# Patient Record
Sex: Male | Born: 1935 | Race: White | Hispanic: No | Smoking: Former smoker
Health system: Southern US, Community
[De-identification: ages and names within clinical notes are randomized; demographics above are authoritative.]

## PROBLEM LIST (undated history)

## (undated) DIAGNOSIS — I639 Cerebral infarction, unspecified: Secondary | ICD-10-CM

## (undated) DIAGNOSIS — I10 Essential (primary) hypertension: Secondary | ICD-10-CM

## (undated) DIAGNOSIS — F32A Depression, unspecified: Secondary | ICD-10-CM

## (undated) DIAGNOSIS — B019 Varicella without complication: Secondary | ICD-10-CM

## (undated) DIAGNOSIS — R0600 Dyspnea, unspecified: Secondary | ICD-10-CM

## (undated) DIAGNOSIS — I714 Abdominal aortic aneurysm, without rupture, unspecified: Secondary | ICD-10-CM

## (undated) DIAGNOSIS — E039 Hypothyroidism, unspecified: Secondary | ICD-10-CM

## (undated) DIAGNOSIS — I2510 Atherosclerotic heart disease of native coronary artery without angina pectoris: Secondary | ICD-10-CM

## (undated) DIAGNOSIS — F329 Major depressive disorder, single episode, unspecified: Secondary | ICD-10-CM

## (undated) DIAGNOSIS — J439 Emphysema, unspecified: Secondary | ICD-10-CM

## (undated) DIAGNOSIS — E7800 Pure hypercholesterolemia, unspecified: Secondary | ICD-10-CM

## (undated) DIAGNOSIS — I350 Nonrheumatic aortic (valve) stenosis: Secondary | ICD-10-CM

## (undated) DIAGNOSIS — M1990 Unspecified osteoarthritis, unspecified site: Secondary | ICD-10-CM

## (undated) HISTORY — PX: CORONARY ANGIOPLASTY WITH STENT PLACEMENT: SHX49

## (undated) HISTORY — DX: Atherosclerotic heart disease of native coronary artery without angina pectoris: I25.10

## (undated) HISTORY — DX: Essential (primary) hypertension: I10

## (undated) HISTORY — DX: Hypothyroidism, unspecified: E03.9

## (undated) HISTORY — DX: Cerebral infarction, unspecified: I63.9

## (undated) HISTORY — DX: Pure hypercholesterolemia, unspecified: E78.00

## (undated) HISTORY — DX: Unspecified osteoarthritis, unspecified site: M19.90

## (undated) HISTORY — DX: Dyspnea, unspecified: R06.00

## (undated) HISTORY — DX: Emphysema, unspecified: J43.9

## (undated) HISTORY — DX: Depression, unspecified: F32.A

## (undated) HISTORY — DX: Major depressive disorder, single episode, unspecified: F32.9

## (undated) HISTORY — DX: Varicella without complication: B01.9

---

## 1999-05-07 ENCOUNTER — Encounter: Payer: Self-pay | Admitting: Internal Medicine

## 1999-05-07 ENCOUNTER — Inpatient Hospital Stay (HOSPITAL_COMMUNITY): Admission: EM | Admit: 1999-05-07 | Discharge: 1999-05-10 | Payer: Self-pay | Admitting: Emergency Medicine

## 1999-11-07 ENCOUNTER — Inpatient Hospital Stay (HOSPITAL_COMMUNITY): Admission: EM | Admit: 1999-11-07 | Discharge: 1999-11-09 | Payer: Self-pay | Admitting: Emergency Medicine

## 1999-11-07 ENCOUNTER — Encounter: Payer: Self-pay | Admitting: Emergency Medicine

## 1999-11-14 ENCOUNTER — Emergency Department (HOSPITAL_COMMUNITY): Admission: EM | Admit: 1999-11-14 | Discharge: 1999-11-14 | Payer: Self-pay | Admitting: Emergency Medicine

## 1999-11-15 ENCOUNTER — Inpatient Hospital Stay (HOSPITAL_COMMUNITY): Admission: EM | Admit: 1999-11-15 | Discharge: 1999-11-18 | Payer: Self-pay | Admitting: Emergency Medicine

## 1999-11-15 ENCOUNTER — Encounter: Payer: Self-pay | Admitting: Emergency Medicine

## 2000-12-23 ENCOUNTER — Encounter: Admission: RE | Admit: 2000-12-23 | Discharge: 2000-12-23 | Payer: Self-pay | Admitting: Family Medicine

## 2000-12-23 ENCOUNTER — Encounter: Payer: Self-pay | Admitting: Family Medicine

## 2001-08-11 ENCOUNTER — Encounter: Admission: RE | Admit: 2001-08-11 | Discharge: 2001-08-11 | Payer: Self-pay | Admitting: Family Medicine

## 2001-08-11 ENCOUNTER — Encounter: Payer: Self-pay | Admitting: Family Medicine

## 2001-08-18 ENCOUNTER — Encounter: Payer: Self-pay | Admitting: Family Medicine

## 2001-08-18 ENCOUNTER — Encounter: Admission: RE | Admit: 2001-08-18 | Discharge: 2001-08-18 | Payer: Self-pay | Admitting: Family Medicine

## 2003-06-09 ENCOUNTER — Inpatient Hospital Stay (HOSPITAL_COMMUNITY): Admission: EM | Admit: 2003-06-09 | Discharge: 2003-06-14 | Payer: Self-pay | Admitting: Emergency Medicine

## 2003-06-09 ENCOUNTER — Encounter: Payer: Self-pay | Admitting: Emergency Medicine

## 2003-06-13 ENCOUNTER — Encounter: Payer: Self-pay | Admitting: Cardiology

## 2003-06-29 ENCOUNTER — Ambulatory Visit (HOSPITAL_COMMUNITY): Admission: RE | Admit: 2003-06-29 | Discharge: 2003-06-29 | Payer: Self-pay | Admitting: Internal Medicine

## 2003-09-27 ENCOUNTER — Encounter: Admission: AD | Admit: 2003-09-27 | Discharge: 2003-09-27 | Payer: Self-pay | Admitting: Dentistry

## 2003-10-03 ENCOUNTER — Ambulatory Visit (HOSPITAL_COMMUNITY): Admission: RE | Admit: 2003-10-03 | Discharge: 2003-10-03 | Payer: Self-pay | Admitting: Cardiology

## 2003-10-12 ENCOUNTER — Ambulatory Visit (HOSPITAL_COMMUNITY): Admission: RE | Admit: 2003-10-12 | Discharge: 2003-10-12 | Payer: Self-pay | Admitting: Dentistry

## 2005-05-05 ENCOUNTER — Ambulatory Visit: Payer: Self-pay | Admitting: Cardiology

## 2005-07-01 ENCOUNTER — Ambulatory Visit: Payer: Self-pay | Admitting: *Deleted

## 2005-07-18 ENCOUNTER — Encounter (INDEPENDENT_AMBULATORY_CARE_PROVIDER_SITE_OTHER): Payer: Self-pay | Admitting: *Deleted

## 2005-07-18 LAB — CONVERTED CEMR LAB
ALT: 14 units/L
AST: 25 units/L
Albumin: 4.3 g/dL
Alkaline Phosphatase: 71 units/L
Total Protein: 7.6 g/dL
Triglycerides: 112 mg/dL

## 2006-03-10 ENCOUNTER — Emergency Department (HOSPITAL_COMMUNITY): Admission: EM | Admit: 2006-03-10 | Discharge: 2006-03-10 | Payer: Self-pay | Admitting: Family Medicine

## 2006-05-01 ENCOUNTER — Encounter: Payer: Self-pay | Admitting: Cardiology

## 2006-05-12 ENCOUNTER — Ambulatory Visit: Payer: Self-pay | Admitting: Cardiology

## 2006-05-14 ENCOUNTER — Ambulatory Visit (HOSPITAL_COMMUNITY): Admission: RE | Admit: 2006-05-14 | Discharge: 2006-05-14 | Payer: Self-pay | Admitting: Cardiology

## 2006-05-14 ENCOUNTER — Ambulatory Visit: Payer: Self-pay | Admitting: Cardiology

## 2006-10-20 ENCOUNTER — Ambulatory Visit: Payer: Self-pay | Admitting: Cardiology

## 2009-02-22 DIAGNOSIS — I2510 Atherosclerotic heart disease of native coronary artery without angina pectoris: Secondary | ICD-10-CM | POA: Insufficient documentation

## 2009-02-22 DIAGNOSIS — R0602 Shortness of breath: Secondary | ICD-10-CM | POA: Insufficient documentation

## 2009-02-22 DIAGNOSIS — I6350 Cerebral infarction due to unspecified occlusion or stenosis of unspecified cerebral artery: Secondary | ICD-10-CM | POA: Insufficient documentation

## 2009-02-22 DIAGNOSIS — E039 Hypothyroidism, unspecified: Secondary | ICD-10-CM | POA: Insufficient documentation

## 2009-02-22 DIAGNOSIS — E7800 Pure hypercholesterolemia, unspecified: Secondary | ICD-10-CM | POA: Insufficient documentation

## 2009-08-09 ENCOUNTER — Encounter: Payer: Self-pay | Admitting: Cardiology

## 2009-08-10 ENCOUNTER — Encounter: Payer: Self-pay | Admitting: Cardiology

## 2009-08-13 ENCOUNTER — Encounter (INDEPENDENT_AMBULATORY_CARE_PROVIDER_SITE_OTHER): Payer: Self-pay | Admitting: *Deleted

## 2009-08-13 ENCOUNTER — Ambulatory Visit (HOSPITAL_COMMUNITY): Admission: RE | Admit: 2009-08-13 | Discharge: 2009-08-13 | Payer: Self-pay | Admitting: Cardiovascular Disease

## 2009-08-13 ENCOUNTER — Ambulatory Visit: Payer: Self-pay | Admitting: Cardiovascular Disease

## 2009-08-13 DIAGNOSIS — I739 Peripheral vascular disease, unspecified: Secondary | ICD-10-CM | POA: Insufficient documentation

## 2009-08-13 DIAGNOSIS — I359 Nonrheumatic aortic valve disorder, unspecified: Secondary | ICD-10-CM | POA: Insufficient documentation

## 2009-08-13 DIAGNOSIS — R0989 Other specified symptoms and signs involving the circulatory and respiratory systems: Secondary | ICD-10-CM | POA: Insufficient documentation

## 2009-08-17 ENCOUNTER — Encounter (INDEPENDENT_AMBULATORY_CARE_PROVIDER_SITE_OTHER): Payer: Self-pay | Admitting: *Deleted

## 2009-08-17 ENCOUNTER — Encounter: Payer: Self-pay | Admitting: Cardiovascular Disease

## 2009-08-17 ENCOUNTER — Ambulatory Visit: Payer: Self-pay | Admitting: Cardiology

## 2009-08-17 ENCOUNTER — Ambulatory Visit (HOSPITAL_COMMUNITY): Admission: RE | Admit: 2009-08-17 | Discharge: 2009-08-17 | Payer: Self-pay | Admitting: Cardiovascular Disease

## 2009-08-17 LAB — CONVERTED CEMR LAB
CO2: 31 meq/L
Calcium: 9.7 mg/dL
Creatinine, Ser: 1.19 mg/dL
Glucose, Bld: 76 mg/dL

## 2009-08-22 ENCOUNTER — Inpatient Hospital Stay (HOSPITAL_BASED_OUTPATIENT_CLINIC_OR_DEPARTMENT_OTHER): Admission: RE | Admit: 2009-08-22 | Discharge: 2009-08-22 | Payer: Self-pay | Admitting: Cardiovascular Disease

## 2009-08-22 ENCOUNTER — Ambulatory Visit: Payer: Self-pay | Admitting: Cardiovascular Disease

## 2009-08-28 ENCOUNTER — Telehealth (INDEPENDENT_AMBULATORY_CARE_PROVIDER_SITE_OTHER): Payer: Self-pay | Admitting: *Deleted

## 2009-09-11 ENCOUNTER — Telehealth (INDEPENDENT_AMBULATORY_CARE_PROVIDER_SITE_OTHER): Payer: Self-pay | Admitting: *Deleted

## 2009-09-13 ENCOUNTER — Telehealth (INDEPENDENT_AMBULATORY_CARE_PROVIDER_SITE_OTHER): Payer: Self-pay

## 2009-09-14 ENCOUNTER — Ambulatory Visit: Payer: Self-pay | Admitting: Cardiovascular Disease

## 2009-09-14 ENCOUNTER — Observation Stay (HOSPITAL_COMMUNITY): Admission: AD | Admit: 2009-09-14 | Discharge: 2009-09-15 | Payer: Self-pay | Admitting: Cardiovascular Disease

## 2009-09-21 ENCOUNTER — Encounter (INDEPENDENT_AMBULATORY_CARE_PROVIDER_SITE_OTHER): Payer: Self-pay | Admitting: *Deleted

## 2009-09-25 ENCOUNTER — Telehealth (INDEPENDENT_AMBULATORY_CARE_PROVIDER_SITE_OTHER): Payer: Self-pay | Admitting: *Deleted

## 2009-10-01 ENCOUNTER — Encounter (INDEPENDENT_AMBULATORY_CARE_PROVIDER_SITE_OTHER): Payer: Self-pay | Admitting: *Deleted

## 2009-10-01 LAB — CONVERTED CEMR LAB
BUN: 13 mg/dL
CO2: 31 meq/L
Calcium: 9.7 mg/dL
Creatinine, Ser: 1.19 mg/dL
Glucose, Bld: 76 mg/dL

## 2009-10-05 ENCOUNTER — Encounter (INDEPENDENT_AMBULATORY_CARE_PROVIDER_SITE_OTHER): Payer: Self-pay | Admitting: *Deleted

## 2009-10-24 ENCOUNTER — Ambulatory Visit: Payer: Self-pay | Admitting: Cardiovascular Disease

## 2010-09-08 DIAGNOSIS — I350 Nonrheumatic aortic (valve) stenosis: Secondary | ICD-10-CM

## 2010-09-08 HISTORY — DX: Nonrheumatic aortic (valve) stenosis: I35.0

## 2010-09-17 ENCOUNTER — Encounter: Payer: Self-pay | Admitting: Cardiovascular Disease

## 2010-09-18 ENCOUNTER — Ambulatory Visit: Admit: 2010-09-18 | Payer: Self-pay

## 2010-09-18 ENCOUNTER — Ambulatory Visit (HOSPITAL_COMMUNITY): Admission: RE | Admit: 2010-09-18 | Payer: Self-pay | Source: Home / Self Care | Admitting: Cardiovascular Disease

## 2010-10-10 NOTE — Miscellaneous (Signed)
Summary: LABS BMP,PT,INR 08/17/09  Clinical Lists Changes  Observations: Added new observation of CALCIUM: 9.7 mg/dL (03/47/4259 56:38) Added new observation of CREATININE: 1.19 mg/dL (75/64/3329 51:88) Added new observation of BUN: 13 mg/dL (41/66/0630 16:01) Added new observation of BG RANDOM: 76 mg/dL (09/32/3557 32:20) Added new observation of CO2 PLSM/SER: 31 meq/L (10/01/2009 11:59) Added new observation of CL SERUM: 103 meq/L (10/01/2009 11:59) Added new observation of K SERUM: 4.5 meq/L (10/01/2009 11:59) Added new observation of NA: 140 meq/L (10/01/2009 11:59) Added new observation of INR: 1.08  (10/01/2009 11:59) Added new observation of PT PATIENT: 13.9 s (10/01/2009 11:59) Added new observation of PTT PATIENT: 28 s (10/01/2009 11:59)

## 2010-10-10 NOTE — Letter (Signed)
Summary: Generic Letter  Architectural technologist at [facility]  [address] [place] [address]   Phone: [phone]  Fax: [phone]        September 21, 2009 MRN: [id number]    Maxwell Copeland [address] [place], [place]  [address]   To Whom It May Concern:        Mr. Dommer underwent a cardiac catherization on January 8th.  He is status post elective cutting balloon angioplasty/drug eluting stenting of proximal circumflex artery, secondary to instent restenosis. He will need plavix therapy for at least 18-24 months or longer depending on his progress.         Sincerely,  Dr. Reese Bing  This letter has been electronically signed by your physician.

## 2010-10-10 NOTE — Progress Notes (Signed)
Summary: Questions  Phone Note Call from Patient Call back at [phone]   Caller: Daughter (Maxwell Copeland) Reason for Call: Talk to Nurse Summary of Call: Needs RX for [facility] and [facility] in [facility] why [facility] is [facility]/ Pt's daughter has concern about him having balloon surgery on Friday and has not had his Plavix for about 5 or 6 days/pls return call to patient's daughter @ above phone number/tg  Patient daughter calling again today needs to get a copy of instructions that were giving to her for his balloon surgery. She doesnt know were to go or who is doing surgery. Please call her back today. Initial call taken by: Raechel Ache [facility],  September 11, 2009 2:19 PM  Follow-up for Phone Call        Dr. Eden Emms  I have previously sent the office note but apparently they want a detailed letter from you. Thank you for attending to this matter Follow-up by: Teressa Lower RN,  September 12, 2009 9:37 AM

## 2010-10-10 NOTE — Progress Notes (Signed)
**Note De-Identified Maxwell Copeland Obfuscation** Summary: Plavix  Phone Note Other Incoming   Summary of Call: No answer, left message. (needs to be advised to restart Plavix.) Initial call taken by: Larita Fife Havilah Topor LPN,  September 13, 2009 9:20 AM  Follow-up for Phone Call        No answer,left message. Follow-up by: Larita Fife Kiearra Oyervides LPN,  September 14, 2009 9:02 AM    Additional Follow-up for Phone Call Additional follow up Details #2::    I called June, patient's daughter @ [phone], to give Plavix instructions and was advised that Dr. Earmon [place] office had already contacted her on this matter. Patient is having surgery today at 12:30. Follow-up by: Larita Fife Katilin Raynes LPN,  September 14, 2009 10:21 AM

## 2010-10-10 NOTE — Progress Notes (Signed)
Summary: [facility] paperwork  Phone Note Call from Patient Call back at [phone]   Caller: pt daughter june Reason for Call: Talk to Nurse Summary of Call: Calling because [facility] is still giving her run around about her dads paper work. She was talking with Erskin Zinda and want s to know if she will re-fax all information again Initial call taken by: Faythe Ghee,  September 25, 2009 12:54 PM  Follow-up for Phone Call        [facility] and also faxed to pt's daughter Follow-up by: Teressa Lower RN,  September 25, 2009 1:39 PM    Prescriptions: PLAVIX 75 MG TABS (CLOPIDOGREL BISULFATE) take 1 tablet by mouth once daily  #90 x 3   Entered by:   Teressa Lower RN   Authorized by:   Kathlen Brunswick, MD, [facility]   Signed by:   Teressa Lower RN on 09/25/2009   Method used:   Print then Give to Patient   RxID:   [id number]

## 2010-10-10 NOTE — Miscellaneous (Signed)
Summary: labs pt,inr,bmp,08/17/09  Clinical Lists Changes  Observations: Added new observation of CALCIUM: 9.7 mg/dL (81/19/1478 29:56) Added new observation of CREATININE: 1.19 mg/dL (21/30/8657 84:69) Added new observation of BUN: 13 mg/dL (62/95/2841 32:44) Added new observation of BG RANDOM: 76 mg/dL (09/10/7251 66:44) Added new observation of CO2 PLSM/SER: 31 meq/L (08/17/2009 12:59) Added new observation of CL SERUM: 103 meq/L (08/17/2009 12:59) Added new observation of K SERUM: 4.5 meq/L (08/17/2009 12:59) Added new observation of NA: 140 meq/L (08/17/2009 12:59) Added new observation of INR: 1.08  (08/17/2009 12:59) Added new observation of PT PATIENT: 13.9 s (08/17/2009 12:59) Added new observation of PTT PATIENT: 28 s (08/17/2009 12:59)

## 2010-10-10 NOTE — Assessment & Plan Note (Signed)
Summary: EPH-POST STENT   Visit Type:  Follow-up Primary Provider:  Dr.Shaw   History of Present Illness: Maxwell Copeland is seen today for F/U of carotid disease, CAD and AS.  He just had reintervention to the circ by Dr Excell Seltzer.  He has moderate AS by echo but only mild at cath.  He has known 40-59% LICA stenosis and needs F/U echo and duplex in November.  He is not having any SSCP.  He needs a script for nitro.  He has limitied activity.  He doesn't smoke but chews Bon[place] Dol[place].  He is compliant with his meds including ASA and Plavix.  Current Problems (verified): 1)  Aortic Valve Disorders  (ICD-424.1) 2)  Carotid Bruit  (ICD-785.9) 3)  Preoperative Examination  (ICD-V72.84) 4)  Pvd  (ICD-443.9) 5)  Cad, Unspecified Site  (ICD-414.00) 6)  Hypercholesterolemia Iia  (ICD-272.0) 7)  Dyspnea  (ICD-786.05) 8)  Hypothyroidism  (ICD-244.9) 9)  Cva  (ICD-434.91)  Current Medications (verified): 1)  Slo-Niacin 500 Mg Cr-Tabs (Niacin) .... Take 1 Tab Daily 2)  Crestor 40 Mg Tabs (Rosuvastatin Calcium) .... Take 1 Tab Daily 3)  Amlodipine Besylate 10 Mg Tabs (Amlodipine Besylate) .... Take 1 Tab Daily 4)  Metoprolol Tartrate 50 Mg Tabs (Metoprolol Tartrate) .... Take 1 Tab Daily 5)  Prilosec 20 Mg Cpdr (Omeprazole) .... Take 1 Tab Daily 6)  Levothroid 112 Mcg Tabs (Levothyroxine Sodium) .... Take 1 Tab Daily 7)  Aspir-Trin 325 Mg Tbec (Aspirin) .... Take 1 Tab Daily 8)  Vitamin E 16109 Unit/ml Oil (Vitamin E) .... Take 1 Tab Daily 9)  Plavix 75 Mg Tabs (Clopidogrel Bisulfate) .... 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  - 40+ yrs pack daily quit about '89 Alcohol Use - yes - occasional Drug Use - no  Review of Systems       Denies fever, malais, weight loss, blurry vision, decreased visual acuity, cough, sputum, SOB, hemoptysis, pleuritic pain, palpitaitons, heartburn, abdominal pain, melena, lower extremity edema, claudication, or rash. All other systems reviewed and negative  Vital Signs:  Patient profile:   77 year old male Weight:      159 pounds Pulse rate:   58 / minute BP sitting:   126 / 75  (right arm)  Vitals Entered By: Dreama Saa, CNA (October 24, 2009 2:30 PM)  Physical Exam  General:  Affect appropriate Healthy:  appears stated age HEENT: normal Neck supple with no adenopathy JVP normalright  bruits no thyromegaly Lungs clear with no wheezing and good diaphragmatic motion Heart:  S1/S2 AS  murmur, no rub, gallop or click PMI normal Abdomen: benighn, BS positve, no tenderness, no AAA no bruit.  No HSM or HJR Distal pulses intact with bilateral femoral bruits No edema Neuro non-focal Skin warm and dry    Impression & Recommendations:  Problem # 1:  CAD, UNSPECIFIED SITE (ICD-414.00)  S/P recent stent ot circ.  Films reviewed.  good result with moderate residual LAD and RCA disease His updated medication list for this problem includes:    Amlodipine Besylate 10 Mg Tabs (Amlodipine besylate) .Marland Kitchen... Take 1 tab daily  Metoprolol Tartrate 50 Mg Tabs (Metoprolol tartrate) .Marland Kitchen... Take 1 tab daily    Aspir-trin 325 Mg Tbec (Aspirin) .Marland Kitchen... Take 1 tab daily    Plavix 75 Mg Tabs (Clopidogrel bisulfate) .Marland Kitchen... Take 1 tablet by mouth once daily  His updated medication list for this problem includes:    Amlodipine Besylate 10 Mg Tabs (Amlodipine besylate) .Marland Kitchen... Take 1 tab daily    Metoprolol Tartrate 50 Mg Tabs (Metoprolol tartrate) .Marland Kitchen... Take 1 tab daily    Aspir-trin 325 Mg Tbec (Aspirin) .Marland Kitchen... Take  1 tab daily    Plavix 75 Mg Tabs (Clopidogrel bisulfate) .Marland Kitchen... Take 1 tablet by mouth once daily  Problem # 2:  AORTIC VALVE DISORDERS (ICD-424.1) F/U echo in November.  Gradient only 17mm Hg at cath His updated medication list for this problem includes:    Metoprolol Tartrate 50 Mg Tabs (Metoprolol tartrate) .Marland Kitchen... Take 1 tab daily  Problem # 3:  CAROTID BRUIT (ICD-785.9) No TIA sysmptoms.  F/U duplex for known right sided disease in November  Problem # 4:  PVD (ICD-443.9) Bilateral femoral bruits with mild claudication.  Walking program.  No Pletal for now given CAD and recent stent  Other Orders: Carotid Duplex (Carotid Duplex)  Patient Instructions: 1)  Your physician recommends that you schedule a follow-up appointment in: November 2011, tests prior to appt in November 2)  Your physician has requested that you have a carotid duplex. This test is an ultrasound of the carotid arteries in your neck. It looks at blood flow through these arteries that supply the brain with blood. Allow one hour for this exam. There are no restrictions or special instructions. 3)  Your physician has requested that you have an echocardiogram.  Echocardiography is a painless test that uses sound waves to create images of your heart. It provides your doctor with information about the size and shape of your heart and how well your heart's chambers and valves are working.  This procedure takes approximately one hour. There are no restrictions for this procedure.  Appended Document: Orders Update    Clinical Lists Changes  Orders: Added new Referral order of Carotid Duplex (Carotid Duplex) - Signed Added new Referral order of 2-D Echocardiogram (2D Echo) - Signed      Appended Document: [facility]     Allergies: No Known Drug Allergies   Patient Instructions: 1)  Your physician recommends that you schedule a follow-up appointment in: FOLLOW UP IN NOVEMBER IN [place] OFFICE 2)  PT TO  HAVE [facility] AND CAROTID DUPLEX THE MORNING OF APPT

## 2010-10-10 NOTE — Miscellaneous (Signed)
Summary: Orders Update  Clinical Lists Changes  Orders: Added new Test order of Carotid Duplex (Carotid Duplex) - Signed 

## 2010-11-01 ENCOUNTER — Encounter (INDEPENDENT_AMBULATORY_CARE_PROVIDER_SITE_OTHER): Payer: Medicare Other

## 2010-11-01 ENCOUNTER — Ambulatory Visit (INDEPENDENT_AMBULATORY_CARE_PROVIDER_SITE_OTHER): Payer: Medicare Other | Admitting: Cardiovascular Disease

## 2010-11-01 ENCOUNTER — Encounter: Payer: Self-pay | Admitting: Cardiovascular Disease

## 2010-11-01 ENCOUNTER — Ambulatory Visit (HOSPITAL_COMMUNITY): Payer: Medicare Other | Attending: Cardiology

## 2010-11-01 DIAGNOSIS — I6529 Occlusion and stenosis of unspecified carotid artery: Secondary | ICD-10-CM

## 2010-11-01 DIAGNOSIS — I080 Rheumatic disorders of both mitral and aortic valves: Secondary | ICD-10-CM | POA: Insufficient documentation

## 2010-11-01 DIAGNOSIS — R0989 Other specified symptoms and signs involving the circulatory and respiratory systems: Secondary | ICD-10-CM

## 2010-11-01 DIAGNOSIS — I2510 Atherosclerotic heart disease of native coronary artery without angina pectoris: Secondary | ICD-10-CM | POA: Insufficient documentation

## 2010-11-01 DIAGNOSIS — I359 Nonrheumatic aortic valve disorder, unspecified: Secondary | ICD-10-CM

## 2010-11-01 DIAGNOSIS — I252 Old myocardial infarction: Secondary | ICD-10-CM | POA: Insufficient documentation

## 2010-11-05 NOTE — Assessment & Plan Note (Signed)
Summary: ROV,ECHO CAROTID SAME DAY R.S.PER PT DTR JUNE CALL.GD UNABLE .Marland KitchenMarland Kitchen   Primary Provider:  Dr.Shaw   History of Present Illness: Maxwell Copeland is seen today for F/U of carotid disease, CAD and AS.  Maxwell Copeland  had reintervention to the circ by Dr Excell Seltzer.in 2011   Maxwell Copeland has moderate AS by echo but only mild at cath.  Maxwell Copeland has known 40-59% LICA stenosis a  Maxwell Copeland is not having any SSCP.  Maxwell Copeland needs a script for nitro.  Maxwell Copeland has limitied activity.  Maxwell Copeland doesn't smoke but chews [place].  Maxwell Copeland is compliant with his meds including ASA and Plavix.  Reviewed duplex from today. RICA 60-79% stable.  Will have echo after visit Discussed issues with progressive AS at his age.  Not sure Maxwell Copeland is a great AVR candidate and discussed possiblity of TAVR  SOB when walking only when pace is too high.  No SSCP or palpitaitons  Current Problems (verified): 1)  Aortic Valve Disorders  (ICD-424.1) 2)  Carotid Bruit  (ICD-785.9) 3)  Preoperative Examination  (ICD-V72.84) 4)  Pvd  (ICD-443.9) 5)  Cad, Unspecified Site  (ICD-414.00) 6)  Hypercholesterolemia Iia  (ICD-272.0) 7)  Dyspnea  (ICD-786.05) 8)  Hypothyroidism  (ICD-244.9) 9)  Cva  (ICD-434.91)  Current Medications (verified): 1)  Slo-Niacin 500 Mg Cr-Tabs (Niacin) .... Take 1 Tab Daily 2)  Crestor 40 Mg Tabs (Rosuvastatin Calcium) .... Take 1 Tab Daily 3)  Amlodipine Besylate 10 Mg Tabs (Amlodipine Besylate) .... Take 1 Tab Daily 4)  Metoprolol Tartrate 50 Mg Tabs (Metoprolol Tartrate) .... Take 1 Tab Daily 5)  Prilosec 20 Mg Cpdr (Omeprazole) .... Take 1 Tab Daily 6)  Levothroid 112 Mcg Tabs (Levothyroxine Sodium) .... Take 1 Tab Daily 7)  Aspir-Trin 325 Mg Tbec (Aspirin) .... Take 1 Tab Daily 8)  Vitamin E 16109 Unit/ml Oil (Vitamin E) .... Take 1 Tab Daily 9)  Plavix 75 Mg Tabs (Clopidogrel Bisulfate) .... 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  - 40+ yrs pack daily quit about '89 Alcohol Use - yes - occasional Drug Use - no  Review of Systems       Denies fever, malais, weight loss, blurry vision, decreased visual acuity, cough, sputum, , hemoptysis, pleuritic pain, palpitaitons, heartburn, abdominal pain, melena, lower extremity edema, claudication, or rash.   Vital Signs:  Patient profile:   77 year old male Height:      68 inches Weight:      155 pounds BMI:     23.65 Pulse rate:   69 / minute Resp:     14 per minute BP sitting:   127 / 70  (left arm)  Vitals Entered By: Maxwell Copeland (November 01, 2010 3:46 PM)  Physical Exam  General:  Affect appropriate Healthy:  appears stated age HEENT: normal Neck supple with no adenopathy JVP normal bilateral bruits no thyromegaly Lungs clear with no wheezing and good diaphragmatic motion Heart:  S1/S2 AS  murmur,rub, gallop or click PMI normal Abdomen: benighn, BS positve, no tenderness, no AAA no bruit.  No HSM or HJR Distal pulses intact with no bruits No edema  Neuro non-focal Skin warm and dry    Impression & Recommendations:  Problem # 1:  AORTIC VALVE DISORDERS (ICD-424.1) Echo today discussed issues of AVR and TAVR His updated medication list for this problem includes:    Metoprolol Tartrate 50 Mg Tabs (Metoprolol tartrate) .Marland Kitchen... Take 1 tab daily  Problem # 2:  CAROTID BRUIT (ICD-785.9) Stable 60-79% RICA duplex in 6 months  Problem # 3:  CAD, UNSPECIFIED SITE (ICD-414.00) Stable no angina His updated medication list for this problem includes:    Amlodipine Besylate 10 Mg Tabs (Amlodipine besylate) .Marland Kitchen... Take 1  tab daily    Metoprolol Tartrate 50 Mg Tabs (Metoprolol tartrate) .Marland Kitchen... Take 1 tab daily    Aspir-trin 325 Mg Tbec (Aspirin) .Marland Kitchen... Take 1 tab daily    Plavix 75 Mg Tabs (Clopidogrel bisulfate) .Marland Kitchen... Take 1 tablet by mouth once daily  Problem # 4:  HYPERCHOLESTEROLEMIA  IIA (ICD-272.0) Continue agressive Rx given vascular disease His updated medication list for this problem includes:    Slo-niacin 500 Mg Cr-tabs (Niacin) .Marland Kitchen... Take 1 tab daily    Crestor 40 Mg Tabs (Rosuvastatin calcium) .Marland Kitchen... Take 1 tab daily  Patient Instructions: 1)  Your physician wants you to follow-up in: 6 months  You will receive a reminder letter in the mail two months in advance. If you don't receive a letter, please call our office to schedule the follow-up appointment.   EKG Report  Procedure date:  11/01/2010  Findings:      NSR 63 Occasional PVC Otherwise normal

## 2010-11-24 LAB — BASIC METABOLIC PANEL
BUN: 17 mg/dL (ref 6–23)
Calcium: 8.7 mg/dL (ref 8.4–10.5)
Creatinine, Ser: 1.06 mg/dL (ref 0.4–1.5)
Creatinine, Ser: 1.17 mg/dL (ref 0.4–1.5)
GFR calc Af Amer: >60 mL/min (ref >60)
GFR calc non Af Amer: >60 mL/min (ref >60)
GFR calc non Af Amer: >60 mL/min (ref >60)
Glucose, Bld: 89 mg/dL (ref 70–99)
Sodium: 136 mEq/L (ref 135–145)

## 2010-11-24 LAB — CBC
Hemoglobin: 10.2 g/dL — ABNORMAL LOW (ref 13.0–17.0)
MCV: 87.3 fL (ref 78.0–100.0)
Platelets: 156 10*3/uL (ref 150–400)
Platelets: 201 10*3/uL (ref 150–400)
RDW: 13.3 % (ref 11.5–15.5)
RDW: 13.8 % (ref 11.5–15.5)
WBC: 7.4 10*3/uL (ref 4.0–10.5)

## 2010-11-24 LAB — PROTIME-INR: Prothrombin Time: 15.3 seconds — ABNORMAL HIGH (ref 11.6–15.2)

## 2010-12-03 ENCOUNTER — Other Ambulatory Visit: Payer: Self-pay | Admitting: Pharmacist

## 2010-12-03 MED ORDER — NITROGLYCERIN 0.4 MG SL SUBL: 0.4000 mg | SUBLINGUAL_TABLET | SUBLINGUAL | Status: DC | PRN

## 2011-01-01 ENCOUNTER — Inpatient Hospital Stay (INDEPENDENT_AMBULATORY_CARE_PROVIDER_SITE_OTHER)
Admission: RE | Admit: 2011-01-01 | Discharge: 2011-01-01 | Disposition: A | Discharge status: Home / Self Care | Payer: Medicare Other | Source: Ambulatory Visit

## 2011-01-01 DIAGNOSIS — J40 Bronchitis, not specified as acute or chronic: Secondary | ICD-10-CM

## 2011-01-24 NOTE — Discharge Summary (Signed)
. [facility]  Patient:    Maxwell Copeland, Maxwell Copeland                   MRN: [id number] Adm. Date:  [id number] Disc. Date: 11/09/99 Attending:  Nelta Numbers Dictator:   Dian Queen, P.A.C. [facility] CC:         Dr. Arvilla Market in [place]             Dr. Dietrich Pates in [place]                  Referring Physician Discharge [place]  HISTORY OF PRESENT ILLNESS:  Essentially, the problem is that of a 77 year old former smoker with hypertension, hyperlipoproteinemia, alcohol drinker, who had  a ramus branch stent in 1997, and then a circumflex stent in 2000.  His ejection fraction is preserved.  He has a chronic total right with collaterals.  He was at work as a Electrical engineer.  He went outside and suddenly became weak all over.  He states that he was numb and could not move for 30 minutes.  He took a few snorts of vodka and felt better.  He had no chest pains.  Symptoms are very vague and difficult to ascertain, but nonetheless, he states were similar to his prior ischemic symptoms, which likewise were vague and nondescript.  ACCESSORY CLINICAL FINDINGS:  Hemoglobin 11.2 with hematocrit 33, and a white count of 5800.  Electrolytes and renal function totally within normal limits.  LFTs were normal.  CKs were low with negative MBs.  Troponins were low.  COURSE IN THE HOSPITAL:  Patient was seen in consultation by neurology.  He had  a negative workup, save for some bilateral periventricular small vessel disease. There was no brain stem disease.  His MRA revealed some small ectasia of the vertebrobasilar system.  Carotid Dopplers revealed no significant right ICA stenosis.  He did have a 40-60% left ICA stenosis.  He had no arrhythmias.  We recommended a rest/stress Cardiolite to screen him for ischemia, but he declined such and wanted to go home.  He wants to have this done as an outpatient.  He is aware of the risks of such.  IMPRESSION: 1.  Coronary artery disease with multiple prior percutaneous interventions,    presenting on this occasion with an episode of presyncope and "numb all    over."  No precise etiology was ascertained.  Symptoms were somewhat    reminiscent of his prior ischemic symptoms, which likewise were atypical.    Patient declined inpatient Cardiolite evaluation. 2. Left internal carotid artery stenosis 40-60% and MRI evidence of bilateral    periventricular small vessel disease.  Otherwise, negative neurological    evaluation this admission. 3. Controlled hypertension. 4. Treated hyperlipoproteinemia. 5. Degenerative disk disease. 6. Alcohol abuse.  DISPOSITION:  Will keep him on the same medications.  MEDICATIONS: 1. Toprol XL 100 q.d. 2. Lescol 40 q.d. 3. Chlorthalidone 12.5 mg q.d. 4. Aspirin 1 daily. 5. P.r.n. nitroglycerin.  FOLLOW-UP:  Will schedule a rest/stress Cardiolite for early next week and he will then see Dr. Dietrich Pates in follow-up.  He does complain of some left-sided hip pain with ambulation.  He does have bilateral femoral bruits but adequate pedal pulses.  We did not get lower extremity Dopplers.  Will defer this evaluation, if need be, to Dr. Dietrich Pates. DD:  11/09/99 TD:  11/09/99 Job: [id number]

## 2011-01-24 NOTE — Procedures (Signed)
NAMEKNOWLEDGE, ESCANDON            ACCOUNT NO.:  [id number]   MEDICAL RECORD NO.:  [id number]          PATIENT TYPE:  OUT   LOCATION:  RAD                           FACILITY:  [facility]   PHYSICIAN:  Gerrit Friends. Dietrich Pates, MD, FACCDATE OF BIRTH:  1932/02/21   DATE OF PROCEDURE:  05/14/2006  DATE OF DISCHARGE:                                  ECHOCARDIOGRAM   CLINICAL DATA:  A 77 year old gentleman with hypertension and aortic  stenosis.  M-mode aorta 3.9, left atrium 4.8, septum 1.3, posterior wall  1.1, LV diastole 4.1, LV systole 3.4.   1. Technically-difficult and somewhat suboptimal echographic study.  2. Mild left atrial enlargement;  normal right atrial size.  3. Right ventricle is grossly normal.  4. Substantial calcification and thickening of the aortic valve leaflets      with mild to moderate stenosis by Doppler criteria;  moderate to marked      impairment in leaflet separation.  5. Aortic root diameter not adequately assessed;  calcification of the      proximal ascending aorta.  6. Mild mitral valve thickening and calcification;  mild to moderate      annular calcification;  mild regurgitation.  7. Normal tricuspid valve.  8. Normal left ventricular size;  mild concentric hypertrophy.  Normal      regional and global function.  9. Normal IVC.  10.Physiologic pericardial effusion.      Gerrit Friends. Dietrich Pates, MD, [facility]  Electronically Signed     RMR/MEDQ  D:  05/14/2006  T:  05/15/2006  Job:  [id number]

## 2011-01-24 NOTE — Consult Note (Signed)
[place]  Patient:    Maxwell, Copeland                   MRN: [id number] Proc. Date: 11/07/99 Adm. Date:  [id number] Attending:  Nelta Numbers CC:         Gerrit Friends. Dietrich Pates, M.D. [facility]             [facility], [address], [place]             Desma Maxim, M.D.                          Consultation Report  HISTORY OF PRESENT ILLNESS:  Maxwell Copeland is a 77 year old right-handed white male, born 01/27/1932, with a history of hypertension and coronary artery disease, status post a stent procedure in the past.  Patient presents to the [facility] Emergency Room tonight for an evaluation of atypical chest pain. Patient was at work as a Electrical engineer.  Patient had gotten up out of a chair after a nap and had started to walk.  Patient suddenly noted the onset of severe dizziness and diaphoresis.  Patient sat down but continued to feel dizzy. Patient noted that he was "slobbering from the mouth" and noted that he could not move ny of his extremities.  Patient does not believe that he passed out and did not remember any double vision and is not sure whether there was any dimming of vision. Patient was looking at a clock and noted that 30 minutes passed when he was unable to move.  Patient felt as if he were aspirating saliva but could not stop himself. Patient was unable to move any of his extremities.  Patient again denied any chest pains or pressure on the chest or palpitations.  Patient denies any feelings of  heart racing.  Patient was eventually able to start moving his hands and eventually was able to stand up.  Patient noted that he was quite dizzy when he stood up and again, was drenched with sweat.  Patient had a full recovery from the above event and has never had similar events in the past.  Patient denied any focal numbness on the arms or legs and again was weak all over during  the event.  Patient called is wife and felt that he was slurring his speech but his wife does not recall this. Neurology was asked to see this patient for an evaluation of the above event.  PAST MEDICAL HISTORY: 1. History of coronary artery disease, status post stent placement. 2. Onset of near syncope, as above. 3. History of hypertension. 4. History of hypercholesterolemia. 5. History of alcohol overuse. 6. History of aortic stenosis by coronary catheterization.  MEDICATIONS PRIOR TO ADMISSION: 1. Toprol XL 100 mg daily. 2. Lescol 40 mg a day. 3. Chlorthalidone 25 mg 1/2 tablet daily. 4. Nitroglycerin p.r.n. 5. Vitamin E tablet. 6. Aspirin daily.  ALLERGIES:  Patient has no known allergies.  SOCIAL HISTORY:  Does not smoke but does chew tobacco and has greater than 4 ounces of alcohol a day.  [place], is married and has four children, all alive and well.  Patient works as a Electrical engineer.   FAMILY MEDICAL HISTORY:  Notable that mother died of "old age" at age 57; had a  history of heart disease and dementia at the time of  death.  Father died of an I; had a history of alcoholism.  Patient has three brothers; all are alive and well. Patient has no sisters.  REVIEW OF SYSTEMS:  No recent fevers or chills; patient did have the flu two weeks ago, however.  Denies any problems controlling bowels or bladder.  Denies any blackout episodes, seizures.  Denies any tongue-biting with the most recent event today.  Patient again had no vomiting; may have had some slight nausea with the  above problem.  PHYSICAL EXAMINATION:  VITALS:  Blood pressure is 132/84.  Heart rate is 62 and regular.  Respiratory ate 12.  Temperature:  Afebrile.  GENERAL:  This patient is a fairly well-developed white male who is alert and cooperative at the time of examination.  HEENT:  Head is atraumatic.  Eyes:  Pupils equal, round and reactive to  light.  Disks are flat bilaterally.  NECK:  Supple.  No carotid bruits noted.  RESPIRATORY:  Clear to auscultation and percussion.  CARDIOVASCULAR:  Regular rate and rhythm without obvious murmurs or rubs.  EXTREMITIES:  Without significant edema.  NEUROLOGIC:  Cranial nerves as above.  Facial symmetry is present.  Patient has  good sensation in the face to pinprick and soft touch bilaterally.  Patient has  good strength of the facial muscles and the muscles of head-turning and shoulder shrugs bilaterally.  Visual fields are full to double simultaneous stimulation.  Speech is well-enunciated and not aphasic.  No evidence of laceration of the tongue is seen.  Tongue again is midline.  Patient has good full strength in all four extremities.  Good and symmetric motor tone is noted throughout.  Sensory testing is intact to pinprick, soft touch and vibratory sensation throughout. Finger-to-nose and finger-to-toe-to-finger are symmetric and normal.  Deep tendon reflexes are symmetric and normal.  Toes are neutral bilaterally.  Again, sensation on the body is intact to pinprick, soft touch and vibratory sensation throughout.  LABORATORY DATA:  EKG reveals normal sinus rhythm; normal EKG.  Heart rate is 62.  Labs are pending at this time.  IMPRESSION: 1. History of near-syncopal event. 2. History of coronary artery disease.  This patient has had a transient episode today of what appears to be near syncope. Patient feels that he remembers all events during that time but likely sustained an event of severe hypotension, for whatever reason.  Do need to consider the possibility of cardiac ischemia or cardiac rhythm abnormality.  I think that a transient ischemic attack-type event is less likely but still possible and could represent a vertebrobasilar insufficiency event.  Doubt this event is a result f a seizure.  I will pursue a bit of workup at this point.  PLAN: 1. MRI  scan of the brain with an MR angiogram of the vertebrobasilar system.  2. Patient will be monitored while in house and will follow clinical house while    in house. DD:  11/07/99 TD:  11/08/99 Job: [id number]

## 2011-01-24 NOTE — Op Note (Signed)
NAME:  Maxwell Copeland, Maxwell Copeland                      ACCOUNT NO.:  [id number]   MEDICAL RECORD NO.:  [id number]                   PATIENT TYPE:  AMB   LOCATION:  DAY                                  FACILITY:  [facility]   PHYSICIAN:  Maxwell Copeland, M.D.              DATE OF BIRTH:  1932/08/20   DATE OF PROCEDURE:  06/29/2003  DATE OF DISCHARGE:                                 OPERATIVE REPORT   PROCEDURE:  Diagnostic EGD followed by colonoscopy with snare polypectomy.   INDICATIONS FOR PROCEDURE:  The patient is a 77 year old gentleman with  recent iron deficiency anemia.  He recently had gross blood per rectum.  Recent past medical history is significant for recent acute coronary  syndrome for which he underwent stent placement.  He was on Plavix and  aspirin and previously on Coumadin.  He has come off all the above  medications.  He has not had any more blood per rectum.  This H&H back on  October 6 was 10.3 and 28.7 at [facility].  On October 12, it was  11.2 and 33.4 with an MCV of 88.2.  On October 21, H&H 11.8 and 34.6.  EGD  and colonoscopy are now being done to further evaluate the gentleman's  recent GI bleed.  This approach has been discussed with the patient  previously.  Potential risks, benefits, and alternatives have been reviewed  and questions answered.  Please see my dictated consultation note of June 20, 2003 for more information.   PROCEDURE NOTE:  O2 saturations, blood pressure, pulse, and respirations  were monitored throughout the entire procedure.   CONSCIOUS SEDATION:  1. Versed 3 mg IV.  2. Demerol 50 mg IV in divided doses.   INSTRUMENT:  Olympus video chip adult gastroscope and colonoscope.   ESOPHAGOGASTRODUODENOSCOPY FINDINGS:  Examination of the tubular esophagus  revealed normal mucosa.  EG junction easily traversed.   STOMACH:  The gastric cavity was emptied, insufflated well with air.  Thorough examination of the gastric mucosa,  including retroflexed view of  the proximal stomach and esophagogastric junction demonstrated two tiny  antral erosions.  Otherwise, the gastric mucosa appeared normal, and pylorus  was patent and easily traversed.   DUODENUM:  The bulb and second portion appeared normal.   THERAPY/DIAGNOSTIC MANEUVERS PERFORMED:  None.   The patient tolerated the procedure well and was prepared for colonoscopy.  Digital rectal examination revealed no abnormalities or endoscopic findings.  The prep was good.   RECTUM:  Examination of the rectal mucosa including retroflexed view of the  anal verge revealed a 1 cm pedunculated polyp at 10 cm in from the anal  verge.  It was friable, angry, and easily bled with minimal manipulation.  Please see photos.   COLON:  Colonic mucosa was surveyed from the rectosigmoid junction through  the left transverse and right colon to the area of the appendiceal orifice,  ileocecal  valve, and cecum.  The patient was noted to have sigmoid  diverticula.  Remainder of colonic mucosa appeared normal from the level of  the cecum, ileocecal valve.  The scope was slowly withdrawn.  All previously  mentioned mucosal surfaces were again seen, and no other abnormalities were  observed.  The polyp in the rectum was resected with the snare.  There was a  small amount of residual polyp tissue in the periphery of the polypectomy  base which was destroyed with the tip of the snare cautery unit.  The polyp  was recovered.  The patient tolerated both procedures well and was reacted  in endoscopy.   IMPRESSION:  1. Normal esophagus.  2. A couple of small antral erosions.  3. Otherwise, normal stomach, normal D1 and D2.   COLONOSCOPY FINDINGS:  1. A polyp in the rectum at 10 cm, friable, easily bled, resected with the     snare.  Otherwise, normal rectum.  2. Sigmoid diverticula.  Remainder of the colonic mucosa appeared normal.   The patient could have had a GI bleed related to the  rectal polyp or even  diverticula.  Bleeding from elsewhere in the GI tracts, i.e., stomach or  small bowel in the setting of Plavix and aspirin is not excluded either.   RECOMMENDATIONS:  1. Continue to hold aspirin and Plavix for at least the next 10 days.  2. Diverticulosis literature provided to [name].  3. Follow-up on path.  4. Further recommendations to follow.      ___________________________________________                                            Maxwell Copeland, M.D.   RMR/MEDQ  D:  06/29/2003  T:  06/29/2003  Job:  [id number]   cc:   Maxwell Copeland, M.D.  [address] [place]  [place] [address]  Fax: [phone]   Maxwell Copeland, M.D.

## 2011-01-24 NOTE — Cardiovascular Report (Signed)
NAME:  Maxwell Copeland, Maxwell Copeland                      ACCOUNT NO.:  [id number]   MEDICAL RECORD NO.:  [id number]                   PATIENT TYPE:  INP   LOCATION:  2926                                 FACILITY:  [facility]   PHYSICIAN:  Salvadore Farber, M.D.             DATE OF BIRTH:  May 09, 1932   DATE OF PROCEDURE:  06/12/2003  DATE OF DISCHARGE:                              CARDIAC CATHETERIZATION   PROCEDURE:  Left heart catheterization, left ventriculography, coronary  angiography, drug-eluding stent placement to the proximal circumflex.   INDICATIONS:  Mr. Klimaszewski is a 77 year old gentleman with previously known  coronary disease who presented on June 09, 2003, with chest discomfort and  transient inferior ST elevations.  Upon arrival he was pain-free, with near-  complete resolution of the ST elevations.  As his INR was 3, catheterization  was deferred until it had decayed substantially.  He has remained pain-free  throughout the remainder of his hospitalization.  He did rule in for non-ST-  segment elevation myocardial infarction with CK-MB rising to approximately  40.  He is brought to the cardiac catheterization lab today.   PROCEDURAL TECHNIQUE:  Informed consent was obtained.  Under 1% lidocaine  local anesthesia, a 6-French sheath was placed in the right femoral artery  using the modified Seldinger technique.  Diagnostic angiography and  ventriculography were performed JL4, JR4, and pigtail catheters.  The  pigtail catheter was then pulled back to the abdominal aorta, and suprarenal  abdominal aortography was performed by power injection.  The case then  turned to intervention.   Diagnostic angiography had demonstrated severe focal in-stent restenosis  within the Palmar-Schatz stent and the proximal circumflex.  This appeared  to be the culprit stenosis.  The patient had arrived in the laboratory on  heparin and eptifibatide.  There were 300 mg of oral Plavix administered.  Additional heparin was given to achieve and maintain an ACT of greater than  200 seconds.  A CLS 3.5 guide was advanced over a wire and engaged in the  ostium of the left main.  A Luge wire was advanced beyond the lesion into  the distal portion of the circumflex without difficulty.  The lesion was  then predilated using a 3 x 18 mm POWERSAIL.  The lesion yielded readily.  The lesion was then stented using a 3 x 23 mm CYPHER stent deploying it at  14 atm.  The proximal portion of the stent was then postdilated using a 3 x  18 mm POWERSAIL at 14 atm.  The distal portion of the stent was postdilated  using a 3.5 x 13 mm POWERSAIL, also at 14 atm.  The patient tolerated the  procedure well and was transferred to the holding room in stable condition.  Final angiograms demonstrated no residual stenosis, TIMI-3 flow to the  distal vasculature, and no dissection.   COMPLICATIONS:  None.   FINDINGS:  1. LV:  153/10/21.  EF 60% with posterobasal hypokinesis.  2. Left main:  Angiographically normal.  3. LAD:  Moderate-sized vessel giving rise to a single diagonal branch.  The     proximal and mid LAD has a long 50% stenosis which is heavily calcified.     It involves the takeoff of the diagonal branch.  4. Circumflex:  Large, dominant vessel.  The proximal circumflex has a     previously placed Palmar-Schatz stent with a diffuse mild in-stent     restenosis with focal 80% in-stent restenosis at what may be the     articulation point.  As detailed above, this was treated with placement     of the drug-eluding stent with excellent angiographic result.  In the mid     circumflex there is a previously placed NIR stent which is widely patent.     The PDA is a small vessel and has an ostial 90% stenosis.  This was not     hazy and, thus, did not appear to be the culprit vessel.  5. RCA:  Small, nondominant vessel which is occluded in its midsection.  6. Abdominal aortography:  Diffuse mildly diseased  abdominal aorta.  There     are single renal arteries bilaterally with mild disease on the left and     no significant disease on the right.  There is severe right common iliac     stenosis.   IMPRESSION/RECOMMENDATIONS:  Successful placement of drug-eluding stent for  focal in-stent restenosis within the proximal circumflex.  Will plan on  Plavix for three months.  Aspirin should be continued indefinitely.  Coumadin will be discontinued while he is on Plavix.  Eptifibatide will be  continued for 18 hours.                                               Salvadore Farber, M.D.    WED/MEDQ  D:  06/12/2003  T:  06/12/2003  Job:  [id number]   cc:   Donia Guiles, M.D.  [address] [place]  [place] [address]  Fax: [phone]   Pomeroy Bing, M.D.

## 2011-01-24 NOTE — H&P (Signed)
[place]  Patient:    Maxwell Copeland, Maxwell Copeland                   MRN: [id number] Adm. Date:  [id number] Attending:  Cathren Laine                         History and Physical  CHIEF COMPLAINT:  Left-sided weakness.  HISTORY OF PRESENT ILLNESS:  The patient is a 77 year old man who refers early his morning when he woke up noted weakness on the left over the left lower extremity associated with slurred speech.  The patient was recently hospitalized from November 07, 1999, from November 09, 1999, after developing an episode that lasted 30 minutes  when was unable to move, was dizzy and diaphoretic.  He underwent workup and testing during this hospitalization including MRI/MRA of the brain which showed  small vessel disease, left internal carotid artery stenosis 40-60% and an ectatic basilar artery.  His echocardiogram showed an ejection fraction of 45-55% with septal and inferior wall hypokinesis.  The patient was discharged home in stable condition on antiplatelet therapy.  During the hospitalization he was evaluated neurologically by Dr. Lesia Sago.  PAST MEDICAL HISTORY: 1. Coronary artery disease 2. Hypercholesterolemia. 3. Hypertension. 4. Degenerative joint disease 5. Alcohol dependence.  MEDICATIONS: 1. Toprol XL 100 q.d. 2. Lescol 40 once a day. 3. Chlorthalidone 12.5 once a day. 4. Aspirin 325 twice a day. 5. P.r.n. nitroglycerin.  ALLERGIES:  No known drug allergies.  SOCIAL HISTORY:  Denies smoking.  Has prior history of alcohol intake.  PRIMARY CARE PHYSICIAN:  Dr.  Arvilla Market, M.D.  CARDIOLOGIST:  Gerrit Friends. Dietrich Pates, M.D. [facility]  REVIEW OF SYSTEMS:  Significant for dysphagia for the last several days at least. He also referred he was scheduled to have a stress test which was suggested to e done while an inpatient although he refused to have at that time last week and as scheduled for the following week as an  outpatient.  PHYSICAL EXAMINATION:  VITAL SIGNS:  Blood pressure 130/65, pulse 85, respirations 16.  GENERAL:  The patient lying on a stretcher in no distress.  HEENT:  Normocephalic, atraumatic.  NECK:  Supple.  No bruits.  LUNGS:  Clear bilaterally.  HEART:  Heart sounds regular rate and rhythm, no murmurs.  ABDOMEN:  Soft.  Bowel sounds present.  No visceromegaly.  EXTREMITIES:  No cyanosis or edema.  NEUROLOGIC:  He is awake and alert.  He is oriented.  His memory and language are intact.  Speech is dysarthric.  His pupils equal and reactive bilaterally. Extraocular cephalic movement present.  Face is asymmetric with a left facial central paresis.  MOTOR EXAMINATION:  Left upper extremity:  Strength 3-4/5, left lower extremity  4/5, DTRs +1 throughout.  Plantars downgoing.  NEUROIMAGING:  I have personally reviewed the CT Scan of the patients brain which showed no acute ischemic changes only periventricular white matter changes, old in nature, no hemorrhage.  IMPRESSION: 1. Right middle cerebral artery distribution infarction. 2. Hypertension. 3. Coronary artery disease. 4. Hypercholesterolemia. 5. Hypertension.  PLAN AND RECOMMENDATIONS:  The diagnosis, condition and further intervention were discussed at length with the patients wife at the bedside.  The patient will be  admitted to the neurosciene unit for monitoring of his neuro status.  Management will consist of hemodynamic support, IV fluids, oxygen 2 liters, and a heparin ischemic stroke protocol.  I have personally reviewed previous  testing including MRI/MRA of the brain which showed no hemodynamically significant stenosis.  Vertebrobasilar system is patent.  Mild stenosis with left internal carotid artery origin by MRA.  His echocardiogram showed hypokinesis, inferior all and septal hypokinesis with ejection fraction mildly reduced 45-55%.  AV is thickened, calcified with ______ restorative  motion.  Peak and mean gradients through the valve are 25 and 15 mmHg.  Calculated AVA of 1.1 square centimeters.  I suspect that the recent event during his last hospitalization when he could not move his arm and he was quite awake could represent an embolic event to the vertebrobasilar system and in view of a new event on another different vascular  territory will suspect has had embolism to 2 different vascular beds.  Therefore will heparinize the patient and start him on anticoagulant Coumadin for secondary long term stroke prevention.  Would also obtain speech therapy for evaluation of swallowing, PT and OT as well. DD:  11/15/99 TD:  11/15/99 Job: [id number]

## 2011-01-24 NOTE — Consult Note (Signed)
NAME:  Maxwell Copeland, BALLO NO.:  [id number]   MEDICAL RECORD NO.:  [id number]                   PATIENT TYPE:  INP   LOCATION:  2926                                 FACILITY:  [facility]   PHYSICIAN:  Charlynne Pander, D.D.S.          DATE OF BIRTH:  [birth year]   DATE OF CONSULTATION:  06/13/2003  DATE OF DISCHARGE:                                   CONSULTATION   REASON FOR CONSULTATION:  Maxwell Copeland is a 77 year old white male  referred by Dr. [place] Bing for a dental consultation.   HISTORY OF PRESENT ILLNESS:  The patient was admitted with unstable angina  and probable acute myocardial infarction.  The patient was hospitalized and  placed on antiplatelet therapy.  The patient subsequently developed bleeding  gums.  Dental consultation was requested to evaluate the bleeding gums and  to rule out dental infection which may affect the patient's systemic health.   PAST MEDICAL HISTORY:  1. Coronary artery disease, status post cardiac catheterization on June 12, 2003 by Dr. Salvadore Farber which revealed significant coronary     artery disease.  The patient had a drug-eluding stent placed in the     proximal circumflex.  Ejection fraction was noted to be approximately 60%     at this time.     a. Patient with previous cardiac catheterization in 2000 which revealed        significant coronary artery disease as well.  2. History of unstable angina and reason for this admission.  3. History of mild aortic stenosis.  4. Hypothyroidism.  5. Hypertension.  6. History of right subcortical stroke with left internal carotid artery     disease of 40-60% as well as small vessel disease per MRI.  7. Dyslipidemia.   ALLERGIES:  None known.   MEDICATIONS:  1. Aspirin 81 mg q.d.  2. Toprol XL 50 mg q.d.  3. Lipitor 40 mg every evening.  4. Levothyroxine 100 mcg q.d.  5. Plavix 75 mg q.d.  6. Altace 10 mg q.d.  7. Protonix 40 mg q.d.  8.  Integrilin 75 mg IV q.8h. per orders.   SOCIAL HISTORY:  The patient is a former tobacco user.  The patient with  occasional use of alcohol.  The patient lives with his wife.  The patient  works as a Electrical engineer.  The patient has several grown children.   FAMILY HISTORY:  Positive for coronary artery disease.   FUNCTIONAL ASSESSMENT:  The patient was independent for ADLs prior to this  admission.   REVIEW OF SYMPTOMS:  As reviewed from the chart and health and history  assessment form this admission.   DENTAL HISTORY:   CHIEF COMPLAINT:  Dental consultation requested for evaluation of bleeding  gums as well as to rule out dental infection.   HISTORY OF PRESENT ILLNESS:  The patient was admitted with acute cardiac  problems.  The patient subsequently was placed on anticoagulant therapies.  The patient developed bleeding gums at that time.  A dental consultation was  then requested for evaluation of the patient's poor dentition, evaluation of  bleeding gums, and to rule out dental infection which may affect the  patient's systemic health.   The patient currently denies acute toothaches, swelling, or abscesses.  The  patient denies acute bleeding in his gums.  The patient does give a history  of bleeding gums for over the weekend and into Monday.  The patient has not  had bleeding since late Monday.  The patient indicates that this is due to  the recent discontinuation of multiple anticoagulant therapies.   The patient last saw a dentist more than 10 years ago and indicates that it  has been a good while.  The patient indicates that most of his dental work  was done in the 7s after service in the [employer].  The patient denies  any presence of dentures or partials at this time.  The patient cannot  remember the last time he had his teeth cleaned.   PHYSICAL EXAMINATION:  GENERAL:  The patient is a well-developed, well-  nourished male sitting in a hospital bed.  NECK:   There is no palpable lymphadenopathy. The patient denies acute TMJ  symptoms.  INTRAORAL EXAM:  There is no evidence of bleeding gums.  The patient has  normal saliva.  There is no evidence of other soft tissue pathology.  DENTITION:  The patient with multiple missing teeth and multiple retained  tooth segments.  PERIODONTAL:  The patient with chronic periodontitis with plaque and  calculus accumulations, gingival recession, and moderate to severe  horizontal vertical bone loss.  DENTAL CARIES:  There are rampant dental caries effecting the remaining  teeth and root segments.  ENDODONTIC:  The patient currently denies acute pulpitis symptoms.  The  patient does give a history of having previous toothaches but none for a  while.  There are multiple periapical radiolucencies affecting the remaining  teeth and retained root segments.  CROWN/BRIDGE:  There are no grown or bridge restorations.  PROSTHODONTIC:  The patient denies the presence of dentures at this time.  OCCLUSION:  The patient with a poor Occlusal scheme secondary to multiple  missing teeth, presence of multiple retained root tips, and lack of  replacement of the missing teeth with clinical acceptable dental  restorations.   LABORATORY DATA:  Radiographic interpretation:  A panoramic x-ray was taken  on June 13, 2003.  There are multiple missing teeth.  There are multiple  dental caries.  There is moderate to severe horizontal/vertical bone loss.  There are multiple retained root segments.  There are multiple periapical  radiolucencies.  There is supereruption and drifting of the unopposed teeth  into the edentulous areas.   ASSESSMENT:  1. History of bleeding gums most likely due to the anticoagulant and     antiplatelet therapies.  There is none now.  2. Chronic periodontitis with bone loss.  3. Gingival recession.  4. Incipient tooth mobility.  5. Moderate to severe horizontal/vertical bone loss. 6. Multiple missing  teeth.  7. Multiple retained root segments and root tips.  8. Multiple dental caries effecting the remaining teeth and root segments.  9. Multiple periapical radiolucencies associated with retained teeth.  10.      No acute pulpitis symptoms at this time.  11.      Poor Occlusal scheme  12.  Current antiplatelet therapy and risk for bleeding with invasive     dental procedures.  13.      Recent drug-coated stent placement as a need for antibiotic     premedication to be antibiotic prophylaxis regimen.  14.      Need to determine the medical ability/stability of the patient to     undergo invasive dental procedures at this time.   PLAN:  1. I discussed the risks, benefits, and complications of various treatment     options with the patient in relationship to his medical and dental     conditions, risks for bleeding, risks for subacute bacterial     endocarditis, and complications to include stroke and death.  We     discussed various treatment options to include no treatment, total and     subtotal extractions with alveoloplasty, periodontal therapy, dental     restorations, crown and bridge therapy, root canal therapy, implant     therapy, and the replacement of missing teeth as indicated after adequate     healing.  The patient is interested in having all remaining teeth     extracted in the future along with subsequent fabrication of upper and     lower dentures as indicated.  The patient is aware that he will need to     be cleared from his medical team before this can occur.  The patient is     also is aware of potential risks associated with discontinuing Plavix     therapy at this time.  The patient is willing to wait for dental care in     the future if indicated.  2. Discussed the findings with Dr. Alsey Bing concerning anticipated     treatment needs.  We will determine the medical ability/stability of the     patient to undergo invasive dental procedures at this time  versus dental     procedures in then future.  We will discuss the ability to discontinue     Plavix therapy prior to multiple extractions with alveoloplasty in the     operating room with general anesthesia.  We will also discuss the need     for antibiotic premedication due to the recent drug-coated stent     placement.  3. We will assist in referring the patient to a general dentist as     indicated.  4. We will assist in acute dental problems as they arise for the remainder     of this admission.                                               Charlynne Pander, D.D.S.    RFK/MEDQ  D:  06/13/2003  T:  06/13/2003  Job:  [id number]   cc:   [place] Bing, M.D.

## 2011-01-24 NOTE — Discharge Summary (Signed)
[place]  Patient:    Maxwell Copeland, Maxwell Copeland                   MRN: [id number] Adm. Date:  [id number] Disc. Date: [id number] Attending:  Glean Hess D CC:         Gerrit Friends. Dietrich Pates, M.D. [facility]             Desma Maxim, M.D.                           Discharge Summary  DISCHARGE DIAGNOSES: 1. Right subcortical infarction. 2. Hypercholesterolemia. 3. Hypertension. 4. Coronary artery disease.  PROCEDURES AND INTERVENTIONS:  CT scan of the head.  SUMMARY OF HOSPITALIZATION:  The patient is a 77 year old man who was referred rom [place] medical office to [facility] Emergency Room with onset of slurred speech, left-sided weakness early the morning of admission.  This patient had been admitted about a week ago to [facility] after a 30-minute episode when he was unable to move.  He was dizzy and diaphoretic.  At that time, workup and testing for cerebrovascular disease showed ectasia of the basilar artery, left internal carotid artery stenosis estimated 40-60% by MRA; and an echocardiogram with an ejection  fraction of 45-55% with septal hypokinesis and inferior wall hypokinesis.  He has been on antiplatelet therapy for stroke prevention.  Upon admission, his neurological examination showed an awake and alert patient with dysarthric speech and a left facial central paresis with a mild weakness of the left upper extremity, strength 3-4/5, and left lower extremity 4/5.  After reviewing all his prior testing and workup including MRI and echocardiogram, he was admitted to the neuroscience unit for further assessment and monitoring his neurological status. He was started on a heparin stroke protocol and Coumadin as well for long-term anticoagulation for secondary stroke prevention.  His neurological status improved significantly and steadily during the hospital stay.  His speech improved.  His  facial weakness improved and his left upper  extremity improved as well.  He is almost back to his normal baseline.  The patient was discharged home in stable condition and he is to be followed up by Dr. Donnamarie Rossetti in one to two months or cardiology follow-up and his primary care physician, Dr. Arvilla Market, in one month. Arrangements have been made to continue to have adjustment of his Coumadin, depending on INRs.  DISCHARGE MEDICATIONS: 1. Toprol XL 100 q.d. 2. Lescol 400 mg q.d. 3. ______ 12.5 mg once a day. 4. Aspirin 325 once a day. 5. Coumadin adjusted dose to INR goal 2-3. DD:  11/18/99 TD:  11/18/99 Job: [id number]

## 2011-01-24 NOTE — Letter (Signed)
May 12, 2006     Donia Guiles, M.D.  [address]  [place], [place] [address]   RE:  CASWELL, ALVILLAR  MRN:  [id number]  /  DOB:  [birth year]   Dear Izora Ribas,   Mr. Bradly returns to the office for continued assessment treatment of  widespread vasculopathy and multiple vascular risk factors.  Since I last  saw him one year ago, he has done generally well.  He was seen in the  emergency department on only one occasion for a laceration to his left  thumb.  This has healed without sequelae.  He has not been hospitalized.  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  This resolved with a few minutes of rest.  He has  claudication, but rarely exerts himself to the extent that this becomes an  issue.  He has had no recurrent cerebrovascular symptoms.   CURRENT MEDICATIONS:  1. Aspirin 325 mg daily.  2. Vitamin E.  3. Metoprolol 50 mg daily.  4. Lisinopril 10 mg daily.  5. Levothyroxine 0.112 mg daily.  6. Niacin 500 mg daily.  7. Torvastatin 40 mg.  8. Omeprazole one daily.   PHYSICAL EXAMINATION:  GENERAL:  Pleasant, trim gentleman in no acute  distress.  VITAL SIGNS:  Weigh5 171, stable.  Blood pressure 150/85, heart rate 60 and  regular, respirations 16.  NECK:  Transmitted murmur bilaterally.  Decreased carotid upstrokes.  LUNGS:  Clear.  CARDIAC:  Normal first heart sound; absent aortic component of the second  heart sound; grade 3/6 mid to late peaking systolic ejection murmur at the  cardiac base.  PMI not appreciated.  ABDOMEN:  Aortic pulsation barely palpable.  Bruit versus transmitted murmur  present.  No organomegaly, no masses.  EXTREMITIES:  1+ distal pulses.  No edema.   LABORATORY DATA:  EKG:  Normal sinus rhythm.  Nondiagnostic inferior Q  waves; otherwise within normal limits.   IMPRESSION:  1. Mr. Teeple is doing well from a symptomatic standpoint.  He has  at      least moderate aortic stenosis by exam and possible symptoms during his      recent episode of exertion.  An echocardiogram will be obtained to      further evaluate his valvular heart disease.  2. He had a 40-60% left internal carotid artery stenosis when last      assessed nearly six years ago.  Repeat carotid ultrasound studies are      in order.  3. He has significant peripheral vascular disease with claudication, but      is not limited by his symptoms.  No further evaluation is necessary at      the present time.  4. Hypertension is adequately controlled.  A slightly lower blood pressure      would be desirable.  He has been treated with low-dose diuretic in the      past.  This will be resumed.  Abdominal ultrasonography is warranted to      exclude abdominal aortic aneurysm in this gentleman with widespread      vascular disease over the age of 39 and with a history of tobacco use.      I will review the results of that study and reassess Mr. Hershey in      four months.    Sincerely,      Gerrit Friends. Dietrich Pates, MD, [facility]  RMR/MedQ  DD:  05/12/2006  DT:  05/12/2006  Job #:  [id number]

## 2011-01-24 NOTE — H&P (Signed)
NAME:  CRUISE, BAUMGARDNER                      ACCOUNT NO.:  [id number]   MEDICAL RECORD NO.:  [id number]                   PATIENT TYPE:  INP   LOCATION:  1827                                 FACILITY:  [facility]   PHYSICIAN:  Learta Codding, M.D.                 DATE OF BIRTH:  Jul 08, 1932   DATE OF ADMISSION:  06/09/2003  DATE OF DISCHARGE:                                HISTORY & PHYSICAL   REFERRING PHYSICIAN:  Donia Guiles, M.D.   CARDIOLOGIST:  [place] Bing, M.D.   CURRENT COMPLAINTS:  Sudden onset of substernal associated with significant  EKG changes.   HISTORY OF PRESENT ILLNESS:  [name] is a 77 year old white male with a  prior history of coronary artery disease.  The patient is status post a  stent placement to the circumflex coronary artery in 2000.  The patient  earlier today had felt weak and fatigued.  Later this evening after eating a  hamburger the patient had gone to [employer] where he suddenly started  developing significant diaphoresis and substernal chest pain. The pain was  radiating to his neck and throat.  The patient was quite diaphoretic and  cold and clammy. First responder sent were unable to obtain a blood  pressure, his heart rate was 62 and he appeared to be in a junctional  rhythm.  There was significant ST segment elevation noted on the monitor.  The patient was given aspirin and blood pressure was ultimately obtained of  78/48.  He was given Morphine sulfate for pain relief as well as Atropine.  Subsequently his blood pressure rose to 122/74 and his heart rate was now up  to 70 beats per minute in normal sinus rhythm with still significant ST  segment elevation in the inferior leads and reciprocal changes in the  anterior precordial leads.  By the time the patient arrived to the emergency  room he eventually became pain free.  His ST segment changes although not  completely resolved were markedly improved in the inferior leads.  The  patient did have already Q waves in the inferior leads but on reviewing old  ECG, were present on a tracing of 2000.  At the time of my interview the  patient was pain free.  He complains of no shortness of breath, diaphoresis,  or sweatiness.  His second electrocardiogram showed mild ST segment  elevation in leads 2 and 3 but with resolution of reciprocal changes and no  ST segment depression in other precordial leads.  His blood pressure also  was 114/67.   ALLERGIES:  No known drug allergies.   MEDICATIONS ON ADMISSION:  1. Simvastatin 80 mg p.o. daily.  2. Metoprolol 100 mg p.o. daily.  3. Synthroid 0.1 mg daily.  4. Thalidone 25 mg p.o. daily.  5. Coumadin 4 mg every other day alternating with 2 mg daily.   SOCIAL HISTORY:  The patient is a former  tobacco user.  He like to hunt.  He  occasionally drinks alcohol.  He lives with his wife.   FAMILY HISTORY:  Notable for coronary artery disease.   PAST MEDICAL HISTORY:  1. History of coronary artery disease,  last coronary artery disease in     2000.  The patient was found to have a mid 50% LAD lesion as well as     scattered lesions in the circumflex coronary artery, he had a tight mid     circumflex stenosis, 95%, for which he underwent stenting at that time.     He also was found to have a totally occluded right coronary artery which     filled through collaterals from the LAD, his ejection fraction was 50%.     An echocardiogram from 2001 demonstrated an ejection fraction from 45 to     55% with inferior hypokinesis.  2. History of mild aortic stenosis.  3. History of hypertension.  4. History of dyslipidemia.  5. History of right subcortical infarct with left internal carotid artery     disease of 40 to 60% and small vessel disease by MRI.   REVIEW OF SYSTEMS:  As per HPI.  Nausea earlier, but currently no nausea or  vomiting.  No shortness of breath, orthopnea or paroxysmal nocturnal  dyspnea.  No claudication.  No  palpitations.  No recent syncope.  The  patient denies abdominal pain.   PHYSICAL EXAMINATION:  VITAL SIGNS: Blood pressure 114/67, heart rate 79  beats per minute.  GENERAL: Well-nourished white male who is somewhat pale appearing but in no  current distress.  HEENT: Pupils are equal. Throat clear.  NECK: Supple, normal carotid upstrokes, no carotid bruits.  LUNGS: Somewhat diminished but otherwise clear.  HEART: Regular rate and rhythm, normal S1, S2, no pathological murmurs.  I  do not hear a systolic murmur consistent with AS.  No S3 or S4.  ABDOMEN: Soft, nontender, no rebound or guarding, good bowel sounds.  EXTREMITIES: Peripheral pulses 2+, there is no cyanosis, clubbing or edema.   Echocardiogram as described above.  Laboratory work is currently pending.   IMPRESSION AND PLAN:  1. Unstable angina with marked ST segment elevation in inferior leads, now     with near complete resolution.  The patient has pre-infarction angina.     His electrocardiogram is significantly improved.  Based on the findings     or his prior catheterization the patient did have prior Q waves and     hypokinesis in the inferior wall, he had an occluded right coronary     artery.  I suspect he has insufficient collaterals which were originating     from the left anterior descending.  I suspect that the patient now has     multivessel coronary artery disease.  If he has ongoing chest pain he     will require cardiac catheterization tonight.  If he remains stable,     proceed with urgent cardiac catheterization in the morning and further     management will be dictated by the results of that study.  Again I     suspect the patient may require surgery.  He will be started on aspirin,     IV nitroglycerin and Integrilin.  The patient is on Coumadin and we have     held this.  We are waiting for his PT, INR.  Will start heparin once his     INR is less than two. 2. Hypertension.  This is controlled.  3.  History of aortic stenosis.  No evidence of significant aortic stenosis     by physical examination.  4. History of alcohol use.  5. History of dyslipidemia.    DISPOSITION:  Emergent catheterization if the patient has recurrent  symptoms.  Otherwise the patient will be scheduled for first case tomorrow.                                                  Learta Codding, M.D.    GED/MEDQ  D:  06/09/2003  T:  06/09/2003  Job:  [id number]   cc:   Donia Guiles, M.D.  [address] [place]  [place] [address]  Fax: [phone]   [place] Bing, M.D.

## 2011-01-24 NOTE — Op Note (Signed)
NAME:  Maxwell Copeland, Maxwell Copeland                      ACCOUNT NO.:  [id number]   MEDICAL RECORD NO.:  [id number]                   PATIENT TYPE:  AMB   LOCATION:  DAY                                  FACILITY:  [facility]   PHYSICIAN:  Charlynne Pander, D.D.S.          DATE OF BIRTH:  18-Jan-1932   DATE OF PROCEDURE:  10/12/2003  DATE OF DISCHARGE:                                 OPERATIVE REPORT   PREOPERATIVE DIAGNOSES:  1. Coronary artery disease.  2. Status post angioplasty with drug-eluting stent.  3. Chronic apical periodontitis.  4. Multiple retained root segments.  5. Chronic periodontitis.  6. Bilateral mandibular tori.   POSTOPERATIVE DIAGNOSES:  1. Coronary artery disease.  2. Status post angioplasty with drug-eluting stent.  3. Chronic apical periodontitis.  4. Multiple retained root segments.  5. Chronic periodontitis.  6. Bilateral mandibular tori.   OPERATION/PROCEDURE:  1. Dental examination.  2. Extraction of remaining teeth (teeth #3, 4, 5, 6, 7, 8, 9, 10, 11, 12,     13, 18, 20, 21, 22, 23, 24, 25, 26, 27, 29, 30, and 31).  3. Four quadrants of alveoloplasty.  4. Bilateral mandibular tori reductions.   SURGEON:  Charlynne Pander, D.D.S.   ASSISTANT:  Elliot Dally (Sales executive).   ANESTHESIA:  General anesthesia via nasal endotracheal tube.   MEDICATIONS:  1. Ampicillin 2 g IV prior to invasive dental procedures.  2. Local anesthesia with total utilization of four carpules each containing     54 mg of mepivacaine without epinephrine and three carpules each     containing 9 mg of Marcaine with 0.009 mg of epinephrine.   SPECIMENS:  There were 23 teeth which were discarded.   CULTURES:  None.   DRAINS:  None.   ESTIMATED BLOOD LOSS:  150 mL.   FLUIDS:  1800 mL of lactated Ringer's solution.   COMPLICATIONS:  None.   INDICATIONS:  The patient had a history of coronary artery disease.  The  patient underwent angioplasty with a drug-eluting stent  placement.  A dental  consultation was then requested to rule out dental infection which would  affect the patient's systemic health and possibly cause an infection to the  stent.  The patient was then evaluated and treatment planned for extraction  of his remaining teeth with alveoloplasty and other preprosthetic surgery as  needed.  This treatment plan was formulated to decrease the risk of  complications associated with dental infection from affecting the patient's  systemic health as well as to assist in prosthodontics rehabilitation.   OPERATIVE FINDINGS:  When the patient was examined in the operating room #6,  the teeth were identified for extraction.  The patient was noted to be  affected by chronic periodontitis, chronic apical periodontitis, multiple  retained root segments, and the presence of bilateral mandibular lingual  tori.  The aforementioned necessitated the removal of all remaining teeth as  well as the tori to reduce  the risks of dental disease from affecting the  patient's systemic health and to assist in prosthodontics rehabilitation.   DESCRIPTION OF PROCEDURE:  The patient was brought to the main operating  room #6.  The patient was then placed in the supine position on the  operating room table.  The patient was then intubated utilizing a nasal  endotracheal tube.  The patient was then prepped and draped in the usual  manner for dental medicine procedure.  The oral cavity was thoroughly  examined with the findings as noted above.  The throat pack was placed at  this time.  The patient was then ready for the oral surgical procedure as  follows:   Local anesthesia was administered sequentially over the two-hour long  procedure with a total utilization of four carpules each containing 54 mg of  mepivacaine without epinephrine as well as three carpules each containing 9  mg of Marcaine with 0.009 mg of epinephrine.   The maxillary quadrants were first approached.   Anesthesia was delivered as  previously described.  A 15-blade incision was made from the distal of #1  through the mesial of #15.  A surgical flap was then reflected.  The  remaining teeth were then subluxated with a series of straight elevators.  Teeth and root segments #3, 4, 5, 6, 7, 8, 9, 10, 11, 12, and 13 were then  removed with a 150 forceps without complications.  Alveoloplasty was then  performed utilizing rongeurs and bone file.  The surgical site was then  irrigated with copious amounts of sterile saline x2.  The tissues were  approximated and trimmed appropriately.  The surgical site was then closed  from the distal of #1 through the mesial of #8 utilizing 3-0 chromic gut  suture material in a continuous interrupted suture technique x1.  The  maxillary left quadrant was then approached and closed utilizing 3-0 chromic  gut suture material in a continuous interrupted suture technique from the  distal of #15 through the mesial of #9.   The mandibular quadrants were then approached.  Anesthesia was delivered as  previously described.  A 15-blade incision was made from the distal of #18  through the mesial of #27.  A surgical flap was then reflected.  The  mandibular left teeth were then subluxated with a series of straight  elevators.  Teeth and root segments #18, 20, 21, 23, 24, 25, 26 were then  removed with the 151 forceps without complications.  A surgical hand-piece  and bur were then utilized to remove buccal bone around tooth #22.  The  tooth was then removed with the 151 forceps without complications.  The  mandibular left torus was then identified and removed utilizing a surgical  hand-piece and bur and copious amounts of sterile saline.  Alveoloplasty was  then performed utilizing rongeurs and bone file.  The surgical site was then irrigated with copious amounts of sterile saline x2.  The surgical site was  then closed from the distal of #18 through the mesial of #24  utilizing 3-0  chromic gut suture material in a continuous interrupted suture technique x1.  On additional interrupted suture was placed in the area of #19.   The mandibular right quadrant was then approached.  A 15-blade incision was  made from the distal of #32 through the mesial of #27.  The surgical flap  was then reflected utilizing an elevator.  The remaining teeth were then  subluxated with a series of straight  elevators.  Teeth #31, 30, 29, 28, 27  were then removed with the 151 forceps without complications.  Alveoloplasty  was then performed utilizing rongeurs and bone file.  The surgical site was  then irrigated with copious amounts of sterile saline x2.  The mandibular  right torus was then identified and removed with a surgical hand-piece and  bur and copious amounts of sterile saline.  The surgical site was then again  irrigated with copious amounts of sterile saline x2.  The tissues were  approximated and trimmed appropriately.  The surgical site was then closed  utilizing 3-0 chromic gut suture material in a continuous interrupted suture  technique from the distal of #32 through the mesial of #25 utilizing again 3-  0 chromic gut suture material.   The entire mouth was irrigated at this point in time.  The patient was  examined for complications.  Seeing none, the dental medicine procedure was  deemed to be complete.  A series of 4 x 4 gauzes were placed in the mouth to  aid hemostasis along with a oral airway.  At this point in time the patient  was handed over to the anesthesia team for final disposition.  After  appropriate amount of time, the patient was extubated and taken to the post  anesthesia care unit with stable vital signs and good oxygenation level.  All counts were correct for the dental medicine procedure.   The patient will be given appropriate pain medication and will follow up  with dental medicine in approximately one week for evaluation for suture   removal.  The patient is to contact dental medicine if  acute problems  arise.                                               Charlynne Pander, D.D.S.    RFK/MEDQ  D:  10/12/2003  T:  10/12/2003  Job:  [id number]   cc:   Houghton Bing, M.D.

## 2011-01-24 NOTE — Discharge Summary (Signed)
NAME:  Maxwell Copeland, Maxwell Copeland                      ACCOUNT NO.:  [id number]   MEDICAL RECORD NO.:  [id number]                   PATIENT TYPE:  INP   LOCATION:  2926                                 FACILITY:  [facility]   PHYSICIAN:  Learta Codding, M.D.                 DATE OF BIRTH:  [birth year]   DATE OF ADMISSION:  06/09/2003  DATE OF DISCHARGE:  06/14/2003                           DISCHARGE SUMMARY - REFERRING   PROCEDURES:  Cardiac catheterization on June 12, 2003.   REASON FOR ADMISSION:  Please refer to the dictated admission note.   LABORATORY DATA:  Cardiac enzymes:  Peak CPK 407/31 (7.6%); peak troponin I  5.1.  WBC 8.8, HGB 10.8, HCT 32 (MCV 89), platelets 200 on admission -  hemoglobin 10.3/hematocrit 28.7 at discharge.  Iron profile with decreased  iron 29, normal TIBC 282, decreased percent sat 10, normal electrolytes and  renal function, INR of 2.8 at admission, potassium 3.2 on admission,  subsequent levels within normal limits.  Normal renal function.  Decreased  total protein 5.8/albumin 3.1.  Mildly elevated AST 47, otherwise normal  liver enzymes, normal TSH.   Admission chest x-ray:  No acute disease. Orthopanogram:  Innumerable  caries; right mandibular molar periapical lucency (question infection.   HOSPITAL COURSE:  The patient was admitted by Dr. Andee Lineman for treatment of  symptoms suggestive of unstable angina pectoris.  He was noted to have a  severe ST elevation with subsequent clinical reperfusion.  He had no  coronary disease with previous stenting of the circumflex and a known  occlusion of the RCA.   Serial cardiac markers were elevated (see lab data) and the patient was  maintained on a regimen consisting of aspirin, Integrilin, and heparin.  Of  note, he had been on Coumadin prior to admission with a history of a stroke  and cerebrovascular disease.  This was placed on hold.  Per Dr. Marvel Plan  review, recommendation is resume this following completion  of Plavix.   While waiting for subtherapeutic INR levels before proceeding with cardiac  catheterization, the patient developed a significant gingival bleeding.  This remained stable on a combination of heparin and Integrilin.  Following  stabilization of the patient's cardiac status, the patient was referred to  Dr. Kristin Bruins for evaluation of the oral bleeding.  There was no active  bleeding at the time of evaluation.  Dr. Kristin Bruins referred with Dr. Dietrich Pates  and recommendation was to defer the plans for a full mouth extraction  following completion of the treatment with Plavix.   The patient underwent cardiac catheterization on hospital day three, by Dr.  Randa Evens (see report for full details), revealing an 80% in stent  lesion at the proximal CFX stent site.  The stent distal to the lesion  remained widely patent.  Residual anatomy notable for a known 100% proximal  RCA and non-obstructive LAD disease.  Left ventricular function preserved  with  posterolateral hypokinesis.   Dr. Samule Ohm proceeded with the placement of a Cypher stent with reduction of  the lesion to 0% residual stenosis.  He recommended Plavix times three  months.   The remainder of the patient's course remained benign.  There was no  recurrent chest discomfort.  Gingival bleeding had resolved.   Of note, the patient was also found to have severe peripheral vascular  disease during catheterization, with a noted severe right common iliac  stenosis.  No significant renal artery disease was noted and there was  diffuse mild disease of the abdominal aorta.   The patient's anemia remained stable with a discharge level of 10.3.  Iron  profile was consistent with iron deficiency anemia and the patient was  placed on supplemental iron.  The recommendation is to proceed with  outpatient GI evaluation.   MEDICATION ADJUSTMENTS THIS ADMISSION:  The addition of Plavix (three  months), down titration of Toprol to 50  q.d., addition of Altace, and  addition of supplemental iron.   MEDICATIONS AT DISCHARGE:  Plavix 75 mg q.d. (three months), coated aspirin  81 mg q.d., Toprol XL 50 mg q.d., Altace 10 mg q.d., Zocor 80 mg q.d., iron  sulfate 325 mg b.i.d., Synthroid 0.1 mg q.d., Nitrostat 0.4 mg p.r.n.   INSTRUCTIONS:  1. Discontinue taking Coumadin.  2. No strenuous activity, heavy lifting, or driving until seen by physician.  3. Low fat/cholesterol diet.  4. Call the office if there is any swelling/bleeding in the groin.   FOLLOW UP:  1. The patient is scheduled to follow up with Dr. Marvel Plan [facility] on     Monday, July 03, 2003 at 2:00 p.m., at the [facility].  2. The patient is instructed to return to Dr. Donia Guiles in     approximately two weeks.  3. Arrangements will also be made for the patient to proceed with a GI     evaluation for anemia.   DISCHARGE DIAGNOSES:  1. Status post acute myocardial infarction with clinical reperfusion.     a. Elective stent (Cypher) 80% proximal circumflex in stent restenosis,        June 12, 2003.     b. Widely patent distal circumflex stent.     c. Normal left ventricular function.     d. Narrow 100% proximal right coronary artery.  2. Peripheral vascular disease.     a. Severe right common iliac artery stenosis.  3. Status post gingival bleeding.  4. Iron deficiency anemia.  5. History of stroke.     a. Previously treated with Coumadin anticoagulation.  6.     Hypertension.  7. Dyslipidemia.  8. Treated hypothyroidism.      Gene Serpe, P.A. [facility]                      Learta Codding, M.D.    GS/MEDQ  D:  06/14/2003  T:  06/14/2003  Job:  [id number]   cc:   Donia Guiles, M.D.  [address] [place]  [place] [address]  Fax: [phone]   M.D. Kuslinski
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
## 2011-01-24 NOTE — H&P (Signed)
NAME:  Maxwell Copeland, Maxwell Copeland                        ACCOUNT NO.:  [id number]   MEDICAL RECORD NO.:  [id number]                  PATIENT TYPE:   LOCATION:                                       FACILITY:   PHYSICIAN:  R. Roetta Sessions, M.D.              DATE OF BIRTH:  1932/05/22   DATE OF ADMISSION:  DATE OF DISCHARGE:                                HISTORY & PHYSICAL   CHIEF COMPLAINT:  Anemia.   HISTORY OF PRESENT ILLNESS:  Maxwell Copeland is a 77 year old male who  presents to our office for evaluation of iron deficiency anemia.  He is 12  days status post angioplasty with 2 stents placed per the patient's report.  He was discharged from the hospital approximately 6 days ago.  He reports  since then he has been taking iron 325 mg b.i.d., Plavix 75 mg daily, and  aspirin 325 mg daily.  Prior to hospitalization he was on Coumadin daily.  He denies any history of peptic ulcer disease; however, today he reports to  me that he had moderate to large amount of hematochezia which he describes  as bright red blood from his rectum into the toilet.  He reports that his  bowel movements are every other day and he is currently using a stool  softener for good relief with defecation and prior to this he was using a  laxative which he has since discontinued.  He denies any melena. He denies  any abdominal pain, nausea, vomiting, or diarrhea. He denies any history of  GERD, dyspepsia, or dysphagia.  He does have a history of MI x3, CVA,  hypothyroidism and hypercholesterolemia.   PAST MEDICAL HISTORY:  1. CAD with MI and angioplasty with stent placement.  2. Hypothyroidism.  3. CVA.  4. Hypercholesterolemia.   CURRENT MEDICATIONS:  1. Iron 325 mg b.i.d.  2. Altace 10 mg daily. ;  3. Plavix 75 mg daily.  4. Synthroid 1 mcg daily.  5. Aspirin 325 mg daily.  6. Vitamin E 400 international units daily.  7. Metoprolol 100 mg daily.  8. Chlorthalidone 12.5 mg daily.  9. Zocor 80 mg daily.   ALLERGIES:  No known drug allergies.   FAMILY HISTORY:  No known family history of colorectal carcinoma, liver, or  chronic GI problems.  Mother is deceased at age 29 with CAD and MI.  Father  also is deceased at age 49 with MI.  He has 3 brothers all of whom are  healthy.   SOCIAL HISTORY:  Maxwell Copeland has been married for 42 years and has 3  daughters and 1 son all of whom are healthy.  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  He reports a 40+ year history of smoking 1 pack  per day; however, he quit approximately 15 years ago.  He reports occasional  alcohol use; however, he did drink more readily in the past; however,  he  denies any history of alcohol abuse.  He denies any drug use.   REVIEW OF SYSTEMS:  CONSTITUTIONAL:  He reports stable weight.  He denies  any fever or chills.  CARDIAC:  See HPI.  He denies any chest pain currently  or palpitations.  PULMONARY:  He denies any shortness of breath, dyspnea or  cough.  SKIN:  He does report that he has a irrigation and red rash to his  right posterior extremity.  NEUROLOGIC:  He is complaining of occasional  dizziness.   PHYSICAL EXAMINATION:  VITAL SIGNS:  Weight 174 pounds.  Height 68 inches.  Temperature 96.5, blood pressure 140/80, pulse 68.  GENERAL:  Maxwell Copeland is a 77 year old, alert, pleasant, and  cooperative Caucasian male in no acute distress.  HEENT:  Sclerae are clear, nonicteric.  Conjunctivae are pink.  Oropharynx  pink and moist without any lesions.  NECK:  Supple without thyromegaly or mass.  CHEST:  Heart regular rate and rhythm with a 3/6 murmur.  LUNGS:  Clear to auscultation bilaterally.  ABDOMEN:  Positive bowel sounds x4, soft, nontender, nondistended.  No  palpable organomegaly.  RECTAL:  There are 3 small protrusions from the rectal sphincter which  appear to be hemorrhoids; however, there are no signs of thrombosis.  They  are not erythematous or warm.  EXTREMITIES:  2+ pedal  pulses bilaterally, no pedal edema.  There is some  slight swelling and erythema to his left wrist where IV was previously  removed.  No warmth or tenderness.  There is also a patchy pink erythematous  diffuse dryness of skin to his right posterior upper leg.   LABORATORY STUDIES:  Hemoglobin and hematocrit from October 6 were 10.3 and  28.7 respectively with MCV of 88.9.  He had iron of 29, a TIBC of 282 and a  10% saturation.  Sodium 139, potassium 4.4, chloride 109, CO2 27, glucose  104, BUN 14, creatinine 1.2, total bilirubin 0.8, alkaline phosphatase 71,  SGOT 47, SGPT 23, total protein 5.8, albumin 3.1, calcium 8.4.  Repeat STAT  CBC today revealed hemoglobin of 11.2.   ASSESSMENT:  Maxwell Copeland is a 77 year old Caucasian male with iron  deficiency anemia and moderate to large volume hematochezia: Given his  history of recent MI and angioplasty with stent placement, I have discussed  this case with both Dr. Jena Gauss as well as Dr. Dietrich Pates his cardiologist.  Dr.  Dietrich Pates stated that he felt that Maxwell Copeland's cardiac condition was  stable and he had good LV function; and that Dr. Jena Gauss could proceed with  colonoscopy in the near future.  He also stated that it would be appropriate  to hold the aspirin and Plavix if any biopsies needed to be obtained.  I  discussed this procedure with Dr. Jena Gauss as well as with Mr. Alfieri and his  wife. They both agree with this plan.  I also discussed the risks and  benefits which include, but are not limited to bleeding, perforation, and  infection.  Consent will be obtained.  Maxwell Copeland hemoglobin has had  good response with oral iron therapy and discontinuation of Coumadin.  Therefore hopefully bleeding is currently resolving at this point in time.   RECOMMENDATIONS:  1. Will schedule colonoscopy with EGD to evaluate for GI bleeding with Dr.    Jena Gauss at [facility].  Mr. Kon was instructed to hold his     iron for 7  days, aspirin for 3 days,  and Plavix for 3 days prior to     procedure.  We will schedule this procedure for at least a week from     today.  2.     I have called in a prescription for Protonix 40 mg to be taken daily 30     minutes before breakfast.  3. Further recommendations to follow pending Dr. Luvenia Starch evaluation with     colonoscopy and EGD.   We would like to thank Dr. Dietrich Pates for this kind referral.     _____________________________________  ___________________________________________  Nicholas Lose, N.P.               Jonathon Bellows, M.D.   KC/MEDQ  D:  06/20/2003  T:  06/20/2003  Job:  [id number]   cc:   Byron Bing, M.D.

## 2011-01-27 ENCOUNTER — Telehealth: Payer: Self-pay | Admitting: Cardiovascular Disease

## 2011-01-27 NOTE — Telephone Encounter (Signed)
Pt daughter calls. Pt has had increased shortness of breath and would like to see Dr. Eden Emms to discuss procedure at [facility].  Appt. Made for May 24,12 at 2:15pm.  Mylo Red RN

## 2011-01-27 NOTE — Telephone Encounter (Signed)
C/o sob.

## 2011-01-29 ENCOUNTER — Encounter: Payer: Self-pay | Admitting: Cardiovascular Disease

## 2011-01-30 ENCOUNTER — Encounter: Payer: Self-pay | Admitting: Cardiovascular Disease

## 2011-01-30 ENCOUNTER — Ambulatory Visit (INDEPENDENT_AMBULATORY_CARE_PROVIDER_SITE_OTHER): Payer: Medicare Other | Admitting: Cardiovascular Disease

## 2011-01-30 ENCOUNTER — Ambulatory Visit (INDEPENDENT_AMBULATORY_CARE_PROVIDER_SITE_OTHER)
Admission: RE | Admit: 2011-01-30 | Discharge: 2011-01-30 | Disposition: A | Discharge status: Home / Self Care | Payer: Medicare Other | Source: Ambulatory Visit | Attending: Cardiovascular Disease | Admitting: Cardiovascular Disease

## 2011-01-30 VITALS — BP 130/70 | HR 60 | Ht 68.0 in | Wt 150.0 lb

## 2011-01-30 DIAGNOSIS — I2510 Atherosclerotic heart disease of native coronary artery without angina pectoris: Secondary | ICD-10-CM

## 2011-01-30 DIAGNOSIS — R0602 Shortness of breath: Secondary | ICD-10-CM

## 2011-01-30 DIAGNOSIS — J40 Bronchitis, not specified as acute or chronic: Secondary | ICD-10-CM

## 2011-01-30 DIAGNOSIS — I359 Nonrheumatic aortic valve disorder, unspecified: Secondary | ICD-10-CM

## 2011-01-30 DIAGNOSIS — R42 Dizziness and giddiness: Secondary | ICD-10-CM

## 2011-01-30 DIAGNOSIS — E7800 Pure hypercholesterolemia, unspecified: Secondary | ICD-10-CM

## 2011-01-30 NOTE — Assessment & Plan Note (Signed)
Cholesterol is at goal.  Continue current dose of statin and diet Rx.  No myalgias or side effects.  F/U  LFT's in 6 months. Lab Results  Component Value Date   LDLCALC 69 07/18/2005

## 2011-01-30 NOTE — Progress Notes (Signed)
Maxwell Copeland is seen today for F/U of carotid disease, CAD and AS.  He  had reintervention to the circ by Dr Excell Seltzer.in 2010.  Has been battling bronchitis and URI for a month.  Seen at urgent care and completed antibiotics but not taking mucinex or robitussin.  No fever Cough nonproductive.  Echo 11/01/10 with progressive AS mean gradient 39/peak 67 normal EF.  Reviewed angio from 08/22/09 Left dominant circumflex with instent restonosis with good result by Dr Excell Seltzer.  Has had two episodes of dizzyness associate with cough and getting too hot working outside.  Does not sound cardiac related.  Discussed prognosis of AS at his age and fact that he is not ideal candidate for AVR.  No chest pain or marked change in dyspnea.  No history of CHF.  Prefer to reevaluate in 8 weeks.  He chews Maxwell Copeland a lot and I suspect that this has something to do with his dizzyness especially when he has an empty stomach and gets hot.  He understands the relatationship now between the "shot" of nicotine and potential dizzyness  ROS: Denies fever, malais, weight loss, blurry vision, decreased visual acuity, cough, sputum, SOB, hemoptysis, pleuritic pain, palpitaitons, heartburn, abdominal pain, melena, lower extremity edema, claudication, or rash.   General: Affect appropriate Healthy:  appears stated age HEENT: normal Neck supple with no adenopathy JVP normal no bruits no thyromegaly Lungs clear with no wheezing and good diaphragmatic motion Heart:  S1/S2  Muffled with severe AS murmur no rub, gallop or click PMI normal Abdomen: benighn, BS positve, no tenderness, no AAA no bruit.  No HSM or HJR Distal pulses intact with no bruits No edema Neuro non-focal Skin warm and dry No muscular weakness   Current Outpatient Prescriptions  Medication Sig Dispense Refill  . amLODipine (NORVASC) 10 MG tablet Take 10 mg by mouth daily.        Marland Kitchen aspirin 325 MG tablet Take 81 mg by mouth daily.       . clopidogrel (PLAVIX) 75 MG  tablet Take 75 mg by mouth daily.        Marland Kitchen levothyroxine (SYNTHROID, LEVOTHROID) 112 MCG tablet Take 112 mcg by mouth daily.        . metoprolol (LOPRESSOR) 50 MG tablet Take 50 mg by mouth daily.        . niacin (SLO-NIACIN) 500 MG tablet Take 500 mg by mouth at bedtime.        . ranitidine (ZANTAC) 150 MG tablet Take 150 mg by mouth daily.        . rosuvastatin (CRESTOR) 40 MG tablet Take 40 mg by mouth daily.        . vitamin E (VITAMIN E) 400 UNIT capsule Take 400 Units by mouth daily.        . nitroGLYCERIN (NITROSTAT) 0.4 MG SL tablet Place 1 tablet (0.4 mg total) under the tongue every 5 (five) minutes as needed for chest pain.  25 tablet  1  . DISCONTD: omeprazole (PRILOSEC) 20 MG capsule Take 20 mg by mouth daily.        Marland Kitchen DISCONTD: Vitamin E 16109 UNIT/ML OIL Apply topically daily.          Allergies  Review of patient's allergies indicates not on file.  Electrocardiogram:  Assessment and Plan

## 2011-01-30 NOTE — Assessment & Plan Note (Signed)
No angina  Continue current medical Rx 

## 2011-01-30 NOTE — Assessment & Plan Note (Signed)
Dont think these are related to heart.  BP ok and not postural.  Limit smokeless tobacco and see primary about URI.  Avoid dehydration and marked heat.

## 2011-01-30 NOTE — Assessment & Plan Note (Signed)
Exam with lack of S2 and severe AS.  Gradient barely meet criteria.  Will reevaluate 8 weeks.  Will likely need cath in upcoming year and decision on AVR vs TAVR

## 2011-01-30 NOTE — Patient Instructions (Signed)
Schedule an appointment for a chest xray. Tomorrow is OK.  Schedule an appointment to see Dr Eden Emms in 6-8 weeks.

## 2011-01-30 NOTE — Assessment & Plan Note (Signed)
URI with cough.  Encouraged to F/U with primary and will order CXR today.  Take mucinex and robitussin

## 2011-01-31 ENCOUNTER — Telehealth: Payer: Self-pay | Admitting: Cardiovascular Disease

## 2011-01-31 NOTE — Telephone Encounter (Signed)
Pt wants cxr results

## 2011-01-31 NOTE — Telephone Encounter (Signed)
Daughter June called to get patient Chest xray results. June was given a overview results of test . She is aware that Dr. Eden Emms  needs to  review test and make recommendations if needed.

## 2011-02-05 NOTE — Telephone Encounter (Signed)
PT AWARE OF  CXR  RESULTS ./CY 

## 2011-03-17 ENCOUNTER — Ambulatory Visit: Payer: Medicare Other | Admitting: Cardiovascular Disease

## 2011-03-19 ENCOUNTER — Encounter: Payer: Self-pay | Admitting: Cardiovascular Disease

## 2011-04-28 ENCOUNTER — Ambulatory Visit: Payer: Medicare Other | Admitting: Cardiovascular Disease

## 2011-05-08 ENCOUNTER — Emergency Department (HOSPITAL_COMMUNITY): Payer: Medicare Other

## 2011-05-08 ENCOUNTER — Inpatient Hospital Stay (HOSPITAL_COMMUNITY)
Admission: EM | Admit: 2011-05-08 | Discharge: 2011-05-10 | DRG: 280 | Disposition: A | Discharge status: Home / Self Care | Payer: Medicare Other | Attending: Internal Medicine | Admitting: Internal Medicine

## 2011-05-08 DIAGNOSIS — Y92009 Unspecified place in unspecified non-institutional (private) residence as the place of occurrence of the external cause: Secondary | ICD-10-CM

## 2011-05-08 DIAGNOSIS — I6529 Occlusion and stenosis of unspecified carotid artery: Secondary | ICD-10-CM | POA: Diagnosis present

## 2011-05-08 DIAGNOSIS — Z9861 Coronary angioplasty status: Secondary | ICD-10-CM

## 2011-05-08 DIAGNOSIS — Z79899 Other long term (current) drug therapy: Secondary | ICD-10-CM

## 2011-05-08 DIAGNOSIS — I10 Essential (primary) hypertension: Secondary | ICD-10-CM | POA: Diagnosis present

## 2011-05-08 DIAGNOSIS — I359 Nonrheumatic aortic valve disorder, unspecified: Secondary | ICD-10-CM | POA: Diagnosis present

## 2011-05-08 DIAGNOSIS — Z7902 Long term (current) use of antithrombotics/antiplatelets: Secondary | ICD-10-CM

## 2011-05-08 DIAGNOSIS — E039 Hypothyroidism, unspecified: Secondary | ICD-10-CM | POA: Diagnosis present

## 2011-05-08 DIAGNOSIS — Z8673 Personal history of transient ischemic attack (TIA), and cerebral infarction without residual deficits: Secondary | ICD-10-CM

## 2011-05-08 DIAGNOSIS — N289 Disorder of kidney and ureter, unspecified: Secondary | ICD-10-CM | POA: Diagnosis present

## 2011-05-08 DIAGNOSIS — I2510 Atherosclerotic heart disease of native coronary artery without angina pectoris: Secondary | ICD-10-CM | POA: Diagnosis present

## 2011-05-08 DIAGNOSIS — I214 Non-ST elevation (NSTEMI) myocardial infarction: Principal | ICD-10-CM | POA: Diagnosis present

## 2011-05-08 DIAGNOSIS — E785 Hyperlipidemia, unspecified: Secondary | ICD-10-CM | POA: Diagnosis present

## 2011-05-08 DIAGNOSIS — D649 Anemia, unspecified: Secondary | ICD-10-CM | POA: Diagnosis present

## 2011-05-08 DIAGNOSIS — R079 Chest pain, unspecified: Secondary | ICD-10-CM

## 2011-05-08 DIAGNOSIS — T82897A Other specified complication of cardiac prosthetic devices, implants and grafts, initial encounter: Secondary | ICD-10-CM | POA: Diagnosis present

## 2011-05-08 DIAGNOSIS — Y840 Cardiac catheterization as the cause of abnormal reaction of the patient, or of later complication, without mention of misadventure at the time of the procedure: Secondary | ICD-10-CM | POA: Diagnosis present

## 2011-05-08 DIAGNOSIS — F17200 Nicotine dependence, unspecified, uncomplicated: Secondary | ICD-10-CM | POA: Diagnosis present

## 2011-05-08 DIAGNOSIS — J189 Pneumonia, unspecified organism: Secondary | ICD-10-CM | POA: Diagnosis present

## 2011-05-08 DIAGNOSIS — Z8249 Family history of ischemic heart disease and other diseases of the circulatory system: Secondary | ICD-10-CM

## 2011-05-08 DIAGNOSIS — Z7982 Long term (current) use of aspirin: Secondary | ICD-10-CM

## 2011-05-08 DIAGNOSIS — I2582 Chronic total occlusion of coronary artery: Secondary | ICD-10-CM | POA: Diagnosis present

## 2011-05-08 LAB — HEPARIN LEVEL (UNFRACTIONATED): Heparin Unfractionated: 0.34 IU/mL (ref 0.30–0.70)

## 2011-05-08 LAB — CBC
HCT: 30.3 % — ABNORMAL LOW (ref 39.0–52.0)
Hemoglobin: 10.2 g/dL — ABNORMAL LOW (ref 13.0–17.0)
RBC: 3.61 MIL/uL — ABNORMAL LOW (ref 4.22–5.81)
WBC: 13.1 10*3/uL — ABNORMAL HIGH (ref 4.0–10.5)

## 2011-05-08 LAB — CARDIAC PANEL(CRET KIN+CKTOT+MB+TROPI)
CK, MB: 7.5 ng/mL (ref 0.3–4.0)
Relative Index: 3.2 — ABNORMAL HIGH (ref 0.0–2.5)
Total CK: 234 U/L — ABNORMAL HIGH (ref 7–232)
Total CK: 270 U/L — ABNORMAL HIGH (ref 7–232)
Troponin I: 1.34 ng/mL (ref <0.30)

## 2011-05-08 LAB — DIFFERENTIAL
Basophils Absolute: 0 10*3/uL (ref 0.0–0.1)
Basophils Relative: 0 % (ref 0–1)
Lymphocytes Relative: 4 % — ABNORMAL LOW (ref 12–46)
Monocytes Absolute: 0.9 10*3/uL (ref 0.1–1.0)
Neutro Abs: 11.6 10*3/uL — ABNORMAL HIGH (ref 1.7–7.7)
Neutrophils Relative %: 88 % — ABNORMAL HIGH (ref 43–77)

## 2011-05-08 LAB — COMPREHENSIVE METABOLIC PANEL
AST: 24 U/L (ref 0–37)
BUN: 19 mg/dL (ref 6–23)
CO2: 26 mEq/L (ref 19–32)
Calcium: 9 mg/dL (ref 8.4–10.5)
Creatinine, Ser: 1.37 mg/dL — ABNORMAL HIGH (ref 0.50–1.35)
GFR calc Af Amer: >60 mL/min (ref >60)
GFR calc non Af Amer: 51 mL/min — ABNORMAL LOW (ref >60)
Total Bilirubin: 0.4 mg/dL (ref 0.3–1.2)

## 2011-05-08 LAB — TROPONIN I: Troponin I: 0.55 ng/mL (ref <0.30)

## 2011-05-08 LAB — CK TOTAL AND CKMB (NOT AT ARMC): Total CK: 189 U/L (ref 7–232)

## 2011-05-08 LAB — PROTIME-INR
INR: 1.32 (ref 0.00–1.49)
Prothrombin Time: 16.6 seconds — ABNORMAL HIGH (ref 11.6–15.2)

## 2011-05-08 LAB — LIPID PANEL: Cholesterol: 108 mg/dL (ref 0–200)

## 2011-05-09 ENCOUNTER — Other Ambulatory Visit: Payer: Self-pay | Admitting: Cardiovascular Disease

## 2011-05-09 DIAGNOSIS — I2510 Atherosclerotic heart disease of native coronary artery without angina pectoris: Secondary | ICD-10-CM

## 2011-05-09 DIAGNOSIS — I6529 Occlusion and stenosis of unspecified carotid artery: Secondary | ICD-10-CM

## 2011-05-09 DIAGNOSIS — I359 Nonrheumatic aortic valve disorder, unspecified: Secondary | ICD-10-CM

## 2011-05-09 LAB — CBC
Hemoglobin: 10 g/dL — ABNORMAL LOW (ref 13.0–17.0)
MCH: 28.4 pg (ref 26.0–34.0)
Platelets: 126 10*3/uL — ABNORMAL LOW (ref 150–400)
RBC: 3.52 MIL/uL — ABNORMAL LOW (ref 4.22–5.81)

## 2011-05-09 LAB — HEPARIN LEVEL (UNFRACTIONATED): Heparin Unfractionated: 0.14 IU/mL — ABNORMAL LOW (ref 0.30–0.70)

## 2011-05-09 LAB — POCT I-STAT 3, ART BLOOD GAS (G3+)
pCO2 arterial: 44.8 mmHg (ref 35.0–45.0)
pH, Arterial: 7.347 — ABNORMAL LOW (ref 7.350–7.450)
pO2, Arterial: 82 mmHg (ref 80.0–100.0)

## 2011-05-09 LAB — CARDIAC PANEL(CRET KIN+CKTOT+MB+TROPI)
CK, MB: 5.8 ng/mL — ABNORMAL HIGH (ref 0.3–4.0)
Relative Index: 2.1 (ref 0.0–2.5)
Troponin I: 1.02 ng/mL (ref <0.30)

## 2011-05-09 LAB — BASIC METABOLIC PANEL
CO2: 27 mEq/L (ref 19–32)
Calcium: 9 mg/dL (ref 8.4–10.5)
Chloride: 107 mEq/L (ref 96–112)
Creatinine, Ser: 1.07 mg/dL (ref 0.50–1.35)
GFR calc Af Amer: >60 mL/min (ref >60)
Sodium: 138 mEq/L (ref 135–145)

## 2011-05-10 LAB — BASIC METABOLIC PANEL
BUN: 18 mg/dL (ref 6–23)
CO2: 25 mEq/L (ref 19–32)
Chloride: 108 mEq/L (ref 96–112)
GFR calc non Af Amer: 57 mL/min — ABNORMAL LOW (ref >60)
Glucose, Bld: 138 mg/dL — ABNORMAL HIGH (ref 70–99)
Potassium: 3.6 mEq/L (ref 3.5–5.1)
Sodium: 141 mEq/L (ref 135–145)

## 2011-05-10 LAB — CBC
HCT: 28.5 % — ABNORMAL LOW (ref 39.0–52.0)
Hemoglobin: 9.7 g/dL — ABNORMAL LOW (ref 13.0–17.0)
RBC: 3.37 MIL/uL — ABNORMAL LOW (ref 4.22–5.81)

## 2011-05-13 ENCOUNTER — Encounter: Payer: Self-pay | Admitting: Cardiovascular Disease

## 2011-05-13 ENCOUNTER — Ambulatory Visit (INDEPENDENT_AMBULATORY_CARE_PROVIDER_SITE_OTHER): Payer: Medicare Other | Admitting: Cardiovascular Disease

## 2011-05-13 ENCOUNTER — Encounter (INDEPENDENT_AMBULATORY_CARE_PROVIDER_SITE_OTHER): Payer: Medicare Other | Admitting: *Deleted

## 2011-05-13 DIAGNOSIS — R0989 Other specified symptoms and signs involving the circulatory and respiratory systems: Secondary | ICD-10-CM

## 2011-05-13 DIAGNOSIS — I6529 Occlusion and stenosis of unspecified carotid artery: Secondary | ICD-10-CM

## 2011-05-13 DIAGNOSIS — I359 Nonrheumatic aortic valve disorder, unspecified: Secondary | ICD-10-CM

## 2011-05-13 DIAGNOSIS — E7800 Pure hypercholesterolemia, unspecified: Secondary | ICD-10-CM

## 2011-05-13 DIAGNOSIS — I2510 Atherosclerotic heart disease of native coronary artery without angina pectoris: Secondary | ICD-10-CM

## 2011-05-13 LAB — POCT I-STAT 3, VENOUS BLOOD GAS (G3P V)
Bicarbonate: 24.2 mEq/L — ABNORMAL HIGH (ref 20.0–24.0)
O2 Saturation: 71 %
TCO2: 26 mmol/L (ref 0–100)
pCO2, Ven: 48.5 mmHg (ref 45.0–50.0)
pO2, Ven: 41 mmHg (ref 30.0–45.0)

## 2011-05-13 NOTE — Assessment & Plan Note (Signed)
Stable carotid disease by duplex.  Continue ASA  Duplex in 6 months

## 2011-05-13 NOTE — Assessment & Plan Note (Signed)
Cholesterol is at goal.  Continue current dose of statin and diet Rx.  No myalgias or side effects.  F/U  LFT's in 6 months. Lab Results  Component Value Date   LDLCALC 63 05/08/2011

## 2011-05-13 NOTE — Assessment & Plan Note (Signed)
Severe AS with CAD.  In my mind he is a candidate for AVR/CABG.  Not evaluated by CVTS in hospital.  Patient clearly states he does not want surgery.  Do best we can to Rx medically

## 2011-05-13 NOTE — Patient Instructions (Addendum)
Your physician wants you to follow-up in: 3 months. You will receive a reminder letter in the mail two months in advance. If you don't receive a letter, please call our office to schedule the follow-up appointment.   

## 2011-05-13 NOTE — Progress Notes (Signed)
77 yo of Maxwell Copeland.  I do not follow him regularly.  Just D/C from hospital.  Was visiting his younger brother who just had CABG/AVR and got SSCP.  In hospital cared for by Maxwell Elease Hashimoto who did his cath and CM.  R/O .  Reviewed cath films.  Stents in circu and lad patent.  Tight OM1 and distal circumflex disease.  Moderate mid LAD and D1 disease.  Apparantly decision made by Maxwell Elease Hashimoto and Luanna Salk to Rx medically.  The patient has known severe AS.  Reviewed echo from 11/01/10.  EF normal.  Mean gradient 39 and peak 67 mmHG.  Since D/C some exertional fatigue but no signs of CHF or Angina.  Had a carotid duplex in our office today.  Reviewed.  Stable 60-79% RICA disease and 40-59% LICA.  No TIA symptoms.    Long discussion after review of all the data.  Patient does not want CABG/AVR.  He is scared he wont make it and didn't like the way his brother looked.  He understands the future risk of MI and CHF.  He is doing everything he wants to do with very little limitation.  I did not recommend TAVR to him either as this would not address his CAD.  I did tell him that I thought he was a candidate for combined AVR/CABG but he clearly has made a decision to defer.    ROS: Denies fever, malais, weight loss, blurry vision, decreased visual acuity, cough, sputum, SOB, hemoptysis, pleuritic pain, palpitaitons, heartburn, abdominal pain, melena, lower extremity edema, claudication, or rash.  All other systems reviewed and negative   General: Affect appropriate Chronically ill HEENT: normal Neck supple with no adenopathy JVP normal bilateral  bruits no thyromegaly Lungs clear with no wheezing and good diaphragmatic motion Heart:  S1/S2 diminished  Severe AS murmur no ,rub, gallop or click PMI normal Abdomen: benighn, BS positve, no tenderness, no AAA no bruit.  No HSM or HJR Distal pulses intact with no bruits No edema Neuro non-focal Skin warm and dry No muscular weakness  Medications Current Outpatient  Prescriptions  Medication Sig Dispense Refill  . amLODipine (NORVASC) 10 MG tablet Take 10 mg by mouth daily.        Marland Kitchen aspirin 325 MG tablet Take 81 mg by mouth daily.       . clopidogrel (PLAVIX) 75 MG tablet Take 75 mg by mouth daily.        . isosorbide mononitrate (IMDUR) 30 MG 24 hr tablet Take 30 mg by mouth daily.        Marland Kitchen levothyroxine (SYNTHROID, LEVOTHROID) 112 MCG tablet Take 112 mcg by mouth daily.        . metoprolol (LOPRESSOR) 50 MG tablet Take 50 mg by mouth daily.        . niacin (SLO-NIACIN) 500 MG tablet Take 500 mg by mouth at bedtime.        . nitroGLYCERIN (NITROSTAT) 0.4 MG SL tablet Place 1 tablet (0.4 mg total) under the tongue every 5 (five) minutes as needed for chest pain.  25 tablet  1  . NON FORMULARY COMPLEX B W/FOLIC VIT B-6 AND B-12 1 TAB PO QD       . ranitidine (ZANTAC) 150 MG tablet Take 150 mg by mouth daily.        . rosuvastatin (CRESTOR) 40 MG tablet Take 40 mg by mouth daily.        . vitamin E (VITAMIN E) 400 UNIT capsule Take  400 Units by mouth daily.          Allergies Review of patient's allergies indicates not on file.  Family History: Family History  Problem Relation Age of Onset  . Coronary artery disease      Social History: History   Social History  . Marital Status: Widowed    Spouse Name: N/A    Number of Children: N/A  . Years of Education: N/A   Occupational History  . retired    Social History Main Topics  . Smoking status: Former Smoker    Quit date: 09/09/1987  . Smokeless tobacco: Not on file  . Alcohol Use: Yes  . Drug Use: No  . Sexually Active: Not on file   Other Topics Concern  . Not on file   Social History Narrative  . No narrative on file    Electrocardiogram: 09/15/2009  NSR HR 56 normal  Assessment and Plan

## 2011-05-13 NOTE — Assessment & Plan Note (Signed)
Reviewed most recent angiogram.  I think High OM1 was tightest lesion.  Consider culprit PCI in future if SSCP returns

## 2011-05-14 LAB — CULTURE, BLOOD (ROUTINE X 2)
Culture  Setup Time: 201208301404
Culture: NO GROWTH
Culture: NO GROWTH

## 2011-05-15 ENCOUNTER — Ambulatory Visit (INDEPENDENT_AMBULATORY_CARE_PROVIDER_SITE_OTHER): Payer: Medicare Other | Admitting: Internal Medicine

## 2011-05-15 ENCOUNTER — Encounter: Payer: Self-pay | Admitting: Internal Medicine

## 2011-05-15 DIAGNOSIS — I6350 Cerebral infarction due to unspecified occlusion or stenosis of unspecified cerebral artery: Secondary | ICD-10-CM

## 2011-05-15 DIAGNOSIS — E039 Hypothyroidism, unspecified: Secondary | ICD-10-CM

## 2011-05-15 DIAGNOSIS — E7800 Pure hypercholesterolemia, unspecified: Secondary | ICD-10-CM

## 2011-05-15 DIAGNOSIS — I2510 Atherosclerotic heart disease of native coronary artery without angina pectoris: Secondary | ICD-10-CM

## 2011-05-15 NOTE — Progress Notes (Signed)
Subjective:    Patient ID: Maxwell Copeland, male    DOB: April 04, 1932, 77 y.o.   MRN: [id number]  HPI Maxwell Copeland presents to establish for continuity care. He was recently hospitalized for CAD/MI with d/c Sept 1. He had multi-vessel disease and in-stent stenosis but he declined surgery and further PCI wasn't deemed to be helpful. He reports that he is pain free and he remains active and independent on his medical regimen.  Past Medical History  Diagnosis Date  . Coronary artery disease     LAD w/ Ca++, 100% RCA, in-astent stenosis Cx, high grade lesion Ramus  . Hypercholesteremia   . Dyspnea   . Hypothyroid   . CVA (cerebral infarction)   . Arthritis     hips, will give out  . Varicella   . Depression     never took any medication.  . Emphysema of lung     smoking related.  . Hypertension   . Stroke     left hemiparesis-100% recovery   No past surgical history on file. Family History  Problem Relation Age of Onset  . Coronary artery disease    . Hyperlipidemia Mother   . Heart disease Mother     had artifical valve  . Heart disease Brother   . Hyperlipidemia Brother   . COPD Brother   . Heart disease Brother     CABG, valve replacement  . Alcohol abuse Brother    History   Social History  . Marital Status: Widowed    Spouse Name: N/A    Number of Children: N/A  . Years of Education: N/A   Occupational History  . retired    Social History Main Topics  . Smoking status: Former Smoker    Quit date: 09/09/1987  . Smokeless tobacco: Current User  . Alcohol Use: No  . Drug Use: No  . Sexually Active: Not Currently   Other Topics Concern  . Not on file   Social History Narrative  . No narrative on file       Review of Systems Review of Systems  Constitutional:  Negative for fever, chills, activity change and unexpected weight change.  HEENT:  Negative for hearing loss, ear pain, congestion, neck stiffness and postnasal drip. Negative for sore throat  or swallowing problems. Negative for dental complaints.   Eyes: Negative for vision loss or change in visual acuity.  Respiratory: Negative for chest tightness and wheezing.   Cardiovascular: Negative for chest pain and palpitation. Positive for decreased exercise tolerance Gastrointestinal: No change in bowel habit. No bloating or gas. No reflux or indigestion Genitourinary: Negative for urgency, frequency, flank pain and difficulty urinating.  Musculoskeletal: Negative for myalgias, back pain, arthralgias and gait problem.  Neurological: Negative for dizziness, tremors, weakness and headaches.  Hematological: Negative for adenopathy.  Psychiatric/Behavioral: Negative for behavioral problems and dysphoric mood.       Objective:   Physical Exam Vitals noted - good BP control, normal O2 sat. Gen'l - older white male in no distress HEENT- appears to be missing many teeth. Cor - 2+ radial pulse, RRR Resp - normal Neuro- A&O x 3, CN II-XII with normal facial symmetry, MS grossly normal with no apparent left sided weakness from CVA, normal gait.  Lab Results  Component Value Date   WBC 6.5 05/10/2011   HGB 9.7* 05/10/2011   HCT 28.5* 05/10/2011   PLT 120* 05/10/2011   CHOL 108 05/08/2011   TRIG 34 05/08/2011   HDL 38*  05/08/2011   ALT 13 05/08/2011   AST 24 05/08/2011   NA 141 05/10/2011   K 3.6 05/10/2011   CL 108 05/10/2011   CREATININE 1.23 05/10/2011   BUN 18 05/10/2011   CO2 25 05/10/2011   TSH 0.094* 05/08/2011   INR 1.32 05/08/2011          Assessment & Plan:  Summary - a nice man with multiple medical problems who seems stable at today's visit. He is to return in the near future for more complete exam and will review thyroid function as well as all his medications at that time.

## 2011-05-18 ENCOUNTER — Encounter: Payer: Self-pay | Admitting: Internal Medicine

## 2011-05-18 NOTE — Assessment & Plan Note (Signed)
Well controlled with LDL 65 Sept 1st.  Plan - continue present medication           Follow-up lab in 6 months

## 2011-05-18 NOTE — Assessment & Plan Note (Signed)
Last TSH low at 0.09. Will recheck at next office visit to determine if he needs a lower dose of replacement.

## 2011-05-18 NOTE — Assessment & Plan Note (Signed)
He is followed closely by Dr. Eden Emms and appears to be stable on his current regimen

## 2011-05-18 NOTE — Assessment & Plan Note (Signed)
He has made a good recovery with no apparent sequellae.  Plan - continue risk factor modification with current medications.

## 2011-05-19 NOTE — H&P (Addendum)
[name], Copeland NO.:  [id number]  MEDICAL RECORD NO.:  [id number]  LOCATION:  6533                         FACILITY:  [facility]  PHYSICIAN:  Bevelyn Buckles. Cami Delawder, MDDATE OF BIRTH:  08-19-1932  DATE OF ADMISSION:  05/08/2011 DATE OF DISCHARGE:                             HISTORY & PHYSICAL   PRIMARY CARDIOLOGIST:  Theron Arista C. Eden Emms, MD, [facility]  CHIEF COMPLAINT:  Chest pain.  HISTORY OF PRESENT ILLNESS:  [name] is a 77 year old gentleman with a history of CAD, aortic stenosis, and peripheral vascular disease who was here visiting his brother last night.  He felt cold and a shaky sensation, so he went home.  There, he noted his chest began hurting. He took nitroglycerin without relief.  His daughter came over and convinced him to call 911.  He does have chronic shortness of breath and dyspnea on exertion.  He also endorses an exertional component to his chest pain.  He had presyncope about a month ago after being out in the sun.  He felt hot, dizzy, and sweaty and sat down and got drink with some improvement in his symptoms.  Currently, he is without chest pain but does have some nausea.  No vomiting.  He endorses that he cannot get warm.  He is afebrile but does have a white blood cell count of 13.1. First troponin is 0.55.  He also has some renal insufficiency with a creatinine of 1.37.  PAST MEDICAL HISTORY: 1. CAD.     a.     Status post drug-eluting stent to the circumflex in 2004.     b.     Status post PCI and cutting balloon of in-stent restenosis,      as well as drug-eluting stent placement to the proximal      circumflex, January 2011. 2. LVEF of 55-60% by echo February 2012 with grade 2 diastolic     dysfunction. 3. Severe aortic stenosis with AVA of 0.85 cm2, BPI at 1 cm2, VMax     with a mean gradient 39 mmHg.  Dr. Eden Emms has said in previous     notes that he is not an ideal candidate for AVR, but this would     have to be discussed along  possible TAVR. 4. Dyslipidemia. 5. Prior CVA. 6. Hypothyroidism. 7. Hypertension. 8. Peripheral vascular disease with carotid Dopplers this year     demonstrating right 60-79% ICA stenosis.  MEDICATIONS: 1. Vitamin E 400 units daily. 2. Aspirin 81 mg daily. 3. Niacin SR 500 mg daily. 4. Ranitidine 150 mg daily. 5. Plavix 75 mg daily. 6. Levothyroxine 112 mcg daily. 7. Amlodipine 10 mg daily. 8. Crestor 40 mg daily.  ALLERGIES:  No known drug allergies.  SOCIAL HISTORY:  The patient is widowed.  He lives with his son and daughter.  He is retired.  He is a former smoker and quit, but occasionally chews.  He endorses occasional alcohol use.  FAMILY HISTORY:  Positive for coronary artery disease.  REVIEW OF SYSTEMS:  No fevers, although he has had some chills.  No cough, orthopnea, PND, or palpitations.  No overt symptoms but has had presyncope as above.  All other systems are reviewed  and otherwise negative.  LABORATORY DATA:  WBC 13.1, hemoglobin 7.2, hematocrit 30.3, and platelets 133.  Sodium 136, potassium 3.8, chloride 104, CO2 of 25, glucose 104, BUN 19, and creatinine 1.37.  LFTs were within normal limits.  CK 189, MB 5.1, and troponin 0.55.  EKG:  Normal sinus rhythm with slight ST scooping in V3 through V5 with Q-waves in II, III, aVF.  RADIOLOGIC STUDIES:  Chest x-ray showed bilateral lower lobe atelectasis, more patchy on the left side, could indicate superimposed pneumonia, recommend re-x-ray.  PHYSICAL EXAMINATION:  VITAL SIGNS:  Temperature 98.5, pulse 73, respirations 16, blood pressure 106/54, and pulse oximetry 97% on 2 liters. GENERAL:  This is a pleasant elderly white male in no acute distress. HEENT:  Normocephalic and atraumatic.  Extraocular movements intact. Clear sclerae.  Nares without discharge. NECK:  Supple. HEART:  Auscultation of the heart reveals regular rate and rhythm with a 2/6 systolic ejection murmur at the right upper sternal border  without S2 that is audible. LUNGS:  Clear to auscultation bilaterally without wheezes, rales, or rhonchi. ABDOMEN:  Soft, nontender, and nondistended.  Positive bowel sounds. EXTREMITIES:  Warm and dry without edema.  Pedal pulses 2+ bilaterally. NEUROLOGIC:  He is alert and oriented x3.  Responds to questions appropriately with a normal affect.  ASSESSMENT AND PLAN:  The patient was seen and examined by Dr. Gala Romney and myself.  This is a 77 year old gentleman gentleman with a history of known coronary artery disease, severe aortic stenosis, prior cerebrovascular accident who presents to [facility] with complaints of feeling cold, as well as exertional chest pain and shortness of breath.  He has ruled in for an non-ST-elevation myocardial infarction with a troponin 0.55 thus far.  His symptoms are felt likely combination of both aortic stenosis and non-ST-elevation myocardial infarction.  He will require catheterization but the timing of this will be discussed between Dr. Eden Emms and Dr. Gala Romney.  He does have some renal insufficiency today, so we will gently hydrate him.  We will hold off on his Norvasc as well as any other antihypertensive medications given his borderline low blood pressure.  Heparin will be initiated.  He also has leukocytosis and complaints of chills with questionable superimposed pneumonia on chest x-ray, so we will obtain blood cultures and empirically start Avelox as well.  The definitive decision will be made early this afternoon and the patient will be kept n.p.o. until then.     Dayna Dunn, P.A.C.   ______________________________ Bevelyn Buckles. Undrea Archbold, MD    DD/MEDQ  D:  05/08/2011  T:  05/08/2011  Job:  [id number]  cc:   Noralyn Pick. Eden Emms, MD, [facility]  Electronically Signed by Ronie Spies  on 05/19/2011 07:35:38 PM Electronically Signed by Arvilla Meres MD on 05/28/2011 10:38:28 PM

## 2011-05-19 NOTE — Cardiovascular Report (Signed)
NAMEZARIF, RATHJE NO.:  [id number]  MEDICAL RECORD NO.:  [id number]  LOCATION:                                 FACILITY:  PHYSICIAN:  Vesta Mixer, M.D. DATE OF BIRTH:  1932-04-11  DATE OF PROCEDURE: DATE OF DISCHARGE:                           CARDIAC CATHETERIZATION   Maxwell Copeland is a 77 year old gentleman with a history of aortic stenosis and a history of coronary artery disease.  He has had two circumflex stents placed in the past.  He was admitted with progressive shortness of breath, generalized weakness, and some chest pain.  Please see dictated H and P for further details.  The procedure today is right and left heart catheterization.  We performed coronary angiography and right heart pressures.  We were unable to cross the aortic valve with the catheter.  The right femoral artery and right femoral veins were easily cannulated using modified Seldinger technique.  We had some difficulty getting up to central aorta, so a Wholey wire was used and all catheters were exchanged out over a guidewire.  Hemodynamics:  The RA pressure is 11/10 with a mean of 9.  Right ventricular pressure is 37/6.  Pulmonary capillary wedge pressure is 15. Pulmonary artery pressure is 34/17 with a mean of 23.  Cardiac output by thermodilution is 4.9 liters per minute with an index of 2.7 liters per minute.  The Fick cardiac output is probably inaccurate because the patient had just been on oxygen.  We stopped the oxygen and performed our measurements approximately 5 minutes later.  His aortic saturation was 95%.  His PA saturation was 71%.  The cardiac output by Fick calculation was 7.46 liters per minute with an index of 4.08.  We were able to cross the aortic valve twice with the wire, but had difficulty in passing the Amplatz left two 5-French catheter over a wire.  There was lots of friction.  We were unable to reliably trade out for pigtail catheter.  We  have adequate echo data to suggest that the patient has known severe aortic stenosis.  Angiography:  The left main has mild calcifications.  There is no significant stenosis.  The left anterior descending artery is very calcified.  There is a very long complex irregular stenosis involving the proximal mid LAD.  This stenosis ranges between 75% and 80%.  It involves the first diagonal vessel.  The remaining mid and distal LAD has minor luminal irregularities.  The first diagonal artery has a 75%-80% stenosis in the proximal segment.  The left circumflex artery is large and dominant.  There is a very high obtuse marginal artery that is subtotaled.  It is very hazy.  There is TIMI grade 3 flow down this vessel.  The proximal and mid stents are patent.  There is a 40%-50% stenosis between these two stents.  The distal LAD circumflex artery has a 70% stenosis in the proximal posterior descending artery.  The remaining posterolateral branches are fairly normal.  The collateral filling can be seen coming from the circumflex vessel to the right coronary artery.  Right coronary artery:  The right coronary artery is very small and is nondominant.  It is occluded  at its mid segment.  There are collaterals coming from the right-to-right system and also left-to-right collaterals.  No left ventriculogram was performed secondary to inability to cross the valve.  COMPLICATIONS:  None.  CONCLUSION: 1. Severe coronary artery disease.  The patient has moderate-to-severe     coronary artery disease involving the left anterior descending     artery, left circumflex artery, and an occluded right coronary     artery.  I do not think that these LAD and diagonal lesions are     amenable to the angioplasty/stenting.  They are very long and     complex and also occur at a bifurcation. 2. The patient has severe aortic stenosis by echocardiography. 3. He has normal cardiac output by right heart cath.   He has normal     right ventricular pressures by right heart cath.  The patient appears to be a relatively poor candidate for coronary artery bypass grafting and AVR.  I will leave that final decision up to his primary cardiologist Dr. Charlton Haws.  He also appears to be perhaps not a very good candidate for TAVI given his degree of his coronary artery disease.  We will continue medical treatment for now.     Vesta Mixer, M.D.     PJN/MEDQ  D:  05/09/2011  T:  05/09/2011  Job:  [id number]  cc:   Noralyn Pick. Eden Emms, MD, [facility] Bevelyn Buckles. Bensimhon, MD  Electronically Signed by Kristeen Miss M.D. on 05/19/2011 07:45:01 PM

## 2011-06-06 ENCOUNTER — Telehealth: Payer: Self-pay | Admitting: Cardiovascular Disease

## 2011-06-06 DIAGNOSIS — I2510 Atherosclerotic heart disease of native coronary artery without angina pectoris: Secondary | ICD-10-CM

## 2011-06-06 MED ORDER — ISOSORBIDE MONONITRATE ER 30 MG PO TB24: 30.0000 mg | ORAL_TABLET | Freq: Every day | ORAL | Status: DC

## 2011-06-06 NOTE — Telephone Encounter (Signed)
PT NEEDS [place] TO FAX ISOSORBIDE AND LAST OFFICE NOTE TO [facility] [place] FAX [phone] CALL [phone].

## 2011-06-06 NOTE — Telephone Encounter (Signed)
Spoke with patient's daughter June, she states pt. Needs for the office to fax a prescription for Imdur 30 mg once a day and last office visit with Dr. Eden Emms to the [place] in [place] . Prescription and last office note faxed as requested. June is aware.

## 2011-06-11 NOTE — Discharge Summary (Signed)
NAMEJAHEIM, CANINO NO.:  [id number]  MEDICAL RECORD NO.:  [id number]  LOCATION:  6533                         FACILITY:  [facility]  PHYSICIAN:  Gerrit Friends. Dietrich Pates, MD, FACCDATE OF BIRTH:  09/21/1931  DATE OF ADMISSION:  05/08/2011 DATE OF DISCHARGE:  05/10/2011                              DISCHARGE SUMMARY   PRIMARY CARDIOLOGIST:  Theron Arista C. Eden Emms, MD, [facility].  DISCHARGE DIAGNOSIS: 1. Severe multivessel coronary artery disease.     a.     Status post cardiac catheterization this admission, medical      therapy recommended.     b.     Status post non-ST elevation myocardial infarction (peak      troponin-I 1.4)  SECONDARY DIAGNOSES: 1. Severe aortic stenosis.     a.     By prior echocardiography. 2. History of normal ventricular function. 3. Hypertension. 4. Hyperlipidemia. 5. Anemia. 6. History of stroke. 7. Hypothyroidism.  REASON FOR ADMISSION:  Mr. Gander is a 77 year old male, with known coronary artery disease, status post prior percutaneous interventions, and known severe aortic stenosis, who presented with symptoms suggestive of unstable angina pectoris.  Initial cardiac markers were abnormal, with a troponin of 0.6, and he was admitted for aggressive treatment  and further evaluation.  HOSPITAL COURSE:  The patient was placed on intravenous heparin and cardiac markers were cycled, yielding a peak troponin I of 1.4.  Plan was to proceed with coronary angiography, following adequate hydration, and this was performed on hospital day #1.  The initial procedure was performed by Dr. Elease Hashimoto, and films were reviewed by Dr. Clifton James, who noted high-grade, calcified LAD/diagonal disease; occluded RCA; moderate  in-stent restenosis of the mid circumflex; and, subtotal occlusion of the  ramus intermedius branch.  Following review of the films, Dr. Clifton James discussed at length with the patient and his daughter the available options.  He felt that  the patient was a poor surgical candidate, but does have surgical disease and severe aortic stenosis.  He recommended consideration for surgical consultation and, if he were turned down, proceed with medical management.   He also suggested that the PCI of the ramus intermedius would offer  no real benefit, but could provide symptomatic relief.  In summary,  Dr. Clifton James concluded that the patient has a poor prognosis, and would confer with Dr. Eden Emms.  On scheduled day of discharge, the patient was hemodynamically stable and without complaint of chest pain.  He was allowed to ambulate, reporting no associated chest discomfort.  He had stable right groin incision site on examination, although it was noted that he has soft bilateral femoral  bruits, but with no evidence of hematoma.  Medication adjustments this admission:  Addition of Imdur.  DISCHARGE LABS:  WBC 6.5, hemoglobin 9.7, hematocrit 29 (MCV 85), and platelet 120.  Sodium 141, potassium 3.6, BUN 18, and creatinine 1.2.  OUTSTANDING LABS:  Peak troponin-I 1.4.  Lipid profile:  Total cholesterol 108, triglyceride 34, HDL 38, and LDL 63.  TSH 0.09.  normal LFTs.  Admission chest x-ray:  Bilateral lower lobe atelectasis, with more patchy left lower lobe pulmonary opacity (question of superimposed pneumonia).  DISPOSITION:  Stable.  FOLLOWUP:  Dr. Theron Arista  Nishan on Tuesday, September 4, as previously scheduled.  DISCHARGE MEDICATIONS: 1. Aspirin 81 daily. 2. Plavix 75 daily. 3. Imdur 30 daily. 4. Amlodipine 10 daily. 5. Crestor 40 daily. 6. Levothyroxine 0.112 daily. 7. Niacin SR 500 daily. 8. Ranitidine 150 at bedtime. 9. Nitrostat 0.4 p.r.n.  DURATION OF DISCHARGE ENCOUNTER:  Greater than 30 minutes, including physician time.     Gene Serpe, PA-C   ______________________________ Gerrit Friends. Dietrich Pates, MD, [facility]    GS/MEDQ  D:  05/10/2011  T:  05/10/2011  Job:  [id number]  Electronically Signed by Rozell Searing  PA-C on 05/13/2011 01:41:02 PM Electronically Signed by [place] Bing MD [facility] on 06/11/2011 10:30:43 AM

## 2011-08-14 ENCOUNTER — Encounter: Payer: Self-pay | Admitting: Cardiovascular Disease

## 2011-08-14 ENCOUNTER — Ambulatory Visit (INDEPENDENT_AMBULATORY_CARE_PROVIDER_SITE_OTHER): Payer: Medicare Other | Admitting: Cardiovascular Disease

## 2011-08-14 DIAGNOSIS — E7800 Pure hypercholesterolemia, unspecified: Secondary | ICD-10-CM

## 2011-08-14 DIAGNOSIS — I359 Nonrheumatic aortic valve disorder, unspecified: Secondary | ICD-10-CM

## 2011-08-14 DIAGNOSIS — I2510 Atherosclerotic heart disease of native coronary artery without angina pectoris: Secondary | ICD-10-CM

## 2011-08-14 DIAGNOSIS — I350 Nonrheumatic aortic (valve) stenosis: Secondary | ICD-10-CM

## 2011-08-14 NOTE — Patient Instructions (Signed)
Your physician wants you to follow-up in: 6 MONTHS WITH DR Eden Emms  AND ECHO  SAME DAY  You will receive a reminder letter in the mail two months in advance. If you don't receive a letter, please call our office to schedule the follow-up appointment. Your physician recommends that you continue on your current medications as directed. Please refer to the Current Medication list given to you today.  Your physician has requested that you have an echocardiogram. Echocardiography is a painless test that uses sound waves to create images of your heart. It provides your doctor with information about the size and shape of your heart and how well your heart's chambers and valves are working. This procedure takes approximately one hour. There are no restrictions for this procedure. IN 6 MONTHS  DX

## 2011-08-14 NOTE — Assessment & Plan Note (Signed)
No angina but sedentary.  Has nitro.  Refuses CABG/.AVR  Understands risk of MI

## 2011-08-14 NOTE — Progress Notes (Signed)
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 I do not follow him regularly. Just D/C from hospital. Was visiting his younger brother who just had CABG/AVR and got SSCP. In hospital cared for by Dr Elease Hashimoto who did his cath and CM. R/O . Reviewed cath films. Stents in circu and lad patent. Tight OM1 and distal circumflex disease. Moderate mid LAD and D1 disease. Apparantly decision made by Dr Elease Hashimoto and Luanna Salk to Rx medically. The patient has known severe AS. Reviewed echo from 11/01/10. EF normal. Mean gradient 39 and peak 67 mmHG. Since D/C some exertional fatigue but no signs of CHF or Angina. Had a carotid duplex in our office today. Reviewed. Stable 60-79% RICA disease and 40-59% LICA. No TIA symptoms.  Long discussion after review of all the data. Patient does not want CABG/AVR. He is scared he wont make it and didn't like the way his brother looked. He understands the future risk of MI and CHF. He is doing everything he wants to do with very little limitation. I did not recommend TAVR to him either as this would not address his CAD. I did tell him that I thought he was a candidate for combined AVR/CABG but he clearly has made a decision to defer.    ROS: Denies fever, malais, weight loss, blurry vision, decreased visual acuity, cough, sputum, SOB, hemoptysis, pleuritic pain, palpitaitons, heartburn, abdominal pain, melena, lower extremity edema, claudication, or rash.  All other systems reviewed and negative  General: Affect appropriate Chronically ill HEENT: normal Neck supple with no adenopathy JVP normal no bruits no thyromegaly Lungs clear with no wheezing and good diaphragmatic motion Heart:  S1/S2 decreased with AS  murmur,rub, gallop or click PMI normal Abdomen: benighn, BS positve, no tenderness, no AAA no bruit.  No HSM or HJR Distal pulses intact with no bruits No edema Neuro non-focal Skin warm and dry No muscular weakness   Current Outpatient Prescriptions  Medication Sig Dispense Refill  .  amLODipine (NORVASC) 10 MG tablet Take 10 mg by mouth daily.        Marland Kitchen aspirin 325 MG tablet Take 81 mg by mouth daily.       . clopidogrel (PLAVIX) 75 MG tablet Take 75 mg by mouth daily.        . isosorbide mononitrate (IMDUR) 30 MG 24 hr tablet Take 1 tablet (30 mg total) by mouth daily.  90 tablet  3  . levothyroxine (SYNTHROID, LEVOTHROID) 112 MCG tablet Take 112 mcg by mouth daily.        . metoprolol (LOPRESSOR) 50 MG tablet Take 50 mg by mouth daily.        . niacin (SLO-NIACIN) 500 MG tablet Take 500 mg by mouth at bedtime.        . nitroGLYCERIN (NITROSTAT) 0.4 MG SL tablet Place 1 tablet (0.4 mg total) under the tongue every 5 (five) minutes as needed for chest pain.  25 tablet  1  . NON FORMULARY COMPLEX B W/FOLIC VIT B-6 AND B-12 1 TAB PO QD       . ranitidine (ZANTAC) 150 MG tablet Take 150 mg by mouth daily.        . rosuvastatin (CRESTOR) 40 MG tablet Take 40 mg by mouth daily.        . vitamin E (VITAMIN E) 400 UNIT capsule Take 400 Units by mouth daily.          Allergies  Review of patient's allergies indicates no known allergies.  Electrocardiogram:  NSR 53  LVH  Q in 3, and F  Assessment and Plan

## 2011-08-14 NOTE — Assessment & Plan Note (Signed)
Cholesterol is at goal.  Continue current dose of statin and diet Rx.  No myalgias or side effects.  F/U  LFT's in 6 months. Lab Results  Component Value Date   LDLCALC 63 05/08/2011             

## 2011-08-14 NOTE — Assessment & Plan Note (Signed)
Severe AS by echo criteria  Would not consider TAVR given comcomitant CAD.  Patient to think about surgery but currently refusing

## 2011-10-22 ENCOUNTER — Ambulatory Visit: Payer: Medicare Other | Admitting: Internal Medicine

## 2011-11-26 ENCOUNTER — Encounter: Payer: Self-pay | Admitting: Internal Medicine

## 2011-11-26 ENCOUNTER — Ambulatory Visit (INDEPENDENT_AMBULATORY_CARE_PROVIDER_SITE_OTHER): Payer: Medicare Other | Admitting: Internal Medicine

## 2011-11-26 ENCOUNTER — Ambulatory Visit (INDEPENDENT_AMBULATORY_CARE_PROVIDER_SITE_OTHER)
Admission: RE | Admit: 2011-11-26 | Discharge: 2011-11-26 | Disposition: A | Discharge status: Home / Self Care | Payer: Medicare Other | Source: Ambulatory Visit | Attending: Internal Medicine | Admitting: Internal Medicine

## 2011-11-26 VITALS — BP 100/62 | HR 54 | Temp 96.4°F | Resp 16 | Wt 156.0 lb

## 2011-11-26 DIAGNOSIS — R0602 Shortness of breath: Secondary | ICD-10-CM

## 2011-11-26 DIAGNOSIS — I359 Nonrheumatic aortic valve disorder, unspecified: Secondary | ICD-10-CM

## 2011-11-26 DIAGNOSIS — I2510 Atherosclerotic heart disease of native coronary artery without angina pectoris: Secondary | ICD-10-CM

## 2011-11-26 DIAGNOSIS — R059 Cough, unspecified: Secondary | ICD-10-CM

## 2011-11-26 DIAGNOSIS — R05 Cough: Secondary | ICD-10-CM

## 2011-11-26 NOTE — Assessment & Plan Note (Signed)
Persistent cough, DOE, decreased exercise tolerance/stamina. Last study (not found) is Feb '12.  Plan - follow-up 2 D echo to r/o LV dysfunction/dilation secondary to AS

## 2011-11-26 NOTE — Progress Notes (Signed)
  Subjective:    Patient ID: Maxwell Copeland, male    DOB: September 13, 1931, 78 y.o.   MRN: [id number]  HPI Maxwell Copeland has had a cough since Christmas. Cough is persistent productive of a clear sputum. NO fevers or chills. He has chronic shortness of breath. No weight change. No change in exercise tolerance. Former smoker but quit 20 years ago.   He is followed for Aortic stenosis and CAD. He last saw cardiology, Dr. Eden Emms, Aug 14, 2011. The patient does not want to consider CABG or AVR. Last Echo found is '10 with normal EF. Dr. Fabio Bering note refers to study Feb '12 but cannot locate this study in EPIC. Last CXR Aug '12 for pneumonia did reveal COPD changes and infiltrate.   PMH, FamHx and SocHx reviewed for any changes and relevance.    Review of Systems System review is negative for any constitutional, cardiac, pulmonary, GI or neuro symptoms or complaints other than as described in the HPI.     Objective:   Physical Exam Filed Vitals:   11/26/11 1601  BP: 100/62  Pulse: 54  Temp: 96.4 F (35.8 C)  Resp: 16  O2 sat 95% Wt Readings from Last 3 Encounters:  11/26/11 156 lb (70.761 kg)  08/14/11 157 lb (71.215 kg)  05/15/11 150 lb (68.04 kg)   Gen'l- older man in no distress but appears chronically ill HEENT- Normal Neck - supple, no thyromegaly, No JVD Cpor 2+ radial pulse, RRR, II/VI distant murmur heard best at the RSB Pulm - normal breath sounds except for some wet rhonchi left base. No rales, no wheezing.  Ext - no edema.  CHEST - 2 VIEW  Comparison: 05/08/2011  Findings: The heart size and mediastinal contours are within normal  limits. Lungs are hyperinflated and there are coarsened  interstitial markings. Both lungs are clear. The visualized  skeletal structures are unremarkable.  IMPRESSION:  1. No acute cardiopulmonary abnormalities.  2. COPD.  Original Report Authenticated By: Rosealee Albee, M.D.      Assessment & Plan:  Cough - see problem list - may  be allergi rhinitis with postnasal drainage. Plan - will start otc Claritin 10 mg once a day.

## 2011-11-26 NOTE — Patient Instructions (Signed)
Prolonged cough - doubt infection with duration of cough, absence of fever, absence of productive sputum. Concern for heart cause - will check a echocardiogram to see if there has been progressive changes in heart function from the Coronary disease or valvular disease. May be lung problem - will check chest x-ray today. May be allergy - please get over the counter claritin - the generic is loratadine- 10 mg once a day. You will be notified about appointment for echo and you will get a report on the chest x-ray.

## 2011-11-26 NOTE — Assessment & Plan Note (Signed)
COPD by previous x-ray. Remotely (20 years) smoker but no sign of weight loss, sweats or other symptoms of malignancy.  Plan - CXR

## 2011-11-26 NOTE — Assessment & Plan Note (Signed)
No chest pain. NO acute changes in functional status.  Plan - continue current medications.

## 2011-12-01 ENCOUNTER — Encounter (HOSPITAL_COMMUNITY): Payer: Self-pay | Admitting: Internal Medicine

## 2011-12-02 ENCOUNTER — Ambulatory Visit (HOSPITAL_COMMUNITY): Payer: Medicare Other

## 2011-12-04 ENCOUNTER — Telehealth: Payer: Self-pay

## 2011-12-04 NOTE — Telephone Encounter (Signed)
1. Chest x-ray is without fluid build up or other abnormality 2. Order for echo is in - please check with PCCs or cardiology for actual date.

## 2011-12-04 NOTE — Telephone Encounter (Signed)
Informed caller of Xray results; already spoken w/P[facility] [Deb] on 03.27.13

## 2011-12-04 NOTE — Telephone Encounter (Signed)
Pt's daughter called requesting the results of Xray and status of referral for ECHO

## 2011-12-17 ENCOUNTER — Other Ambulatory Visit: Payer: Self-pay

## 2011-12-17 ENCOUNTER — Ambulatory Visit (HOSPITAL_COMMUNITY): Payer: Medicare Other | Attending: Cardiology

## 2011-12-17 DIAGNOSIS — E785 Hyperlipidemia, unspecified: Secondary | ICD-10-CM | POA: Insufficient documentation

## 2011-12-17 DIAGNOSIS — R0989 Other specified symptoms and signs involving the circulatory and respiratory systems: Secondary | ICD-10-CM | POA: Insufficient documentation

## 2011-12-17 DIAGNOSIS — Z87891 Personal history of nicotine dependence: Secondary | ICD-10-CM | POA: Insufficient documentation

## 2011-12-17 DIAGNOSIS — I359 Nonrheumatic aortic valve disorder, unspecified: Secondary | ICD-10-CM | POA: Insufficient documentation

## 2011-12-17 DIAGNOSIS — I2510 Atherosclerotic heart disease of native coronary artery without angina pectoris: Secondary | ICD-10-CM | POA: Insufficient documentation

## 2011-12-17 DIAGNOSIS — R011 Cardiac murmur, unspecified: Secondary | ICD-10-CM | POA: Insufficient documentation

## 2011-12-17 DIAGNOSIS — R0609 Other forms of dyspnea: Secondary | ICD-10-CM | POA: Insufficient documentation

## 2012-01-04 ENCOUNTER — Encounter: Payer: Self-pay | Admitting: Internal Medicine

## 2012-02-27 ENCOUNTER — Telehealth: Payer: Self-pay | Admitting: Cardiovascular Disease

## 2012-02-27 NOTE — Telephone Encounter (Signed)
New Problem:     I called the patient and was unable to reach them. I left a message on their voicemail with my name, the reason I called, the name of his physician, and a number to call back to schedule their appointment. 

## 2013-01-11 ENCOUNTER — Ambulatory Visit (INDEPENDENT_AMBULATORY_CARE_PROVIDER_SITE_OTHER): Payer: Medicare Other | Admitting: Family

## 2013-01-11 ENCOUNTER — Encounter: Payer: Self-pay | Admitting: Family

## 2013-01-11 ENCOUNTER — Ambulatory Visit (INDEPENDENT_AMBULATORY_CARE_PROVIDER_SITE_OTHER)
Admission: RE | Admit: 2013-01-11 | Discharge: 2013-01-11 | Disposition: A | Discharge status: Home / Self Care | Payer: Medicare Other | Source: Ambulatory Visit | Attending: Family | Admitting: Family

## 2013-01-11 VITALS — BP 146/84 | HR 63 | Wt 148.0 lb

## 2013-01-11 DIAGNOSIS — R350 Frequency of micturition: Secondary | ICD-10-CM

## 2013-01-11 DIAGNOSIS — M25551 Pain in right hip: Secondary | ICD-10-CM

## 2013-01-11 DIAGNOSIS — M545 Low back pain, unspecified: Secondary | ICD-10-CM

## 2013-01-11 DIAGNOSIS — M25559 Pain in unspecified hip: Secondary | ICD-10-CM

## 2013-01-11 DIAGNOSIS — Z125 Encounter for screening for malignant neoplasm of prostate: Secondary | ICD-10-CM

## 2013-01-11 LAB — POCT URINALYSIS DIPSTICK
Blood, UA: NEGATIVE
Nitrite, UA: NEGATIVE
Urobilinogen, UA: 0.2
pH, UA: 6.5

## 2013-01-11 NOTE — Patient Instructions (Addendum)
June Bufford Buttner- [phone]   Benign Prostatic Hyperplasia You have an enlarged prostate. This is common in elderly males. It is called BPH. This stands for benign prostate hyperplasia. The prostate gland is located in base of the bladder. When it grows, the prostate blocks the urethra. This is the tube which drains urine from the bladder.  SYMPTOMS  Weak urine stream.  Dribbling.  Feeling like the bladder has not emptied completely.  Difficulty starting urination.  Getting up frequently at night to urinate.  Urinating more frequently during the day. Complete urinary blockage or severe pain with urination requires immediate attention. DIAGNOSIS   Your caregiver often has a good idea what is wrong by taking a history and doing a physical exam.  Special x-rays may be done. TREATMENT   For mild problems, no treatment may be necessary.  If the problems are moderate, medications may provide relief. Some of these work by making the prostate gland smaller. The herb saw palmetto is commonly used.  If complete blockage occurs, a Foley catheter is usually left in place for a few days.  Surgery is often needed for more severe problems. TURP is the prostate surgery for BPH which is done through the urethra. TURP stands for transurethral resection of the prostate. It involves cutting away chips from the prostate. It is done by removing chips so that they can come out through the penis.  Techniques using heat, microwave and laser to remove the prostate blockage are also being used. HOME CARE INSTRUCTIONS   Give yourself time when you urinate.  Stay away from alcohol.  Beverages containing caffeine such as coffee, tea and colas can make the problems worse.  Decongestants, antihistamines, and some prescription medicines can also make the problem worse.  Follow up with your caregiver for further treatment as recommended. SEEK IMMEDIATE MEDICAL CARE IF:   You develop increased pain with  urination or are unable to pass your water.  You develop severe abdominal pain, vomiting, a high fever, or fainting.  You develop back pain or blood in your urine. MAKE SURE YOU:   Understand these instructions.  Will watch your condition.  Will get help right away if you are not doing well or get worse. Document Released: 08/25/2005 Document Revised: 11/17/2011 Document Reviewed: 04/30/2007 [facility] Patient Information 2013 [place], [place].  Osteoarthritis Osteoarthritis is the most common form of arthritis. It is redness, soreness, and swelling (inflammation) affecting the cartilage. Cartilage acts as a cushion, covering the ends of bones where they meet to form a joint. CAUSES  Over time, the cartilage begins to wear away. This causes bone to rub on bone. This produces pain and stiffness in the affected joints. Factors that contribute to this problem are:  Excessive body weight.  Age.  Overuse of joints. SYMPTOMS   People with osteoarthritis usually experience joint pain, swelling, or stiffness.  Over time, the joint may lose its normal shape.  Small deposits of bone (osteophytes) may grow on the edges of the joint.  Bits of bone or cartilage can break off and float inside the joint space. This may cause more pain and damage.  Osteoarthritis can lead to depression, anxiety, feelings of helplessness, and limitations on daily activities. The most commonly affected joints are in the:  Ends of the fingers.  Thumbs.  Neck.  Lower back.  Knees.  Hips. DIAGNOSIS  Diagnosis is mostly based on your symptoms and exam. Tests may be helpful, including:  X-rays of the affected joint.  A computerized  magnetic scan (MRI).  Blood tests to rule out other types of arthritis.  Joint fluid tests. This involves using a needle to draw fluid from the joint and examining the fluid under a microscope. TREATMENT  Goals of treatment are to control pain, improve joint function,  maintain a normal body weight, and maintain a healthy lifestyle. Treatment approaches may include:  A prescribed exercise program with rest and joint relief.  Weight control with nutritional education.  Pain relief techniques such as:  Properly applied heat and cold.  Electric pulses delivered to nerve endings under the skin (transcutaneous electrical nerve stimulation, TENS).  Massage.  Certain supplements. Ask your caregiver before using any supplements, especially in combination with prescribed drugs.  Medicines to control pain, such as:  Acetaminophen.  Nonsteroidal anti-inflammatory drugs (NSAIDs), such as naproxen.  Narcotic or central-acting agents, such as tramadol. This drug carries a risk of addiction and is generally prescribed for short-term use.  Corticosteroids. These can be given orally or as injection. This is a short-term treatment, not recommended for routine use.  Surgery to reposition the bones and relieve pain (osteotomy) or to remove loose pieces of bone and cartilage. Joint replacement may be needed in advanced states of osteoarthritis. HOME CARE INSTRUCTIONS  Your caregiver can recommend specific types of exercise. These may include:  Strengthening exercises. These are done to strengthen the muscles that support joints affected by arthritis. They can be performed with weights or with exercise bands to add resistance.  Aerobic activities. These are exercises, such as brisk walking or low-impact aerobics, that get your heart pumping. They can help keep your lungs and circulatory system in shape.  Range-of-motion activities. These keep your joints limber.  Balance and agility exercises. These help you maintain daily living skills. Learning about your condition and being actively involved in your care will help improve the course of your osteoarthritis. SEEK MEDICAL CARE IF:   You feel hot or your skin turns red.  You develop a rash in addition to your  joint pain.  You have an oral temperature above 102 F (38.9 C). FOR MORE INFORMATION  National Institute of Arthritis and Musculoskeletal and Skin Diseases: www.niams.http://www.myers.net/ [employer] on Aging: https://walker.com/ American College of Rheumatology: www.rheumatology.org Document Released: 08/25/2005 Document Revised: 11/17/2011 Document Reviewed: 12/06/2009 [facility] Patient Information 2013 [place], [place].

## 2013-01-12 ENCOUNTER — Other Ambulatory Visit: Payer: Self-pay | Admitting: Family

## 2013-01-12 ENCOUNTER — Telehealth: Payer: Self-pay | Admitting: *Deleted

## 2013-01-12 DIAGNOSIS — E7800 Pure hypercholesterolemia, unspecified: Secondary | ICD-10-CM

## 2013-01-12 DIAGNOSIS — M25561 Pain in right knee: Secondary | ICD-10-CM

## 2013-01-12 DIAGNOSIS — R972 Elevated prostate specific antigen [PSA]: Secondary | ICD-10-CM

## 2013-01-12 LAB — BASIC METABOLIC PANEL
BUN: 21 mg/dL (ref 6–23)
Calcium: 9.6 mg/dL (ref 8.4–10.5)
Chloride: 104 mEq/L (ref 96–112)
Creatinine, Ser: 1.6 mg/dL — ABNORMAL HIGH (ref 0.4–1.5)
GFR: 44.91 mL/min — ABNORMAL LOW (ref >60.00)

## 2013-01-12 LAB — PSA, MEDICARE: PSA: 4.12 ng/ml — ABNORMAL HIGH (ref 0.10–4.00)

## 2013-01-12 LAB — CBC WITH DIFFERENTIAL/PLATELET
Eosinophils Relative: 7.1 % — ABNORMAL HIGH (ref 0.0–5.0)
Lymphocytes Relative: 29 % (ref 12.0–46.0)
MCV: 87.1 fl (ref 78.0–100.0)
Monocytes Absolute: 0.8 10*3/uL (ref 0.1–1.0)
Neutrophils Relative %: 52.9 % (ref 43.0–77.0)
Platelets: 180 10*3/uL (ref 150.0–400.0)
WBC: 8.4 10*3/uL (ref 4.5–10.5)

## 2013-01-12 MED ORDER — TRAMADOL HCL 50 MG PO TABS: 50.0000 mg | ORAL_TABLET | Freq: Three times a day (TID) | ORAL | Status: DC | PRN

## 2013-01-12 NOTE — Telephone Encounter (Signed)
Pt's daughter called was seen yesterday at [facility] by Jolaine Artist at [place]. Referral was placed to see vascular surgeon. Pt's daughter had question as to why pt was referred to vascular surgeon. (per daughter, couldn't get through at [facility] office) Per referral and results of hip xray, pt was referred because of extreme calcification of femoral arteries bilaterally. Pt's daughter informed of this via VM and to contact [place] office and [place] if any more questions/concerns.

## 2013-01-12 NOTE — Progress Notes (Signed)
Subjective:    Patient ID: Maxwell Copeland, male    DOB: 1932/07/30, 79 y.o.   MRN: [id number]  HPI 79 year old white male, nonsmoker, patient of Dr. Debby Bud, is in today with complaints of right hip pain and right lower back pain x2 weeks. He rates the pain 8/10, worse with movement. He denies any known or acute injury. However, he does work and they are often and cons a IT trainer. The pain tends to be worse at night and with movement. Better with bending. He's been taken over-the-counter Tylenol that is not helping.   Review of Systems  Constitutional: Negative.   HENT: Negative.   Eyes: Negative.   Respiratory: Negative.   Cardiovascular: Negative.   Gastrointestinal: Negative.   Endocrine: Negative.   Genitourinary: Negative.   Musculoskeletal: Positive for back pain and joint swelling.       Right hip pain and back pain  Skin: Negative.   Allergic/Immunologic: Negative.   Neurological: Negative.   Hematological: Negative.   Psychiatric/Behavioral: Negative.    Past Medical History  Diagnosis Date  . Coronary artery disease     LAD w/ Ca++, 100% RCA, in-astent stenosis Cx, high grade lesion Ramus  . Hypercholesteremia   . Dyspnea   . Hypothyroid   . CVA (cerebral infarction)   . Arthritis     hips, will give out  . Varicella   . Depression     never took any medication.  . Emphysema of lung     smoking related.  . Hypertension   . Stroke     left hemiparesis-100% recovery    History   Social History  . Marital Status: Widowed    Spouse Name: N/A    Number of Children: N/A  . Years of Education: N/A   Occupational History  . retired    Social History Main Topics  . Smoking status: Former Smoker    Quit date: 09/09/1987  . Smokeless tobacco: Current User  . Alcohol Use: No  . Drug Use: No  . Sexually Active: Not Currently   Other Topics Concern  . Not on file   Social History Narrative   From [place]. Lived [place] since 1960's. Married 47 years -  widowed. 2 dtrs. Retired. Lives alone and independently but his dtr is close by.    No past surgical history on file.  Family History  Problem Relation Age of Onset  . Coronary artery disease    . Hyperlipidemia Mother   . Heart disease Mother     had artifical valve  . Heart disease Brother   . Hyperlipidemia Brother   . COPD Brother   . Heart disease Brother     CABG, valve replacement  . Alcohol abuse Brother     No Known Allergies  Current Outpatient Prescriptions on File Prior to Visit  Medication Sig Dispense Refill  . amLODipine (NORVASC) 10 MG tablet Take 10 mg by mouth daily.        Marland Kitchen aspirin 325 MG tablet Take 81 mg by mouth daily.       . clopidogrel (PLAVIX) 75 MG tablet Take 75 mg by mouth daily.        . isosorbide mononitrate (IMDUR) 30 MG 24 hr tablet Take 1 tablet (30 mg total) by mouth daily.  90 tablet  3  . levothyroxine (SYNTHROID, LEVOTHROID) 112 MCG tablet Take 112 mcg by mouth daily.        . metoprolol (LOPRESSOR) 50 MG tablet Take 50  mg by mouth daily.        . niacin (SLO-NIACIN) 500 MG tablet Take 500 mg by mouth at bedtime.        . NON FORMULARY COMPLEX B W/FOLIC VIT B-6 AND B-12 1 TAB PO QD       . ranitidine (ZANTAC) 150 MG tablet Take 150 mg by mouth daily.        . rosuvastatin (CRESTOR) 40 MG tablet Take 40 mg by mouth daily.        . vitamin E (VITAMIN E) 400 UNIT capsule Take 400 Units by mouth daily.        . nitroGLYCERIN (NITROSTAT) 0.4 MG SL tablet Place 1 tablet (0.4 mg total) under the tongue every 5 (five) minutes as needed for chest pain.  25 tablet  1   No current facility-administered medications on file prior to visit.    BP 146/84  Pulse 63  Wt 148 lb (67.132 kg)  BMI 22.51 kg/m2  SpO2 98%chart    Objective:   Physical Exam  Constitutional: He is oriented to person, place, and time. He appears well-developed and well-nourished.  HENT:  Right Ear: External ear normal.  Left Ear: External ear normal.  Nose: Nose  normal.  Mouth/Throat: Oropharynx is clear and moist.  Neck: Normal range of motion. Neck supple.  Cardiovascular: Normal rate, regular rhythm and normal heart sounds.   Pulmonary/Chest: Effort normal and breath sounds normal.  Abdominal: Soft. Bowel sounds are normal.  Musculoskeletal: Normal range of motion. He exhibits no edema and no tenderness.  Neurological: He is alert and oriented to person, place, and time.  Skin: Skin is warm and dry.  Psychiatric: He has a normal mood and affect.          Assessment & Plan:  Assessment:   1. Right hip pain 2. Low back pain  3. Osteoarthritis  Plan: Patient sent for x-ray of the right hip. I will notify patient pending results and discuss further treatment plan. Ice and heat to the affected area. Call the office if symptoms worsen or persist. Recheck as scheduled, and as needed.

## 2013-01-13 ENCOUNTER — Other Ambulatory Visit: Payer: Self-pay | Admitting: *Deleted

## 2013-01-13 DIAGNOSIS — I739 Peripheral vascular disease, unspecified: Secondary | ICD-10-CM

## 2013-01-14 ENCOUNTER — Ambulatory Visit (INDEPENDENT_AMBULATORY_CARE_PROVIDER_SITE_OTHER)
Admission: RE | Admit: 2013-01-14 | Discharge: 2013-01-14 | Disposition: A | Discharge status: Home / Self Care | Payer: Medicare Other | Source: Ambulatory Visit | Attending: Internal Medicine | Admitting: Internal Medicine

## 2013-01-14 ENCOUNTER — Ambulatory Visit (INDEPENDENT_AMBULATORY_CARE_PROVIDER_SITE_OTHER): Payer: Medicare Other | Admitting: Internal Medicine

## 2013-01-14 ENCOUNTER — Encounter: Payer: Self-pay | Admitting: Internal Medicine

## 2013-01-14 ENCOUNTER — Telehealth: Payer: Self-pay | Admitting: Family

## 2013-01-14 VITALS — BP 120/62 | HR 64 | Temp 97.7°F | Resp 16 | Wt 147.0 lb

## 2013-01-14 DIAGNOSIS — M545 Low back pain, unspecified: Secondary | ICD-10-CM | POA: Insufficient documentation

## 2013-01-14 DIAGNOSIS — I714 Abdominal aortic aneurysm, without rupture, unspecified: Secondary | ICD-10-CM | POA: Insufficient documentation

## 2013-01-14 DIAGNOSIS — M79604 Pain in right leg: Secondary | ICD-10-CM | POA: Insufficient documentation

## 2013-01-14 DIAGNOSIS — B029 Zoster without complications: Secondary | ICD-10-CM | POA: Insufficient documentation

## 2013-01-14 DIAGNOSIS — I2510 Atherosclerotic heart disease of native coronary artery without angina pectoris: Secondary | ICD-10-CM

## 2013-01-14 DIAGNOSIS — R972 Elevated prostate specific antigen [PSA]: Secondary | ICD-10-CM | POA: Insufficient documentation

## 2013-01-14 DIAGNOSIS — I739 Peripheral vascular disease, unspecified: Secondary | ICD-10-CM

## 2013-01-14 MED ORDER — PREDNISONE 10 MG PO TABS: ORAL_TABLET | ORAL | Status: DC

## 2013-01-14 MED ORDER — VITAMIN D 1000 UNITS PO TABS: 1000.0000 [IU] | ORAL_TABLET | Freq: Every day | ORAL | Status: AC

## 2013-01-14 MED ORDER — ACYCLOVIR 800 MG PO TABS: 800.0000 mg | ORAL_TABLET | Freq: Every day | ORAL | Status: DC

## 2013-01-14 NOTE — Assessment & Plan Note (Signed)
5/14 mild F/u w/Dr Norins Lab Results  Component Value Date   PSA 4.12* 01/11/2013

## 2013-01-14 NOTE — Assessment & Plan Note (Signed)
5/14 radiculopathy - likely H zoster Prednisone 10 mg: take 4 tabs a day x 3 days; then 3 tabs a day x 4 days; then 2 tabs a day x 4 days, then 1 tab a day x 6 days, then stop. Take pc. Acyclovir empiric Tramadol prn

## 2013-01-14 NOTE — Telephone Encounter (Signed)
Please call and cancel patient's Urology appointment. Advise patient that I spoke with Dr. Debby Bud and he does not feel it is necessary to refer patient to urology as noted below. Thank you!  From: Jacques Navy, MD Sent: 01/13/2013 5:22 PM To: Baker Pierini, [name] Thanks for the update: 1. PSA 4.12 in a 79 y/o is normal. Furthermore, current recommendations are to not screen for prostate cancer after age 14 [employer], April '13)) because lack of efficacy of treatment in regard to prolonging life. Please cancel urology consult. Maxwell Copeland ----- Message ----- From: Baker Pierini, [name] Sent: 01/12/2013 3:35 PM To: Jacques Navy, MD Dr. Debby Bud, FYI, patient was in to see me with right hip pain. I did an xray that showed significant calcification of the right femoral artery. I have referred to vascular. Additionally, he had c/o urinary frequency particularly at night. I did a PSA and it was mildly elevated. I have referred to urology because his baseline is unknown. Just keeping you in the loop! I wish you a speedy recovery! Adline Mango

## 2013-01-14 NOTE — Progress Notes (Signed)
  Subjective:    Patient ID: Maxwell Copeland, male    DOB: November 15, 1931, 79 y.o.   MRN: [id number]  Back Pain This is a new problem. The current episode started in the past 7 days. The problem occurs constantly. The problem is unchanged. The pain is present in the gluteal and lumbar spine. The quality of the pain is described as aching. The pain radiates to the right thigh. The pain is at a severity of 8/10. The pain is severe. The pain is the same all the time. Pertinent negatives include no abdominal pain, dysuria or weight loss. He has tried bed rest for the symptoms. The treatment provided mild relief.  Tramadol helped  C/o LBP and RLE pain for 1-2 wks - severe - He had elev PSA  Review of Systems  Constitutional: Negative for weight loss.  Gastrointestinal: Negative for abdominal pain.  Genitourinary: Negative for dysuria.  Musculoskeletal: Positive for back pain.       Objective:   Physical Exam  Constitutional: He is oriented to person, place, and time. He appears well-developed. No distress.  HENT:  Mouth/Throat: Oropharynx is clear and moist.  Eyes: Conjunctivae are normal. Pupils are equal, round, and reactive to light.  Neck: Normal range of motion. No JVD present. No thyromegaly present.  Cardiovascular: Normal rate, regular rhythm, normal heart sounds and intact distal pulses.  Exam reveals no gallop and no friction rub.   No murmur heard. Pulmonary/Chest: Effort normal and breath sounds normal. No respiratory distress. He has no wheezes. He has no rales. He exhibits no tenderness.  Abdominal: Soft. Bowel sounds are normal. He exhibits no distension and no mass. There is no tenderness. There is no rebound and no guarding.  Musculoskeletal: Normal range of motion. He exhibits tenderness (R thigh). He exhibits no edema.  cane  Lymphadenopathy:    He has no cervical adenopathy.  Neurological: He is alert and oriented to person, place, and time. He has normal reflexes. He  displays normal reflexes. No cranial nerve deficit. He exhibits normal muscle tone. Coordination normal.  str leg elev is neg B  Skin: Skin is warm and dry. Rash (healing blisers on R ant prox thigh) noted.  Psychiatric: He has a normal mood and affect. His behavior is normal. Judgment and thought content normal.    Lab Results  Component Value Date   WBC 8.4 01/11/2013   HGB 12.0* 01/11/2013   HCT 35.7* 01/11/2013   PLT 180.0 01/11/2013   GLUCOSE 99 01/11/2013   CHOL 108 05/08/2011   TRIG 34 05/08/2011   HDL 38* 05/08/2011   LDLCALC 63 05/08/2011   ALT 13 05/08/2011   AST 24 05/08/2011   NA 137 01/11/2013   K 4.7 01/11/2013   CL 104 01/11/2013   CREATININE 1.6* 01/11/2013   BUN 21 01/11/2013   CO2 27 01/11/2013   TSH 0.094* 05/08/2011   PSA 4.12* 01/11/2013   INR 1.32 05/08/2011         Assessment & Plan:

## 2013-01-14 NOTE — Assessment & Plan Note (Signed)
Diffuse PVD

## 2013-01-14 NOTE — Progress Notes (Signed)
  Subjective:    Patient ID: Maxwell Copeland, male    DOB: 04/14/1932, 79 y.o.   MRN: [id number]  HPI Patient also had complaints of urinary frequency particuarly at night x several months. Denies any burning with urination, blood in his urine, or foul smelling urine.     Review of Systems     Objective:   Physical Exam        Assessment & Plan:  Assessment: Urinary Frequency

## 2013-01-14 NOTE — Assessment & Plan Note (Signed)
R LE 5/14 - probable Empiric acyclovir

## 2013-01-14 NOTE — Assessment & Plan Note (Signed)
Continue with current prescription therapy as reflected on the Med list.  

## 2013-01-14 NOTE — Telephone Encounter (Signed)
Appointment for Urology canceled and message left to advise pt of cancellation per his PCP's order

## 2013-01-14 NOTE — Assessment & Plan Note (Signed)
abd US

## 2013-01-19 ENCOUNTER — Encounter: Payer: Self-pay | Admitting: Vascular Surgery

## 2013-01-20 ENCOUNTER — Encounter: Payer: Medicare Other | Admitting: Vascular Surgery

## 2013-01-20 ENCOUNTER — Telehealth: Payer: Self-pay | Admitting: *Deleted

## 2013-01-20 NOTE — Telephone Encounter (Signed)
Pt's daughter informed of MD's advisement. Pt's daughter states that he is still having issues with his leg. Transferred to scheduler to set up appointment.

## 2013-01-20 NOTE — Telephone Encounter (Signed)
OK to stop both meds today. OV if issues Thx

## 2013-01-20 NOTE — Telephone Encounter (Signed)
Pt's daughter states that pt was seen in OV last Friday and was prescribed Prednisone and Acyclovir. Pt wants to stop taking both medications because he states that he also has to take a Nitroglycerin when he takes both medications. Pt's daughter has concerns about him stopping the Prednisone abruptly and also about him taking Nitroglycerin-please advise.

## 2013-01-21 ENCOUNTER — Ambulatory Visit: Payer: Medicare Other | Admitting: Internal Medicine

## 2013-01-26 ENCOUNTER — Ambulatory Visit (INDEPENDENT_AMBULATORY_CARE_PROVIDER_SITE_OTHER): Payer: Medicare Other | Admitting: Internal Medicine

## 2013-01-26 ENCOUNTER — Encounter: Payer: Self-pay | Admitting: Internal Medicine

## 2013-01-26 VITALS — BP 120/62 | HR 59 | Temp 97.5°F | Wt 150.4 lb

## 2013-01-26 DIAGNOSIS — M79604 Pain in right leg: Secondary | ICD-10-CM

## 2013-01-26 DIAGNOSIS — M545 Low back pain, unspecified: Secondary | ICD-10-CM

## 2013-01-26 DIAGNOSIS — B029 Zoster without complications: Secondary | ICD-10-CM

## 2013-01-26 DIAGNOSIS — I714 Abdominal aortic aneurysm, without rupture: Secondary | ICD-10-CM

## 2013-01-26 MED ORDER — DICLOFENAC SODIUM 75 MG PO TBEC: 75.0000 mg | DELAYED_RELEASE_TABLET | Freq: Two times a day (BID) | ORAL | Status: DC

## 2013-01-26 NOTE — Progress Notes (Signed)
Subjective:    Patient ID: Maxwell Copeland, male    DOB: Mar 13, 1932, 79 y.o.   MRN: [id number]  HPI  Complains of continued right leg pain and low back pain See office visit with Dr. Roena Malady 5/9 related to same Symptoms have improved but not resolved following partial course of prednisone ( stopped because of palpitations) and acyclovir for associated herpetic zoster  Patient remains concerned that pain is related to possible kidney stone denies hematuria but increase in chronic difficulty urinating  Past Medical History  Diagnosis Date  . Coronary artery disease     LAD w/ Ca++, 100% RCA, in-astent stenosis Cx, high grade lesion Ramus  . Hypercholesteremia   . Dyspnea   . Hypothyroid   . CVA (cerebral infarction)   . Arthritis     hips, will give out  . Varicella   . Depression     never took any medication.  . Emphysema of lung     smoking related.  . Hypertension   . Stroke     left hemiparesis-100% recovery    Review of Systems  Cardiovascular: Negative for chest pain and leg swelling.  Musculoskeletal: Positive for back pain and gait problem (due to right leg pain, but improving since 2 weeks ago). Negative for joint swelling.       Objective:   Physical Exam BP 120/62  Pulse 59  Temp(Src) 97.5 F (36.4 C) (Oral)  Wt 150 lb 6.4 oz (68.221 kg)  BMI 22.87 kg/m2  SpO2 97% Wt Readings from Last 3 Encounters:  01/26/13 150 lb 6.4 oz (68.221 kg)  01/14/13 147 lb (66.679 kg)  01/11/13 148 lb (67.132 kg)   Constitutional: he appears well-developed and well-nourished. No distress. Daughter at side Cardiovascular: Normal rate, regular rhythm and normal heart sounds.  No murmur heard. No BLE edema. Pulmonary/Chest: Effort normal and breath sounds normal. No respiratory distress. he has no wheezes.  Musculoskeletal: Back: full range of motion of thoracic and lumbar spine. Non tender to palpation. Positive ipsilateral straight leg raise. DTR's are symmetrically intact.  Sensation intact in all dermatomes of the lower extremities. Full strength to manual muscle testing. patient is able to heel toe walk without difficulty, but ambulates with antalgic gait and cane assist. Skin: Skin is warm and dry. No rash noted. No erythema.  Psychiatric: he has a normal mood and affect. behavior is normal. Judgment and thought content normal.  Lab Results  Component Value Date   WBC 8.4 01/11/2013   HGB 12.0* 01/11/2013   HCT 35.7* 01/11/2013   PLT 180.0 01/11/2013   GLUCOSE 99 01/11/2013   CHOL 108 05/08/2011   TRIG 34 05/08/2011   HDL 38* 05/08/2011   LDLCALC 63 05/08/2011   ALT 13 05/08/2011   AST 24 05/08/2011   NA 137 01/11/2013   K 4.7 01/11/2013   CL 104 01/11/2013   CREATININE 1.6* 01/11/2013   BUN 21 01/11/2013   CO2 27 01/11/2013   TSH 0.094* 05/08/2011   PSA 4.12* 01/11/2013   INR 1.32 05/08/2011    Dg Lumbar Spine 2-3 Views  01/14/2013   *RADIOLOGY REPORT*  Clinical Data: Low back pain and right hip pain.  No known injury.  LUMBAR SPINE - 2-3 VIEW  Comparison: None.  Findings: Mild L3-4 and minimal L4-5 disc space narrowing.  Normal alignment.  Facet joint degenerative changes L3-4 through L5-S1 greatest on the right at the L5-S1 level.  Calcification right upper quadrant may represent calcified gallstone.  Vascular calcifications.  No bony destructive lesion.  IMPRESSION: Mild L3-4 and minimal L4-5 disc space narrowing.  Normal alignment.  Facet joint degenerative changes L3-4 through L5-S1 greatest on the right at the L5-S1 level.   Original Report Authenticated By: Lacy Duverney, M.D.     Assessment & Plan:   RLE radiculopathy, improved but not resolved -  intol of pred due to palpations Try diclofenac 75 bid x 1 week, then prn - erx done  HZV - to complete acyclovir as prescribed. No evidence of shingles rash or herpetic neuralgia today  Also follow up abd [place] as ordered to look for kidney stone or AAA as per AVP (see OV 5/9)

## 2013-01-26 NOTE — Patient Instructions (Signed)
It was good to see you today. We have reviewed your prior records including labs and tests today Complete the acyclovir as prescribed and use tramadol as previously prescribed for pain as needed Begin diclofenac twice daily with food for one week, then as needed for back and leg pain Your prescription(s) have been submitted to your pharmacy. Please take as directed and contact our office if you believe you are having problem(s) with the medication(s). Followup on abdominal ultrasound to look at aortic and kidneys as previously ordered -  Call if pain symptoms unimproved in next 4 weeks, sooner if worse  Lumbosacral Radiculopathy Lumbosacral radiculopathy is a pinched nerve or nerves in the low back (lumbosacral area). When this happens you may have weakness in your legs and may not be able to stand on your toes. You may have pain going down into your legs. There may be difficulties with walking normally. There are many causes of this problem. Sometimes this may happen from an injury, or simply from arthritis or boney problems. It may also be caused by other illnesses such as diabetes. If there is no improvement after treatment, further studies may be done to find the exact cause. DIAGNOSIS  X-rays may be needed if the problems become long standing. Electromyograms may be done. This study is one in which the working of nerves and muscles is studied. HOME CARE INSTRUCTIONS   Applications of ice packs may be helpful. Ice can be used in a plastic bag with a towel around it to prevent frostbite to skin. This may be used every 2 hours for 20 to 30 minutes, or as needed, while awake, or as directed by your caregiver.  Only take over-the-counter or prescription medicines for pain, discomfort, or fever as directed by your caregiver.  If physical therapy was prescribed, follow your caregiver's directions. SEEK IMMEDIATE MEDICAL CARE IF:   You have pain not controlled with medications.  You seem to be  getting worse rather than better.  You develop increasing weakness in your legs.  You develop loss of bowel or bladder control.  You have difficulty with walking or balance, or develop clumsiness in the use of your legs.  You have a fever. MAKE SURE YOU:   Understand these instructions.  Will watch your condition.  Will get help right away if you are not doing well or get worse. Document Released: 08/25/2005 Document Revised: 11/17/2011 Document Reviewed: 04/14/2008 [facility] Patient Information 2014 [place], [place].

## 2013-01-27 ENCOUNTER — Encounter: Payer: Self-pay | Admitting: Internal Medicine

## 2013-02-03 ENCOUNTER — Ambulatory Visit
Admission: RE | Admit: 2013-02-03 | Discharge: 2013-02-03 | Disposition: A | Discharge status: Home / Self Care | Payer: Medicare Other | Source: Ambulatory Visit | Attending: Internal Medicine | Admitting: Internal Medicine

## 2013-02-25 ENCOUNTER — Encounter: Payer: Medicare Other | Admitting: Vascular Surgery

## 2013-02-28 ENCOUNTER — Ambulatory Visit (INDEPENDENT_AMBULATORY_CARE_PROVIDER_SITE_OTHER): Payer: Medicare Other | Admitting: Internal Medicine

## 2013-02-28 ENCOUNTER — Encounter: Payer: Self-pay | Admitting: Internal Medicine

## 2013-02-28 VITALS — BP 114/70 | HR 61 | Temp 96.7°F | Resp 12 | Ht 68.0 in | Wt 147.0 lb

## 2013-02-28 DIAGNOSIS — M545 Low back pain, unspecified: Secondary | ICD-10-CM

## 2013-02-28 DIAGNOSIS — I714 Abdominal aortic aneurysm, without rupture: Secondary | ICD-10-CM

## 2013-02-28 DIAGNOSIS — M79604 Pain in right leg: Secondary | ICD-10-CM

## 2013-02-28 DIAGNOSIS — B029 Zoster without complications: Secondary | ICD-10-CM

## 2013-02-28 NOTE — Assessment & Plan Note (Signed)
No rash noted. No persistent pain or discomfort. Problem resolved.

## 2013-02-28 NOTE — Assessment & Plan Note (Signed)
No evidence of AAA.

## 2013-02-28 NOTE — Progress Notes (Signed)
Subjective:    Patient ID: Maxwell Copeland, male    DOB: 1932-01-13, 79 y.o.   MRN: [id number]  HPI Mr. Ransom returns for follow up of leg pain right and for results of Abdominal U/S. He was seen May 9th and May 21 for tick bites vs Shingles as well as possible radicular pain. He had a lumbar spine series that revealed mild DDD lumbar without severe disease or formainal encroachment.  In the interval his leg pain has resolved; he completed acyclovir and his tick bites/shingles has resolved.  He had abdominal U/S may 29th: 12 mm gallstone - symptomatic without gallbladder wall thickening, no kidney stone and an ectatic AA w/o aneurysm.  At this time he is feeling well.  PMH, FamHx and SocHx reviewed for any changes and relevance.  Current Outpatient Prescriptions on File Prior to Visit  Medication Sig Dispense Refill  . acyclovir (ZOVIRAX) 800 MG tablet Take 1 tablet (800 mg total) by mouth 5 (five) times daily.  35 tablet  0  . amLODipine (NORVASC) 10 MG tablet Take 10 mg by mouth daily.        Marland Kitchen aspirin 325 MG tablet Take 81 mg by mouth daily.       . cholecalciferol (VITAMIN D) 1000 UNITS tablet Take 1 tablet (1,000 Units total) by mouth daily.  100 tablet  3  . clopidogrel (PLAVIX) 75 MG tablet Take 75 mg by mouth daily.        . diclofenac (VOLTAREN) 75 MG EC tablet Take 1 tablet (75 mg total) by mouth 2 (two) times daily with a meal. X 1 week, then as needed for back/leg pain  40 tablet  0  . isosorbide mononitrate (IMDUR) 30 MG 24 hr tablet Take 1 tablet (30 mg total) by mouth daily.  90 tablet  3  . levothyroxine (SYNTHROID, LEVOTHROID) 112 MCG tablet Take 112 mcg by mouth daily.        . metoprolol (LOPRESSOR) 50 MG tablet Take 50 mg by mouth daily.        . niacin (SLO-NIACIN) 500 MG tablet Take 500 mg by mouth at bedtime.        . NON FORMULARY COMPLEX B W/FOLIC VIT B-6 AND B-12 1 TAB PO QD       . ranitidine (ZANTAC) 150 MG tablet Take 150 mg by mouth daily.        .  rosuvastatin (CRESTOR) 40 MG tablet Take 40 mg by mouth daily.        . traMADol (ULTRAM) 50 MG tablet Take 1 tablet (50 mg total) by mouth every 8 (eight) hours as needed for pain.  60 tablet  0  . nitroGLYCERIN (NITROSTAT) 0.4 MG SL tablet Place 1 tablet (0.4 mg total) under the tongue every 5 (five) minutes as needed for chest pain.  25 tablet  1   No current facility-administered medications on file prior to visit.      Review of Systems System review is negative for any constitutional, cardiac, pulmonary, GI or neuro symptoms or complaints other than as described in the HPI.     Objective:   Physical Exam Filed Vitals:   02/28/13 1458  BP: 114/70  Pulse: 61  Temp: 96.7 F (35.9 C)  Resp: 12   Gen'l- elderly slender white man in no distress HEENT- C&S clear Cor- 2+ radial pulse, RRR Pulm - normal respirations, lungs CTAP Derm - clear Neuro - A&O x 3, CN II-XII normal, normal gait.  Assessment & Plan:

## 2013-02-28 NOTE — Assessment & Plan Note (Signed)
Symptoms resolved. No limitation in activity.

## 2013-03-04 ENCOUNTER — Encounter: Payer: Medicare Other | Admitting: Vascular Surgery

## 2013-04-13 ENCOUNTER — Other Ambulatory Visit: Payer: Self-pay

## 2013-07-14 ENCOUNTER — Other Ambulatory Visit: Payer: Self-pay

## 2014-01-22 ENCOUNTER — Encounter: Payer: Self-pay | Admitting: Family Medicine

## 2014-01-22 ENCOUNTER — Ambulatory Visit (INDEPENDENT_AMBULATORY_CARE_PROVIDER_SITE_OTHER): Payer: Medicare Other | Admitting: Family Medicine

## 2014-01-22 ENCOUNTER — Ambulatory Visit: Payer: Medicare Other

## 2014-01-22 VITALS — BP 116/60 | HR 64 | Temp 97.7°F | Resp 18 | Ht 66.0 in | Wt 147.0 lb

## 2014-01-22 DIAGNOSIS — J189 Pneumonia, unspecified organism: Secondary | ICD-10-CM

## 2014-01-22 DIAGNOSIS — J209 Acute bronchitis, unspecified: Secondary | ICD-10-CM

## 2014-01-22 DIAGNOSIS — R059 Cough, unspecified: Secondary | ICD-10-CM

## 2014-01-22 DIAGNOSIS — R062 Wheezing: Secondary | ICD-10-CM

## 2014-01-22 DIAGNOSIS — R05 Cough: Secondary | ICD-10-CM

## 2014-01-22 DIAGNOSIS — J029 Acute pharyngitis, unspecified: Secondary | ICD-10-CM

## 2014-01-22 MED ORDER — HYDROCODONE-HOMATROPINE 5-1.5 MG/5ML PO SYRP: 5.0000 mL | ORAL_SOLUTION | Freq: Three times a day (TID) | ORAL | Status: DC | PRN

## 2014-01-22 MED ORDER — METHYLPREDNISOLONE (PAK) 4 MG PO TABS: ORAL_TABLET | ORAL | Status: DC

## 2014-01-22 MED ORDER — ALBUTEROL SULFATE (2.5 MG/3ML) 0.083% IN NEBU
2.5000 mg | INHALATION_SOLUTION | Freq: Once | RESPIRATORY_TRACT | Status: AC
  Administered 2014-01-22: 2.5 mg via RESPIRATORY_TRACT

## 2014-01-22 MED ORDER — AZITHROMYCIN 250 MG PO TABS: ORAL_TABLET | ORAL | Status: DC

## 2014-01-22 NOTE — Progress Notes (Signed)
Patient ID: Maxwell ShipperRichard C Copeland MRN: [id number], DOB: 02/16/1932, 80 y.o. Date of Encounter: 01/22/2014, 6:04 PM  Primary Physician: Illene RegulusMichael Norins, MD  Chief Complaint:  Chief Complaint  Patient presents with  . Cough    x1 week mucous yellow   . Sore Throat    HPI: 80 y.o. year old male presents with a 7 day history of nasal congestion, post nasal drip, sore throat, and cough. Mild sinus pressure. Afebrile. No chills. Nasal congestion thick and green/yellow. Cough is productive of green/yellow sputum and not associated with time of day. Ears feel full, leading to sensation of muffled hearing. Has tried OTC cold preps without success. No GI complaints.   No sick contacts, recent antibiotics, or recent travels.   Quit smoking 20+ years ago.  No h/o asthma but told he might have emphysema in the past.  No leg trauma, sedentary periods, h/o cancer, or tobacco use.  Past Medical History  Diagnosis Date  . Coronary artery disease     LAD w/ Ca++, 100% RCA, in-astent stenosis Cx, high grade lesion Ramus  . Hypercholesteremia   . Dyspnea   . Hypothyroid   . CVA (cerebral infarction)   . Arthritis     hips, will give out  . Varicella   . Depression     never took any medication.  . Emphysema of lung     smoking related.  . Hypertension   . Stroke     left hemiparesis-100% recovery     Home Meds: Prior to Admission medications   Medication Sig Start Date End Date Taking? Authorizing Provider  amLODipine (NORVASC) 10 MG tablet Take 10 mg by mouth daily.     Yes Historical Provider, MD  aspirin 325 MG tablet Take 81 mg by mouth daily.    Yes Historical Provider, MD  isosorbide mononitrate (IMDUR) 30 MG 24 hr tablet Take 1 tablet (30 mg total) by mouth daily. 06/06/11  Yes Pricilla RifflePaula V Ross, MD  levothyroxine (SYNTHROID, LEVOTHROID) 112 MCG tablet Take 112 mcg by mouth daily.     Yes Historical Provider, MD  ranitidine (ZANTAC) 150 MG tablet Take 150 mg by mouth daily.     Yes  Historical Provider, MD  rosuvastatin (CRESTOR) 40 MG tablet Take 40 mg by mouth daily.     Yes Historical Provider, MD  vitamin E 100 UNIT capsule Take by mouth daily.   Yes Historical Provider, MD  acyclovir (ZOVIRAX) 800 MG tablet Take 1 tablet (800 mg total) by mouth 5 (five) times daily. 01/14/13   Georgina QuintAleksei V Plotnikov, MD  clopidogrel (PLAVIX) 75 MG tablet Take 75 mg by mouth daily.      Historical Provider, MD  diclofenac (VOLTAREN) 75 MG EC tablet Take 1 tablet (75 mg total) by mouth 2 (two) times daily with a meal. X 1 week, then as needed for back/leg pain 01/26/13   Newt LukesValerie A Leschber, MD  metoprolol (LOPRESSOR) 50 MG tablet Take 50 mg by mouth daily.      Historical Provider, MD  niacin (SLO-NIACIN) 500 MG tablet Take 500 mg by mouth at bedtime.      Historical Provider, MD  nitroGLYCERIN (NITROSTAT) 0.4 MG SL tablet Place 1 tablet (0.4 mg total) under the tongue every 5 (five) minutes as needed for chest pain. 12/03/10 12/03/11  Wendall StadePeter C Nishan, MD  NON FORMULARY COMPLEX B W/FOLIC VIT B-6 AND B-12 1 TAB PO QD     Historical Provider, MD  traMADol (ULTRAM) 50 MG  tablet Take 1 tablet (50 mg total) by mouth every 8 (eight) hours as needed for pain. 01/12/13   Baker PieriniPadonda B Zinn, [name]    Allergies: No Known Allergies  History   Social History  . Marital Status: Widowed    Spouse Name: N/A    Number of Children: N/A  . Years of Education: N/A   Occupational History  . retired    Social History Main Topics  . Smoking status: Former Smoker    Quit date: 09/09/1987  . Smokeless tobacco: Current User  . Alcohol Use: No  . Drug Use: No  . Sexual Activity: Not Currently   Other Topics Concern  . Not on file   Social History Narrative   From [place] Lived [place] since 1960's. Married 47 years - widowed. 2 dtrs. Retired. Lives alone and independently but his dtr is close by.     Review of Systems: Constitutional: negative for chills, fever, night sweats or weight  changes Cardiovascular: negative for chest pain or palpitations Respiratory: negative for hemoptysis, wheezing, or shortness of breath Abdominal: negative for abdominal pain, nausea, vomiting or diarrhea Dermatological: negative for rash Neurologic: negative for headache   Physical Exam: Blood pressure 116/60, pulse 64, temperature 97.7 F (36.5 C), temperature source Oral, resp. rate 18, height 5\' 6"  (1.676 m), weight 147 lb (66.679 kg), SpO2 94.00%., Body mass index is 23.74 kg/(m^2). General: Well developed, well nourished, in no acute distress. Head: Normocephalic, atraumatic, eyes without discharge, sclera non-icteric, nares are congested.   Edentulous. Bilateral auditory canals clear, TM's are without perforation, pearly grey with reflective cone of light bilaterally. No sinus TTP. Oral cavity moist, dentition normal. Posterior pharynx with post nasal drip and mild erythema. No peritonsillar abscess or tonsillar exudate. Neck: Supple. No thyromegaly. Full ROM. No lymphadenopathy. Lungs: Coarse breath sounds bilaterally with wheezes, rales, or rhonchi. Breathing is and mildly labored.  Heart: RRR with S1 S2. Deep 2/6 systolic murmur c/w aortic stenosis at right upper sternal border with radiation to right carotid Extremities: No clubbing or cyanosis. No edema. Neuro: Alert and oriented X 3. Moves all extremities spontaneously. CNII-XII grossly in tact. Psych:  Responds to questions appropriately with a normal affect.   [facility] reading (PRIMARY) by  Dr. Milus GlazierLauenstein:  CXR:  Increased aeration.  Flattened diaphragms.  Streaky LLL densities  ASSESSMENT AND PLAN:  80 y.o. year old male with bronchitis. -Cough - Plan: DG Chest 2 View, albuterol (PROVENTIL) (2.5 MG/3ML) 0.083% nebulizer solution 2.5 mg, methylPREDNIsolone (MEDROL DOSPACK) 4 MG tablet, azithromycin (ZITHROMAX Z-PAK) 250 MG tablet, HYDROcodone-homatropine (HYCODAN) 5-1.5 MG/5ML syrup  Wheezing - Plan: DG Chest 2 View, albuterol  (PROVENTIL) (2.5 MG/3ML) 0.083% nebulizer solution 2.5 mg, methylPREDNIsolone (MEDROL DOSPACK) 4 MG tablet, azithromycin (ZITHROMAX Z-PAK) 250 MG tablet  Acute bronchitis - Plan: methylPREDNIsolone (MEDROL DOSPACK) 4 MG tablet, azithromycin (ZITHROMAX Z-PAK) 250 MG tablet   -Mucinex -Tylenol/Motrin prn -Rest/fluids -RTC precautions -RTC 3-5 days if no improvement  Signed, Elvina SidleKurt Robertha Staples, MD 01/22/2014 6:04 PM Patient improved aeration with decreased wheezes after breathing RX Patient agrees to RTC INB in 48 hours.

## 2014-02-01 ENCOUNTER — Telehealth: Payer: Self-pay

## 2014-02-01 ENCOUNTER — Other Ambulatory Visit: Payer: Self-pay | Admitting: Internal Medicine

## 2014-02-01 NOTE — Telephone Encounter (Signed)
Spoke to pt he is feeling much better. He did have a residual cough he might come in tomorrow evening to see Dr L to follow up.

## 2014-02-01 NOTE — Telephone Encounter (Signed)
Patients daughter called. States she and her father had an ordeal when picking up the rx's that we prescribed. Somehow the prednisone was never picked up/filled at the pharmacy so her Dad never took that medication. June states that her father is still not feeling any better and needs to know what she should do next. Please return call. Thank you.

## 2014-02-07 ENCOUNTER — Encounter: Payer: Self-pay | Admitting: Internal Medicine

## 2014-02-07 ENCOUNTER — Ambulatory Visit (INDEPENDENT_AMBULATORY_CARE_PROVIDER_SITE_OTHER): Payer: Medicare Other | Admitting: Internal Medicine

## 2014-02-07 VITALS — BP 130/60 | HR 64 | Temp 97.7°F | Ht 68.0 in | Wt 145.0 lb

## 2014-02-07 DIAGNOSIS — J441 Chronic obstructive pulmonary disease with (acute) exacerbation: Secondary | ICD-10-CM

## 2014-02-07 DIAGNOSIS — J209 Acute bronchitis, unspecified: Secondary | ICD-10-CM

## 2014-02-07 MED ORDER — LEVOFLOXACIN 500 MG PO TABS: 500.0000 mg | ORAL_TABLET | Freq: Every day | ORAL | Status: DC

## 2014-02-07 MED ORDER — BUDESONIDE-FORMOTEROL FUMARATE 160-4.5 MCG/ACT IN AERO: 2.0000 | INHALATION_SPRAY | Freq: Two times a day (BID) | RESPIRATORY_TRACT | Status: DC

## 2014-02-07 NOTE — Progress Notes (Signed)
   Subjective:    Patient ID: Maxwell Copeland, male    DOB: [birth year], 80 y.o.   MRN: [id number]  HPI  His symptoms began approximately 3 weeks ago as a sore throat, rhinitis, chest congestion, and cough. He also had itchy, watery eyes, and sneezing. He describes sweats without fever or chills.  The cough was productive of some white sputum.  Triggers may include pollen.  Tylenol and NyQuil were of marginal benefit  He was seen at the urgent care 5/17 and prescribed Z-Pak. A chest x-ray done at that visit was reviewed personally. This revealed COPD with no infiltrate or effusion. Apparently steroids were called in to the wrong pharmacy. These were not started until 5/26. After taking this for 2 days this was discontinued because he felt it was causing chest pain.  He continues to have a cough with thick white secretions as well as wheezing. He describes shortness of breath.   He quit smoking in 1989.  He does not have any bronchodilator therapy at this time.     Review of Systems  At this time he denies frontal headache, facial pain, otic pain, or otic discharge.     Objective:   Physical Exam  He appears chronically ill; he is unshaven.  No scleral icterus or conjunctivitis is present  Septal dislocation to the left is noted. The nares are boggy.  He's completely edentulous  Tympanic membranes are dull & scarred.  He exhibits coarse rhonchi in the lower lung fields bilaterally  There is a grade 1 systolic murmur at the right base  He has no clubbing, cyanosis, or edema          Assessment & Plan:  #1 COPD exacerbation  #2 subjective intolerance to prednisone  See orders and recommendations.

## 2014-02-07 NOTE — Patient Instructions (Signed)
Symbicort  two inhalations every 12 hours; gargle and spit after use

## 2014-02-07 NOTE — Progress Notes (Signed)
Pre visit review using our clinic review tool, if applicable. No additional management support is needed unless otherwise documented below in the visit note. 

## 2014-03-18 ENCOUNTER — Emergency Department (HOSPITAL_COMMUNITY): Payer: Medicare Other

## 2014-03-18 ENCOUNTER — Inpatient Hospital Stay (HOSPITAL_COMMUNITY)
Admission: EM | Admit: 2014-03-18 | Discharge: 2014-03-21 | DRG: 281 | Disposition: A | Discharge status: Home / Self Care | Payer: Medicare Other | Attending: Internal Medicine | Admitting: Internal Medicine

## 2014-03-18 ENCOUNTER — Encounter (HOSPITAL_COMMUNITY): Payer: Self-pay | Admitting: Emergency Medicine

## 2014-03-18 DIAGNOSIS — I2510 Atherosclerotic heart disease of native coronary artery without angina pectoris: Secondary | ICD-10-CM | POA: Diagnosis present

## 2014-03-18 DIAGNOSIS — I10 Essential (primary) hypertension: Secondary | ICD-10-CM

## 2014-03-18 DIAGNOSIS — I359 Nonrheumatic aortic valve disorder, unspecified: Secondary | ICD-10-CM | POA: Diagnosis present

## 2014-03-18 DIAGNOSIS — I739 Peripheral vascular disease, unspecified: Secondary | ICD-10-CM

## 2014-03-18 DIAGNOSIS — I6350 Cerebral infarction due to unspecified occlusion or stenosis of unspecified cerebral artery: Secondary | ICD-10-CM

## 2014-03-18 DIAGNOSIS — I714 Abdominal aortic aneurysm, without rupture, unspecified: Secondary | ICD-10-CM | POA: Diagnosis present

## 2014-03-18 DIAGNOSIS — I249 Acute ischemic heart disease, unspecified: Secondary | ICD-10-CM

## 2014-03-18 DIAGNOSIS — Z7902 Long term (current) use of antithrombotics/antiplatelets: Secondary | ICD-10-CM | POA: Diagnosis not present

## 2014-03-18 DIAGNOSIS — J438 Other emphysema: Secondary | ICD-10-CM | POA: Diagnosis present

## 2014-03-18 DIAGNOSIS — E7800 Pure hypercholesterolemia, unspecified: Secondary | ICD-10-CM | POA: Diagnosis present

## 2014-03-18 DIAGNOSIS — M129 Arthropathy, unspecified: Secondary | ICD-10-CM | POA: Diagnosis present

## 2014-03-18 DIAGNOSIS — E039 Hypothyroidism, unspecified: Secondary | ICD-10-CM | POA: Diagnosis present

## 2014-03-18 DIAGNOSIS — IMO0002 Reserved for concepts with insufficient information to code with codable children: Secondary | ICD-10-CM | POA: Diagnosis not present

## 2014-03-18 DIAGNOSIS — I200 Unstable angina: Secondary | ICD-10-CM

## 2014-03-18 DIAGNOSIS — Z87891 Personal history of nicotine dependence: Secondary | ICD-10-CM

## 2014-03-18 DIAGNOSIS — Z8249 Family history of ischemic heart disease and other diseases of the circulatory system: Secondary | ICD-10-CM | POA: Diagnosis not present

## 2014-03-18 DIAGNOSIS — I252 Old myocardial infarction: Secondary | ICD-10-CM | POA: Diagnosis not present

## 2014-03-18 DIAGNOSIS — I498 Other specified cardiac arrhythmias: Secondary | ICD-10-CM | POA: Diagnosis present

## 2014-03-18 DIAGNOSIS — I214 Non-ST elevation (NSTEMI) myocardial infarction: Secondary | ICD-10-CM | POA: Diagnosis not present

## 2014-03-18 DIAGNOSIS — R519 Headache, unspecified: Secondary | ICD-10-CM

## 2014-03-18 DIAGNOSIS — R51 Headache: Secondary | ICD-10-CM

## 2014-03-18 DIAGNOSIS — Z8673 Personal history of transient ischemic attack (TIA), and cerebral infarction without residual deficits: Secondary | ICD-10-CM

## 2014-03-18 DIAGNOSIS — J690 Pneumonitis due to inhalation of food and vomit: Secondary | ICD-10-CM

## 2014-03-18 DIAGNOSIS — R079 Chest pain, unspecified: Secondary | ICD-10-CM | POA: Diagnosis present

## 2014-03-18 DIAGNOSIS — Z7982 Long term (current) use of aspirin: Secondary | ICD-10-CM

## 2014-03-18 DIAGNOSIS — R0602 Shortness of breath: Secondary | ICD-10-CM

## 2014-03-18 DIAGNOSIS — I350 Nonrheumatic aortic (valve) stenosis: Secondary | ICD-10-CM | POA: Diagnosis present

## 2014-03-18 DIAGNOSIS — Z7189 Other specified counseling: Secondary | ICD-10-CM

## 2014-03-18 DIAGNOSIS — D61818 Other pancytopenia: Secondary | ICD-10-CM | POA: Diagnosis present

## 2014-03-18 DIAGNOSIS — J189 Pneumonia, unspecified organism: Secondary | ICD-10-CM | POA: Diagnosis present

## 2014-03-18 DIAGNOSIS — E440 Moderate protein-calorie malnutrition: Secondary | ICD-10-CM | POA: Diagnosis present

## 2014-03-18 DIAGNOSIS — Z9861 Coronary angioplasty status: Secondary | ICD-10-CM | POA: Diagnosis not present

## 2014-03-18 HISTORY — DX: Nonrheumatic aortic (valve) stenosis: I35.0

## 2014-03-18 HISTORY — DX: Abdominal aortic aneurysm, without rupture, unspecified: I71.40

## 2014-03-18 HISTORY — DX: Abdominal aortic aneurysm, without rupture: I71.4

## 2014-03-18 LAB — COMPREHENSIVE METABOLIC PANEL
ALT: 21 U/L (ref 0–53)
ANION GAP: 15 (ref 5–15)
AST: 31 U/L (ref 0–37)
Albumin: 3.8 g/dL (ref 3.5–5.2)
Alkaline Phosphatase: 84 U/L (ref 39–117)
BILIRUBIN TOTAL: 0.5 mg/dL (ref 0.3–1.2)
BUN: 18 mg/dL (ref 6–23)
CHLORIDE: 103 meq/L (ref 96–112)
CO2: 24 mEq/L (ref 19–32)
Calcium: 9 mg/dL (ref 8.4–10.5)
Creatinine, Ser: 1.1 mg/dL (ref 0.50–1.35)
GFR calc Af Amer: 72 mL/min — ABNORMAL LOW (ref >90)
GFR calc non Af Amer: 62 mL/min — ABNORMAL LOW (ref >90)
GLUCOSE: 102 mg/dL — AB (ref 70–99)
POTASSIUM: 3.8 meq/L (ref 3.7–5.3)
SODIUM: 142 meq/L (ref 137–147)
TOTAL PROTEIN: 7.1 g/dL (ref 6.0–8.3)

## 2014-03-18 LAB — PROTIME-INR
INR: 1.23 (ref 0.00–1.49)
Prothrombin Time: 15.5 seconds — ABNORMAL HIGH (ref 11.6–15.2)

## 2014-03-18 LAB — URINALYSIS, ROUTINE W REFLEX MICROSCOPIC
Bilirubin Urine: NEGATIVE
Glucose, UA: NEGATIVE mg/dL
HGB URINE DIPSTICK: NEGATIVE
KETONES UR: NEGATIVE mg/dL
Leukocytes, UA: NEGATIVE
NITRITE: NEGATIVE
PROTEIN: NEGATIVE mg/dL
SPECIFIC GRAVITY, URINE: 1.029 (ref 1.005–1.030)
Urobilinogen, UA: 1 mg/dL (ref 0.0–1.0)
pH: 7.5 (ref 5.0–8.0)

## 2014-03-18 LAB — I-STAT CHEM 8, ED
BUN: 18 mg/dL (ref 6–23)
CREATININE: 1.2 mg/dL (ref 0.50–1.35)
Calcium, Ion: 1.16 mmol/L (ref 1.13–1.30)
Chloride: 105 mEq/L (ref 96–112)
Glucose, Bld: 101 mg/dL — ABNORMAL HIGH (ref 70–99)
HCT: 34 % — ABNORMAL LOW (ref 39.0–52.0)
Hemoglobin: 11.6 g/dL — ABNORMAL LOW (ref 13.0–17.0)
Potassium: 3.7 mEq/L (ref 3.7–5.3)
SODIUM: 141 meq/L (ref 137–147)
TCO2: 24 mmol/L (ref 0–100)

## 2014-03-18 LAB — TROPONIN I
Troponin I: 1.49 ng/mL (ref <0.30)
Troponin I: 4.84 ng/mL (ref <0.30)
Troponin I: 6.24 ng/mL (ref <0.30)

## 2014-03-18 LAB — LIPASE, BLOOD: Lipase: 37 U/L (ref 11–59)

## 2014-03-18 LAB — CBC WITH DIFFERENTIAL/PLATELET
Basophils Absolute: 0 10*3/uL (ref 0.0–0.1)
Basophils Relative: 0 % (ref 0–1)
EOS ABS: 0.2 10*3/uL (ref 0.0–0.7)
EOS PCT: 3 % (ref 0–5)
HEMATOCRIT: 32.9 % — AB (ref 39.0–52.0)
HEMOGLOBIN: 10.9 g/dL — AB (ref 13.0–17.0)
Lymphocytes Relative: 8 % — ABNORMAL LOW (ref 12–46)
Lymphs Abs: 0.4 10*3/uL — ABNORMAL LOW (ref 0.7–4.0)
MCH: 29.4 pg (ref 26.0–34.0)
MCHC: 33.1 g/dL (ref 30.0–36.0)
MCV: 88.7 fL (ref 78.0–100.0)
MONO ABS: 0.5 10*3/uL (ref 0.1–1.0)
MONOS PCT: 10 % (ref 3–12)
NEUTROS ABS: 4 10*3/uL (ref 1.7–7.7)
Neutrophils Relative %: 79 % — ABNORMAL HIGH (ref 43–77)
Platelets: 141 10*3/uL — ABNORMAL LOW (ref 150–400)
RBC: 3.71 MIL/uL — ABNORMAL LOW (ref 4.22–5.81)
RDW: 13.4 % (ref 11.5–15.5)
WBC: 5.1 10*3/uL (ref 4.0–10.5)

## 2014-03-18 LAB — I-STAT TROPONIN, ED
Troponin i, poc: 0.13 ng/mL (ref 0.00–0.08)
Troponin i, poc: 0.29 ng/mL (ref 0.00–0.08)

## 2014-03-18 LAB — HEPARIN LEVEL (UNFRACTIONATED)
HEPARIN UNFRACTIONATED: 0.36 [IU]/mL (ref 0.30–0.70)
Heparin Unfractionated: 0.26 IU/mL — ABNORMAL LOW (ref 0.30–0.70)
Heparin Unfractionated: 0.35 IU/mL (ref 0.30–0.70)

## 2014-03-18 LAB — CK TOTAL AND CKMB (NOT AT ARMC)
CK TOTAL: 186 U/L (ref 7–232)
CK, MB: 13.7 ng/mL — AB (ref 0.3–4.0)
RELATIVE INDEX: 7.4 — AB (ref 0.0–2.5)

## 2014-03-18 LAB — I-STAT CG4 LACTIC ACID, ED: LACTIC ACID, VENOUS: 0.58 mmol/L (ref 0.5–2.2)

## 2014-03-18 LAB — MRSA PCR SCREENING: MRSA by PCR: NEGATIVE

## 2014-03-18 LAB — TSH: TSH: 0.256 u[IU]/mL — ABNORMAL LOW (ref 0.350–4.500)

## 2014-03-18 LAB — PRO B NATRIURETIC PEPTIDE: Pro B Natriuretic peptide (BNP): 1374 pg/mL — ABNORMAL HIGH (ref 0–450)

## 2014-03-18 LAB — PLATELET INHIBITION P2Y12: Platelet Function  P2Y12: 187 [PRU] — ABNORMAL LOW (ref 194–418)

## 2014-03-18 LAB — CBG MONITORING, ED: Glucose-Capillary: 96 mg/dL (ref 70–99)

## 2014-03-18 MED ORDER — ONDANSETRON HCL 4 MG/2ML IJ SOLN: 4.0000 mg | Freq: Four times a day (QID) | INTRAMUSCULAR | Status: DC | PRN

## 2014-03-18 MED ORDER — VITAMIN E 45 MG (100 UNIT) PO CAPS
100.0000 [IU] | ORAL_CAPSULE | Freq: Every morning | ORAL | Status: DC
  Administered 2014-03-19 – 2014-03-21 (×3): 100 [IU] via ORAL
  Filled 2014-03-18 (×3): qty 1

## 2014-03-18 MED ORDER — ASPIRIN EC 325 MG PO TBEC
325.0000 mg | DELAYED_RELEASE_TABLET | Freq: Every morning | ORAL | Status: DC
  Administered 2014-03-18 – 2014-03-20 (×3): 325 mg via ORAL
  Filled 2014-03-18 (×4): qty 1

## 2014-03-18 MED ORDER — SODIUM CHLORIDE 0.9 % IV SOLN: 250.0000 mL | INTRAVENOUS | Status: DC | PRN

## 2014-03-18 MED ORDER — GUAIFENESIN 100 MG/5ML PO SOLN
5.0000 mL | Freq: Four times a day (QID) | ORAL | Status: DC
  Administered 2014-03-18 – 2014-03-21 (×10): 100 mg via ORAL
  Filled 2014-03-18 (×14): qty 5

## 2014-03-18 MED ORDER — ZOLPIDEM TARTRATE 5 MG PO TABS: 5.0000 mg | ORAL_TABLET | Freq: Every evening | ORAL | Status: DC | PRN

## 2014-03-18 MED ORDER — CLINDAMYCIN PHOSPHATE 600 MG/50ML IV SOLN
600.0000 mg | Freq: Once | INTRAVENOUS | Status: AC
  Administered 2014-03-18: 600 mg via INTRAVENOUS
  Filled 2014-03-18: qty 50

## 2014-03-18 MED ORDER — BUDESONIDE-FORMOTEROL FUMARATE 160-4.5 MCG/ACT IN AERO
2.0000 | INHALATION_SPRAY | Freq: Two times a day (BID) | RESPIRATORY_TRACT | Status: DC
  Administered 2014-03-18 – 2014-03-21 (×6): 2 via RESPIRATORY_TRACT
  Filled 2014-03-18: qty 6

## 2014-03-18 MED ORDER — ALPRAZOLAM 0.25 MG PO TABS: 0.2500 mg | ORAL_TABLET | Freq: Two times a day (BID) | ORAL | Status: DC | PRN

## 2014-03-18 MED ORDER — HEPARIN (PORCINE) IN NACL 100-0.45 UNIT/ML-% IJ SOLN
800.0000 [IU]/h | INTRAMUSCULAR | Status: DC
  Administered 2014-03-18 – 2014-03-19 (×2): 800 [IU]/h via INTRAVENOUS
  Filled 2014-03-18 (×3): qty 250

## 2014-03-18 MED ORDER — ATORVASTATIN CALCIUM 80 MG PO TABS
80.0000 mg | ORAL_TABLET | Freq: Every morning | ORAL | Status: DC
  Administered 2014-03-18 – 2014-03-21 (×4): 80 mg via ORAL
  Filled 2014-03-18 (×4): qty 1

## 2014-03-18 MED ORDER — NITROGLYCERIN 0.4 MG SL SUBL: 0.4000 mg | SUBLINGUAL_TABLET | SUBLINGUAL | Status: DC | PRN

## 2014-03-18 MED ORDER — SODIUM CHLORIDE 0.9 % IN NEBU
3.0000 mL | INHALATION_SOLUTION | Freq: Three times a day (TID) | RESPIRATORY_TRACT | Status: DC
  Filled 2014-03-18 (×2): qty 3

## 2014-03-18 MED ORDER — IOHEXOL 350 MG/ML SOLN
70.0000 mL | Freq: Once | INTRAVENOUS | Status: AC | PRN
  Administered 2014-03-18: 70 mL via INTRAVENOUS

## 2014-03-18 MED ORDER — LEVOTHYROXINE SODIUM 112 MCG PO TABS
112.0000 ug | ORAL_TABLET | Freq: Every day | ORAL | Status: DC
  Administered 2014-03-19 – 2014-03-21 (×3): 112 ug via ORAL
  Filled 2014-03-18 (×4): qty 1

## 2014-03-18 MED ORDER — DEXTROSE 5 % IV SOLN
1.0000 g | Freq: Once | INTRAVENOUS | Status: AC
  Administered 2014-03-18: 1 g via INTRAVENOUS
  Filled 2014-03-18: qty 10

## 2014-03-18 MED ORDER — SODIUM CHLORIDE 0.9 % IJ SOLN
3.0000 mL | Freq: Two times a day (BID) | INTRAMUSCULAR | Status: DC
  Administered 2014-03-20 – 2014-03-21 (×3): 3 mL via INTRAVENOUS

## 2014-03-18 MED ORDER — FAMOTIDINE 20 MG PO TABS
20.0000 mg | ORAL_TABLET | Freq: Every day | ORAL | Status: DC
  Administered 2014-03-18 – 2014-03-21 (×4): 20 mg via ORAL
  Filled 2014-03-18 (×4): qty 1

## 2014-03-18 MED ORDER — AMLODIPINE BESYLATE 10 MG PO TABS
10.0000 mg | ORAL_TABLET | Freq: Every morning | ORAL | Status: DC
  Administered 2014-03-18 – 2014-03-21 (×4): 10 mg via ORAL
  Filled 2014-03-18 (×4): qty 1

## 2014-03-18 MED ORDER — SODIUM CHLORIDE 0.9 % IJ SOLN: 3.0000 mL | INTRAMUSCULAR | Status: DC | PRN

## 2014-03-18 MED ORDER — HEPARIN (PORCINE) IN NACL 100-0.45 UNIT/ML-% IJ SOLN
800.0000 [IU]/h | Freq: Once | INTRAMUSCULAR | Status: AC
  Administered 2014-03-18: 800 [IU]/h via INTRAVENOUS
  Filled 2014-03-18: qty 250

## 2014-03-18 MED ORDER — ACETAMINOPHEN 325 MG PO TABS
650.0000 mg | ORAL_TABLET | ORAL | Status: DC | PRN
  Administered 2014-03-18 (×2): 650 mg via ORAL
  Filled 2014-03-18 (×2): qty 2

## 2014-03-18 MED ORDER — HEPARIN BOLUS VIA INFUSION
4000.0000 [IU] | Freq: Once | INTRAVENOUS | Status: AC
  Administered 2014-03-18: 4000 [IU] via INTRAVENOUS
  Filled 2014-03-18: qty 4000

## 2014-03-18 MED ORDER — NITROGLYCERIN 2 % TD OINT
1.0000 [in_us] | TOPICAL_OINTMENT | Freq: Once | TRANSDERMAL | Status: AC
  Administered 2014-03-18: 1 [in_us] via TOPICAL
  Filled 2014-03-18: qty 1

## 2014-03-18 MED ORDER — METOPROLOL TARTRATE 12.5 MG HALF TABLET
37.5000 mg | ORAL_TABLET | Freq: Two times a day (BID) | ORAL | Status: DC
  Administered 2014-03-18 – 2014-03-20 (×4): 37.5 mg via ORAL
  Filled 2014-03-18 (×5): qty 1

## 2014-03-18 MED ORDER — SODIUM CHLORIDE 0.9 % IJ SOLN
INTRAMUSCULAR | Status: AC
  Filled 2014-03-18: qty 150

## 2014-03-18 MED ORDER — ISOSORBIDE MONONITRATE ER 30 MG PO TB24
30.0000 mg | ORAL_TABLET | Freq: Every morning | ORAL | Status: DC
  Administered 2014-03-19 – 2014-03-21 (×3): 30 mg via ORAL
  Filled 2014-03-18 (×3): qty 1

## 2014-03-18 MED ORDER — CLOPIDOGREL BISULFATE 75 MG PO TABS
75.0000 mg | ORAL_TABLET | Freq: Every morning | ORAL | Status: DC
  Administered 2014-03-18 – 2014-03-21 (×4): 75 mg via ORAL
  Filled 2014-03-18 (×4): qty 1

## 2014-03-18 MED ORDER — MORPHINE SULFATE 4 MG/ML IJ SOLN
4.0000 mg | INTRAMUSCULAR | Status: AC
  Administered 2014-03-18: 4 mg via INTRAVENOUS
  Filled 2014-03-18: qty 1

## 2014-03-18 MED ORDER — NIACIN ER 500 MG PO TBCR
500.0000 mg | EXTENDED_RELEASE_TABLET | Freq: Every morning | ORAL | Status: DC
  Administered 2014-03-19 – 2014-03-21 (×3): 500 mg via ORAL
  Filled 2014-03-18 (×3): qty 1

## 2014-03-18 NOTE — ED Notes (Signed)
Patient taken to CT by CT tech  Patient is complaining of head pain and throat pain on arrival to CT

## 2014-03-18 NOTE — ED Provider Notes (Signed)
CSN: [id number]     Arrival date & time 03/18/14  0458 History   First MD Initiated Contact with Patient 03/18/14 0503     Chief Complaint  Patient presents with  . Chest Pain     (Consider location/radiation/quality/duration/timing/severity/associated sxs/prior Treatment) HPI  This patient is a very pleasant elderly man who presents with complaints of chest pain. He is unable to describe his chest pain. However, he says it as moderately severe. It radiates into the neck and is actually most severe in the upper chest. Is worse when the patient is standing up or laying supine. The patient feels most comfortable when he is in an upright and slightly recumbent position. He has had a productive cough. He was found to be febrile with a temp of 101.5 Fahrenheit in the emergency department.  He denies feeling short of breath. He has not had abdominal pain. Patient also has a diffuse, aching headache. No recent head trauma.   Patient denies history of similar sx.    Past Medical History  Diagnosis Date  . Coronary artery disease     LAD w/ Ca++, 100% RCA, in-astent stenosis Cx, high grade lesion Ramus  . Hypercholesteremia   . Dyspnea   . Hypothyroid   . CVA (cerebral infarction)   . Arthritis     hips, will give out  . Varicella   . Depression     never took any medication.  . Emphysema of lung     smoking related.  . Hypertension   . Stroke     left hemiparesis-100% recovery   History reviewed. No pertinent past surgical history. Family History  Problem Relation Age of Onset  . Coronary artery disease    . Hyperlipidemia Mother   . Heart disease Mother     had artifical valve  . Heart disease Brother   . Hyperlipidemia Brother   . COPD Brother   . Heart disease Brother     CABG, valve replacement  . Alcohol abuse Brother    History  Substance Use Topics  . Smoking status: Former Smoker    Quit date: 09/09/1987  . Smokeless tobacco: Current User  . Alcohol Use: No     Review of Systems  Ten point review of symptoms performed and is negative with the exception of symptoms noted above.   Allergies  Prednisone  Home Medications   Prior to Admission medications   Medication Sig Start Date End Date Taking? Authorizing Provider  acyclovir (ZOVIRAX) 800 MG tablet Take 1 tablet (800 mg total) by mouth 5 (five) times daily. 01/14/13   Aleksei Plotnikov V, MD  amLODipine (NORVASC) 10 MG tablet Take 10 mg by mouth daily.      Historical Provider, MD  aspirin 325 MG tablet Take 81 mg by mouth daily.     Historical Provider, MD  B Complex Vitamins (B COMPLEX PO) Take by mouth.    Historical Provider, MD  budesonide-formoterol (SYMBICORT) 160-4.5 MCG/ACT inhaler Inhale 2 puffs into the lungs 2 (two) times daily. 02/07/14   Pecola Lawless, MD  clopidogrel (PLAVIX) 75 MG tablet Take 75 mg by mouth daily.      Historical Provider, MD  Cyanocobalamin (VITAMIN B 12 PO) Take by mouth.    Historical Provider, MD  diclofenac (VOLTAREN) 75 MG EC tablet Take 1 tablet (75 mg total) by mouth 2 (two) times daily with a meal. X 1 week, then as needed for back/leg pain 01/26/13   Newt Lukes, MD  HYDROcodone-homatropine (HYCODAN) 5-1.5 MG/5ML syrup Take 5 mLs by mouth every 8 (eight) hours as needed for cough. 01/22/14   Elvina Sidle, MD  isosorbide mononitrate (IMDUR) 30 MG 24 hr tablet Take 1 tablet (30 mg total) by mouth daily. 06/06/11   Pricilla Riffle, MD  levofloxacin (LEVAQUIN) 500 MG tablet Take 1 tablet (500 mg total) by mouth daily. 02/07/14   Pecola Lawless, MD  levothyroxine (SYNTHROID, LEVOTHROID) 112 MCG tablet Take 112 mcg by mouth daily.      Historical Provider, MD  metoprolol (LOPRESSOR) 50 MG tablet Take 50 mg by mouth daily.      Historical Provider, MD  niacin (SLO-NIACIN) 500 MG tablet Take 500 mg by mouth at bedtime.      Historical Provider, MD  nitroGLYCERIN (NITROSTAT) 0.4 MG SL tablet Place 1 tablet (0.4 mg total) under the tongue every 5 (five)  minutes as needed for chest pain. 12/03/10 02/07/14  Wendall Stade, MD  Pyridoxine HCl (VITAMIN B-6 PO) Take by mouth.    Historical Provider, MD  ranitidine (ZANTAC) 150 MG tablet Take 150 mg by mouth daily.      Historical Provider, MD  rosuvastatin (CRESTOR) 40 MG tablet Take 40 mg by mouth daily.      Historical Provider, MD  traMADol (ULTRAM) 50 MG tablet Take 1 tablet (50 mg total) by mouth every 8 (eight) hours as needed for pain. 01/12/13   Baker Pierini, [name]  vitamin E 100 UNIT capsule Take by mouth daily.    Historical Provider, MD   BP 140/70  Pulse 99  Temp(Src) 101.5 F (38.6 C) (Rectal)  Resp 22  SpO2 96% Physical Exam Gen: well developed and well nourished appearing, mildly uncomfortable appearing Head: NCAT Eyes: PERL, EOMI Nose: no epistaixis or rhinorrhea Mouth/throat: mucosa is moist and pink Neck: supple, no stridor Lungs: Respiratory rate 24 per minute CTA B, no wheezing, rhonchi or rales CV: regular rate and rythm, good distal pulses.  Abd: soft, notender, nondistended Back: no ttp, no cva ttp Skin: warm and dry Ext: no edema, normal to inspection Neuro: CN ii-xii grossly intact, no focal deficits Psyche; mildly anxious affect,  calm and cooperative.  ED Course  Procedures (including critical care time) Labs Review  Results for orders placed during the hospital encounter of 03/18/14 (from the past 24 hour(s))  COMPREHENSIVE METABOLIC PANEL     Status: Abnormal   Collection Time    03/18/14  5:34 AM      Result Value Ref Range   Sodium 142  137 - 147 mEq/L   Potassium 3.8  3.7 - 5.3 mEq/L   Chloride 103  96 - 112 mEq/L   CO2 24  19 - 32 mEq/L   Glucose, Bld 102 (*) 70 - 99 mg/dL   BUN 18  6 - 23 mg/dL   Creatinine, Ser 1.61  0.50 - 1.35 mg/dL   Calcium 9.0  8.4 - 09.6 mg/dL   Total Protein 7.1  6.0 - 8.3 g/dL   Albumin 3.8  3.5 - 5.2 g/dL   AST 31  0 - 37 U/L   ALT 21  0 - 53 U/L   Alkaline Phosphatase 84  39 - 117 U/L   Total Bilirubin 0.5   0.3 - 1.2 mg/dL   GFR calc non Af Amer 62 (*) >90 mL/min   GFR calc Af Amer 72 (*) >90 mL/min   Anion gap 15  5 - 15  CBC WITH DIFFERENTIAL  Status: Abnormal   Collection Time    03/18/14  5:34 AM      Result Value Ref Range   WBC 5.1  4.0 - 10.5 K/uL   RBC 3.71 (*) 4.22 - 5.81 MIL/uL   Hemoglobin 10.9 (*) 13.0 - 17.0 g/dL   HCT 69.6 (*) 29.5 - 28.4 %   MCV 88.7  78.0 - 100.0 fL   MCH 29.4  26.0 - 34.0 pg   MCHC 33.1  30.0 - 36.0 g/dL   RDW 13.2  44.0 - 10.2 %   Platelets 141 (*) 150 - 400 K/uL   Neutrophils Relative % 79 (*) 43 - 77 %   Neutro Abs 4.0  1.7 - 7.7 K/uL   Lymphocytes Relative 8 (*) 12 - 46 %   Lymphs Abs 0.4 (*) 0.7 - 4.0 K/uL   Monocytes Relative 10  3 - 12 %   Monocytes Absolute 0.5  0.1 - 1.0 K/uL   Eosinophils Relative 3  0 - 5 %   Eosinophils Absolute 0.2  0.0 - 0.7 K/uL   Basophils Relative 0  0 - 1 %   Basophils Absolute 0.0  0.0 - 0.1 K/uL  LIPASE, BLOOD     Status: None   Collection Time    03/18/14  5:34 AM      Result Value Ref Range   Lipase 37  11 - 59 U/L  I-STAT TROPOININ, ED     Status: Abnormal   Collection Time    03/18/14  5:38 AM      Result Value Ref Range   Troponin i, poc 0.13 (*) 0.00 - 0.08 ng/mL   Comment NOTIFIED PHYSICIAN     Comment 3           I-STAT CHEM 8, ED     Status: Abnormal   Collection Time    03/18/14  5:40 AM      Result Value Ref Range   Sodium 141  137 - 147 mEq/L   Potassium 3.7  3.7 - 5.3 mEq/L   Chloride 105  96 - 112 mEq/L   BUN 18  6 - 23 mg/dL   Creatinine, Ser 7.25  0.50 - 1.35 mg/dL   Glucose, Bld 366 (*) 70 - 99 mg/dL   Calcium, Ion 4.40  3.47 - 1.30 mmol/L   TCO2 24  0 - 100 mmol/L   Hemoglobin 11.6 (*) 13.0 - 17.0 g/dL   HCT 42.5 (*) 95.6 - 38.7 %  I-STAT CG4 LACTIC ACID, ED     Status: None   Collection Time    03/18/14  5:41 AM      Result Value Ref Range   Lactic Acid, Venous 0.58  0.5 - 2.2 mmol/L  CBG MONITORING, ED     Status: None   Collection Time    03/18/14  6:31 AM       Result Value Ref Range   Glucose-Capillary 96  70 - 99 mg/dL   EKG: nsr, ST depression present in the lateral leads which is not noticed on prior, normal intervals, normal axis, normal qrs complex      CT Angio Chest PE W/Cm &/Or Wo Cm (Final result)  Result time: 03/18/14 06:32:55    Final result by Rad Results In Interface (03/18/14 06:32:55)    Narrative:   CLINICAL DATA: Chest pain, epigastric pain, sinus drainage.  EXAM: CT ANGIOGRAPHY CHEST WITH CONTRAST  TECHNIQUE: Multidetector CT imaging of the chest was performed using the standard  protocol during bolus administration of intravenous contrast. Multiplanar CT image reconstructions and MIPs were obtained to evaluate the vascular anatomy.  CONTRAST: 70mL OMNIPAQUE IOHEXOL 350 MG/ML SOLN  COMPARISON: Chest radiograph Jan 16, 2014  FINDINGS: Adequate pulmonary arterial opacification. Main pulmonary artery is not enlarged. No pulmonary arterial filling defects to the level the sub segmental branches.  Heart size is normal, mild pericardial thickening versus effusion. Coronary artery stents suspected. Thoracic aorta is normal in course and caliber with moderate to severe calcific atherosclerosis. No lymphadenopathy by CT size criteria.  Moderate centrilobular emphysema, trace left pleural effusion. Lingular atelectasis versus scarring. Dependent atelectasis with minimal tree-in-bud infiltrate in right greater than left lung bases. Nonocclusive soft tissue within the distal trachea, extending of the right mainstem bronchus suggesting aspiration with mild bronchial wall thickening. No pneumothorax.  Ectatic infrarenal aorta to 2.9 cm with intimal hematoma, calcific atherosclerosis. Osteopenia. No acute osseous process.  Review of the MIP images confirms the above findings.  IMPRESSION: No acute pulmonary embolism.  Nonocclusive debris within the distal trachea extending to the right mainstem bronchus suggesting  aspirate.  Mild bronchial wall thickening/bronchitis. Emphysema with minimal bibasilar tree-in-bud infiltrate which may be infectious or inflammatory. Trace left pleural effusion.   Electronically Signed By: Awilda Metroourtnay Bloomer On: 03/18/2014 06:32             DG Chest Port 1 View (Final result)  Result time: 03/18/14 05:44:53    [facility]       Final result by Rad Results In Interface (03/18/14 05:44:53)    Narrative:   CLINICAL DATA: Chest pain.  EXAM: PORTABLE CHEST - 1 VIEW  COMPARISON: Chest radiograph Jan 22, 2014  FINDINGS: Cardiac silhouette is unremarkable and unchanged. Moderately calcified aortic knob. Similar mild chronic interstitial changes, increased lung volumes. Strandy densities in the lung bases. No pneumothorax.  Soft tissue planes and included osseous structures are nonsuspicious, mild degenerative change of the thoracic spine.  IMPRESSION: COPD with bibasilar strandy densities likely reflecting atelectasis.   Electronically Signed By: Awilda Metroourtnay Bloomer On: 03/18/2014 05:44    CRITICAL CARE Performed by: Brandt LoosenManly, Cassady Turano   Total critical care time: 6636m  Critical care time was exclusive of separately billable procedures and treating other patients.  Critical care was necessary to treat or prevent imminent or life-threatening deterioration.  Critical care was time spent personally by me on the following activities: development of treatment plan with patient and/or surrogate as well as nursing, discussions with consultants, evaluation of patient's response to treatment, examination of patient, obtaining history from patient or surrogate, ordering and performing treatments and interventions, ordering and review of laboratory studies, ordering and review of radiographic studies, pulse oximetry and re-evaluation of patient's condition.   MDM   Patient has dynamic EKG changes and a mildly elevated troponin. He has  received ASA and NTG and is currently pain free. His only complaint is headache. He coughed quite a bit after CT scan - hopefully expectorating the debris which was noted int he trachea and right main stem. He appears to have aspiration pneumonia and we are covering broadly with Clindamycin and Ceftriaxone. Blood cultures ordered.   Case discussed with Cardiologist on call. He agrees with ED management and plan to initiate heparin therapy. It is change of shift and daytime Cardiologist will see the patient in the ED and likely admit.     Brandt LoosenJulie Emila Steinhauser, MD 03/18/14 [phone]

## 2014-03-18 NOTE — Progress Notes (Signed)
ANTICOAGULATION CONSULT NOTE - Initial Consult  Pharmacy Consult for Heparin Indication: chest pain/ACS  Allergies  Allergen Reactions  . Prednisone     See 02/07/14 he feels this caused chest pain    Patient Measurements: Height: 5' 8.11" (173 cm) Weight: 145 lb 8.1 oz (66 kg) IBW/kg (Calculated) : 68.65  Vital Signs: Temp: 101.5 F (38.6 C) (07/11 0504) Temp src: Rectal (07/11 0504) BP: 129/57 mmHg (07/11 0707) Pulse Rate: 88 (07/11 0707)  Labs:  Recent Labs  03/18/14 0534 03/18/14 0540  HGB 10.9* 11.6*  HCT 32.9* 34.0*  PLT 141*  --   CREATININE 1.10 1.20    Estimated Creatinine Clearance: 46.6 ml/min (by C-G formula based on Cr of 1.2).   Medical History: Past Medical History  Diagnosis Date  . Coronary artery disease     LAD w/ Ca++, 100% RCA, in-astent stenosis Cx, high grade lesion Ramus  . Hypercholesteremia   . Dyspnea   . Hypothyroid   . CVA (cerebral infarction)   . Arthritis     hips, will give out  . Varicella   . Depression     never took any medication.  . Emphysema of lung     smoking related.  . Hypertension   . Stroke     left hemiparesis-100% recovery    Medications:  Acyclovir  Norvasc  ASA  Vit B  Symbicort  PLavix  Voltaren  Imdur  Levaquin  Synthroid  Lopressor  Niacin  Ntg  Zantac  Crestor  Ultram  Assessment: 80 y.o. male with chest pain for heparin   Goal of Therapy:  Heparin level 0.3-0.7 units/ml Monitor platelets by anticoagulation protocol: Yes   Plan:  Heparin 4000 units IV bolus, then start heparin 800 unit/hr as per MD orders Check heparin level in 8 hours.  Eddie Candlebbott, Johnavon Mcclafferty [place] 03/18/2014,7:20 AM

## 2014-03-18 NOTE — ED Notes (Signed)
Pt from home. Started having upper abdominal/epigastric pain yesterday at 7pm. EMS called to home, but pt refused to come to the ER. EMS called again this am and pt agreed to come to the ER. Pt now denies CP/epigastric pain. Complaining of headache and throat pain. States that he has had sinus drainage for a few days. Pt alert and oriented x 4, neuro intact.

## 2014-03-18 NOTE — Progress Notes (Signed)
ANTICOAGULATION CONSULT NOTE - Follow Up Consult  Pharmacy Consult for heparin Indication: NSTEMI  Allergies  Allergen Reactions  . Prednisone Palpitations    See 02/07/14 he feels this caused chest pain    Patient Measurements: Height: 5\' 8"  (172.7 cm) Weight: 142 lb 6.7 oz (64.6 kg) IBW/kg (Calculated) : 68.4  Vital Signs: Temp: 98.8 F (37.1 C) (07/11 1429) Temp src: Oral (07/11 1429) BP: 160/97 mmHg (07/11 1600) Pulse Rate: 80 (07/11 1600)  Labs:  Recent Labs  03/18/14 0534 03/18/14 0540 03/18/14 0913 03/18/14 1442 03/18/14 1443 03/18/14 1540  HGB 10.9* 11.6*  --   --   --   --   HCT 32.9* 34.0*  --   --   --   --   PLT 141*  --   --   --   --   --   LABPROT  --   --   --   --   --  15.5*  INR  --   --   --   --   --  1.23  HEPARINUNFRC  --   --   --   --  0.26* 0.36  CREATININE 1.10 1.20  --   --   --   --   CKTOTAL  --   --  186  --   --   --   CKMB  --   --  13.7*  --   --   --   TROPONINI  --   --  1.49* 4.84*  --   --     Estimated Creatinine Clearance: 45.6 ml/min (by C-G formula based on Cr of 1.2).   Medications:  . heparin      Assessment: 80 y/o male who presented to the ED on 7/11 with abdominal and epigastric pain found to have elevated troponin and ruled in for NSTEMI. He continues on IV heparin. Heparin level is therapeutic at 0.36 on 800 units/hr.   Goal of Therapy:  Heparin level 0.3-0.7 units/ml Monitor platelets by anticoagulation protocol: Yes   Plan:  - Continue IV heparin at 800 units/hr - 6 hr confirmatory heparin level - Daily heparin level and CBC  [facility]erd[facility] HospitalJennifer West Fair[place], VillasPharm.D., BCPS Clinical Pharmacist Pager: [phone] 03/18/2014 5:39 PM

## 2014-03-18 NOTE — Consult Note (Signed)
CARDIOLOGY HISTORY AND PHYSICAL   Patient ID: Maxwell ShipperRichard C Copeland MRN: [id number] DOB/AGE: 78/03/1932 [age] y.o.  Admit date: 03/18/2014  Primary Physician   Illene RegulusMichael Norins, MD Primary Cardiologist   PN  Reason for Consultation   Chest pain  MVH:QIONGEXHPI:Arcenio C Orvan FalconerCampbell is a 80 y.o. male with a history of CAD. He had a NSTEMI in 2012 with a peak troponin of 1.4. The cath report from that admission is below, he was treated medically.  Last week, he ate hamburgers, not necessarily chewing very well. His appetite has been poor recently, not eating very well. No known fevers. He has not been coughing more than usual. Had a URI in late May, completing antibiotics. Over the last few days, he has been eating poorly and says he has no appetite.  3 days ago, he developed chest pain. It was in the center of his chest and went up into his neck. This pain was made worse by tilting his head back. When he tilts his head back he will get pain in the back of his neck that radiates through to his chest. He describes the chest pain as a burning. He states it was severe but has trouble putting a number to it. He tried aspirin, sublingual nitroglycerin, and Gas-X. He did not feel any of these medications made a significant difference. The pain would wax and wane, but did not resolve. He does not feel that coughing or exertion made a difference. There was no association with position, deep inspiration or meals. The only thing that made it worse was tilted his head back. By description, he was probably not sleeping very well either. EMS was called but the patient initially refused transport.  He ate a minimal amount of soup yesterday. Today, he just seemed a little worse and was brought into the emergency room by EMS. In the emergency room he was febrile with temperature 101.5, and a CT of his chest is abnormal with possible infiltrate and possible foreign material, concerning for aspirated food. He has been started on  antibiotics by the emergency room physician. He can not say when his chest pain resolved, but says it was down to a moderate amount upon arrival and is now 0.   Past Medical History  Diagnosis Date  . Coronary artery disease     LAD w/ Ca++, 100% RCA, in-astent stenosis Cx, high grade lesion Ramus  . Hypercholesteremia   . Dyspnea   . Hypothyroid   . CVA (cerebral infarction)   . Arthritis     hips, will give out  . Varicella   . Depression     never took any medication.  . Emphysema of lung     smoking related.  . Hypertension   . Stroke     left hemiparesis-100% recovery  . AAA (abdominal aortic aneurysm)     small  . Severe aortic stenosis 2012     Past Surgical History  Procedure Laterality Date  . Coronary angioplasty with stent placement      Several stents, last in 2004    Allergies  Allergen Reactions  . Prednisone Palpitations    See 02/07/14 he feels this caused chest pain   I have reviewed the patient's current medications . sodium chloride        Medication Sig  acyclovir (ZOVIRAX) 800 MG tablet Take 1 tablet (800 mg total) by mouth 5 (five) times daily.  amLODipine (NORVASC) 10 MG tablet Take 10 mg by  mouth daily.    aspirin 325 MG tablet Take 81 mg by mouth daily.   B Complex Vitamins (B COMPLEX PO) Take by mouth.  budesonide-formoterol (SYMBICORT) 160-4.5 MCG/ACT inhaler Inhale 2 puffs into the lungs 2 (two) times daily.  clopidogrel (PLAVIX) 75 MG tablet Take 75 mg by mouth daily.    Cyanocobalamin (VITAMIN B 12 PO) Take by mouth.  diclofenac (VOLTAREN) 75 MG EC tablet Take 1 tablet (75 mg total) by mouth 2 (two) times daily with a meal. X 1 week, then as needed for back/leg pain  HYDROcodone-homatropine (HYCODAN) 5-1.5 MG/5ML syrup Take 5 mLs by mouth every 8 (eight) hours as needed for cough.  isosorbide mononitrate (IMDUR) 30 MG 24 hr tablet Take 1 tablet (30 mg total) by mouth daily.  levofloxacin (LEVAQUIN) 500 MG tablet Take 1 tablet (500 mg  total) by mouth daily.  levothyroxine (SYNTHROID, LEVOTHROID) 112 MCG tablet Take 112 mcg by mouth daily.    metoprolol (LOPRESSOR) 50 MG tablet Take 50 mg by mouth daily.    niacin (SLO-NIACIN) 500 MG tablet Take 500 mg by mouth at bedtime.    nitroGLYCERIN (NITROSTAT) 0.4 MG SL tablet Place 1 tablet (0.4 mg total) under the tongue every 5 (five) minutes as needed for chest pain.  Pyridoxine HCl (VITAMIN B-6 PO) Take by mouth.  ranitidine (ZANTAC) 150 MG tablet Take 150 mg by mouth daily.    rosuvastatin (CRESTOR) 40 MG tablet Take 40 mg by mouth daily.    traMADol (ULTRAM) 50 MG tablet Take 1 tablet (50 mg total) by mouth every 8 (eight) hours as needed for pain.  vitamin E 100 UNIT capsule Take by mouth daily.     History   Social History  . Marital Status: Widowed    Spouse Name: N/A    Number of Children: N/A  . Years of Education: N/A   Occupational History  . retired    Social History Main Topics  . Smoking status: Former Smoker    Quit date: 09/09/1987  . Smokeless tobacco: Current User  . Alcohol Use: No  . Drug Use: No  . Sexual Activity: Not Currently   Other Topics Concern  . Not on file   Social History Narrative   From [place]. Lived [place] since 1960's. Married 47 years - widowed. 2 dtrs. Retired. Lives alone and independently but his dtr is close by.    Family Status  Relation Status Death Age  . Mother Deceased 52  . Father Deceased 6    hard liver, heavy drinker  . Brother Alive   . Brother Alive   . Brother Alive    Family History  Problem Relation Age of Onset  . Coronary artery disease    . Hyperlipidemia Mother   . Heart disease Mother     had artifical valve  . Heart disease Brother   . Hyperlipidemia Brother   . COPD Brother   . Heart disease Brother     CABG, valve replacement  . Alcohol abuse Brother      ROS:  Full 14 point review of systems complete and found to be negative unless listed above.  Physical Exam: Blood pressure  123/58, pulse 71, temperature 101.5 F (38.6 C), temperature source Rectal, resp. rate 20, height 5' 8.11" (1.73 m), weight 145 lb 8.1 oz (66 kg), SpO2 93.00%.  General: Well developed, well nourished, male in no acute distress Head: Eyes PERRLA, No xanthomas.   Normocephalic and atraumatic, oropharynx without edema or  exudate. Dentition: poor Lungs: decreased BS bases with rails Heart: HRRR S1, S2 a little muffled, no rub/gallop, 3/6 murmur. pulses are 2+ both upper extrem. Slightly decreased pulses both lower extremities   Neck: Bilateral carotid bruits, possibly radiation of murmur. No lymphadenopathy.  JVD minimally elevated. Abdomen: Bowel sounds present, abdomen soft and non-tender without masses or hernias noted. Msk:  No spine or cva tenderness. No weakness, no joint deformities or effusions. Extremities: No clubbing or cyanosis. No edema.  Neuro: Alert and oriented X 3. No focal deficits noted. Psych:  Good affect, responds appropriately Skin: No rashes or lesions noted.  Labs:  Lab Results  Component Value Date   WBC 5.1 03/18/2014   HGB 11.6* 03/18/2014   HCT 34.0* 03/18/2014   MCV 88.7 03/18/2014   PLT 141* 03/18/2014     Recent Labs Lab 03/18/14 0534 03/18/14 0540  NA 142 141  K 3.8 3.7  CL 103 105  CO2 24  --   BUN 18 18  CREATININE 1.10 1.20  CALCIUM 9.0  --   PROT 7.1  --   BILITOT 0.5  --   ALKPHOS 84  --   ALT 21  --   AST 31  --   GLUCOSE 102* 101*  ALBUMIN 3.8  --    Lab Results  Component Value Date   CKTOTAL 186 03/18/2014   CKMB 13.7* 03/18/2014   TROPONINI 1.49* 03/18/2014    Recent Labs  03/18/14 0538 03/18/14 0733  TROPIPOC 0.13* 0.29*   Lipase  Date/Time Value Ref Range Status  03/18/2014  5:34 AM 37  11 - 59 U/L Final   Echo: 12/17/2011 - Left ventricle: The cavity size was normal. Wall thickness was normal. Systolic function was normal. The estimated ejection fraction was in the range of 55% to 60%. Wall motion was normal; there were  no regional wall motion abnormalities. Doppler parameters are consistent with abnormal left ventricular relaxation (grade 1 diastolic dysfunction). - Aortic valve: There was moderate to severe stenosis. Mild regurgitation. Mean gradient: 36mm Hg (S). Peak gradient: 61mm Hg (S). - Mitral valve: Calcified annulus. - Left atrium: The atrium was mildly dilated. - Pulmonary arteries: Systolic pressure was mildly increased. PA peak pressure: 36mm Hg (S).  ECG: 03/18/2014 SR, inferolateral ST abnormalities, lateral changes are significantly different from 2012, rate 89  Cardiac Cath: 05/09/2011 Angiography: The left main has mild calcifications. There is no  significant stenosis.  The left anterior descending artery is very calcified. There is a very  long complex irregular stenosis involving the proximal mid LAD. This  stenosis ranges between 75% and 80%. It involves the first diagonal  vessel. The remaining mid and distal LAD has minor luminal  irregularities.  The first diagonal artery has a 75%-80% stenosis in the proximal  segment.  The left circumflex artery is large and dominant. There is a very high  obtuse marginal artery that is subtotaled. It is very hazy. There is  TIMI grade 3 flow down this vessel.  The proximal and mid stents are patent. There is a 40%-50% stenosis  between these two stents. The distal LAD circumflex artery has a 70%  stenosis in the proximal posterior descending artery. The remaining  posterolateral branches are fairly normal.  The collateral filling can be seen coming from the circumflex vessel to  the right coronary artery.  Right coronary artery: The right coronary artery is very small and is  nondominant. It is occluded at its mid segment. There are  collaterals  coming from the right-to-right system and also left-to-right collaterals.  No left ventriculogram was performed secondary to inability to cross the  valve.  COMPLICATIONS: None.   CONCLUSION:  1. Severe coronary artery disease. The patient has moderate-to-severe  coronary artery disease involving the left anterior descending  artery, left circumflex artery, and an occluded right coronary  artery. I do not think that these LAD and diagonal lesions are  amenable to the angioplasty/stenting. They are very long and  complex and also occur at a bifurcation.  2. The patient has severe aortic stenosis by echocardiography.  3. He has normal cardiac output by right heart cath. He has normal  right ventricular pressures by right heart cath.  The patient appears to be a relatively poor candidate for coronary  artery bypass grafting and AVR. I will leave that final decision up to  his primary cardiologist Dr. Charlton Haws. He also appears to be  perhaps not a very good candidate for TAVI given his degree of his  coronary artery disease. We will continue medical treatment for now.  Radiology:  Ct Angio Chest Pe W/cm &/or Wo Cm 03/18/2014   CLINICAL DATA:  Chest pain, epigastric pain, sinus drainage.  EXAM: CT ANGIOGRAPHY CHEST WITH CONTRAST  TECHNIQUE: Multidetector CT imaging of the chest was performed using the standard protocol during bolus administration of intravenous contrast. Multiplanar CT image reconstructions and MIPs were obtained to evaluate the vascular anatomy.  CONTRAST:  70mL OMNIPAQUE IOHEXOL 350 MG/ML SOLN  COMPARISON:  Chest radiograph Jan 16, 2014  FINDINGS: Adequate pulmonary arterial opacification. Main pulmonary artery is not enlarged. No pulmonary arterial filling defects to the level the sub segmental branches.  Heart size is normal, mild pericardial thickening versus effusion. Coronary artery stents suspected. Thoracic aorta is normal in course and caliber with moderate to severe calcific atherosclerosis. No lymphadenopathy by CT size criteria.  Moderate centrilobular emphysema, trace left pleural effusion. Lingular atelectasis versus scarring. Dependent  atelectasis with minimal tree-in-bud infiltrate in right greater than left lung bases. Nonocclusive soft tissue within the distal trachea, extending of the right mainstem bronchus suggesting aspiration with mild bronchial wall thickening. No pneumothorax.  Ectatic infrarenal aorta to 2.9 cm with intimal hematoma, calcific atherosclerosis. Osteopenia. No acute osseous process.  Review of the MIP images confirms the above findings.  IMPRESSION: No acute pulmonary embolism.  Nonocclusive debris within the distal trachea extending to the right mainstem bronchus suggesting aspirate.  Mild bronchial wall thickening/bronchitis. Emphysema with minimal bibasilar tree-in-bud infiltrate which may be infectious or inflammatory. Trace left pleural effusion.   Electronically Signed   By: Awilda Metro   On: 03/18/2014 06:32   Dg Chest Port 1 View 03/18/2014   CLINICAL DATA:  Chest pain.  EXAM: PORTABLE CHEST - 1 VIEW  COMPARISON:  Chest radiograph Jan 22, 2014  FINDINGS: Cardiac silhouette is unremarkable and unchanged. Moderately calcified aortic knob. Similar mild chronic interstitial changes, increased lung volumes. Strandy densities in the lung bases. No pneumothorax.  Soft tissue planes and included osseous structures are nonsuspicious, mild degenerative change of the thoracic spine.  IMPRESSION: COPD with bibasilar strandy densities likely reflecting atelectasis.   Electronically Signed   By: Awilda Metro   On: 03/18/2014 05:44    ASSESSMENT AND PLAN:   The patient was seen today by Dr. Purvis Sheffield, the patient evaluated and the data reviewed.   Active Problems:   NSTEMI (non-ST elevated myocardial infarction) - admit to step down, continue heparin, continue oral nitrates,  aspirin, statin and beta blocker. Can switch to IV nitrates if symptoms return. Continue to cycle enzymes. M.D. to review the catheterization from 2012. If enzyme elevation is minimal, may opt for continued medical therapy as he does not  routinely have chest pain.    PNA (pneumonia), likely secondary to aspiration - will ask for internal medicine consult to assist in management.    HYPOTHYROIDISM - check TSH, continue medications.    HYPERCHOLESTEROLEMIA  IIA - check profile, continue Crestor    Severe aortic stenosis - check echocardiogram, he was not felt to be a TAVR candidate in the past because of CAD.  SignedTheodore Demark, PA-C 03/18/2014 11:51 AM [facility] [phone]  Co-Sign MD

## 2014-03-18 NOTE — Consult Note (Addendum)
[facility] Medical Consultation  Maxwell ShipperRichard C Copeland ZOX:[id number] DOB: 01/28/1932 DOA: 03/18/2014 PCP: Illene RegulusMichael Norins, MD   Requesting physician: Dr. Frankey Copeland Date of consultation: 7.11.2015 Reason for consultation: possible aspiration pneumonia  Impression/Recommendations Possible PNA (pneumonia), likely secondary to aspiration: - He does have a fever, but no leukocytosis, he relates no coughing or choking with eating or drinking. He did have an upper respiratory tract infection at the end of last month that was treated with antibiotics. He does have a CT that shows some debris which looks more like a mucous plug, I have discuss it over with the pulmonary doctor and he relates that it does look like a mucous plug. His lungs are clear to auscultation with no wheezing crackles or rhonchi. We can continue him on IV clindamycin. He does have fevers that could be explained by his NSTEMI. We will start him on a flutter valve maybe give him mucolytic, and some inhaled normal saline. And probably repeat a chest x-ray in the morning. - Chest x-ray does not show an infiltrate and we can hold the antibiotics and continue flutter valve and a mucolytic. - check SLP  NSTEMI (non-ST elevated myocardial infarction)/Severe aortic stenosis - Cardiology is admitting the patient, they started him on heparin, nitrates, aspirin, statins and beta blocker. - Further management per cardiology.  HYPOTHYROIDISM - cont synthroid. Do not react to abnormal TSH in the setting of NSTEMI.   I will followup again tomorrow. Please contact me if I can be of assistance in the meanwhile. Thank you for this consultation.  Chief Complaint: Chest pain  HPI:  80 year old male with past medical history of CAD with an NSTEI 2012 and treated medically, recently treated for an upper respiratory tract infection in June which he completed his antibiotics, comes in for chest pain that started 3 days prior to admission. He relates  this has some radiation of this neck and exertion makes it worse. Rest makes it better. He relates is non-positional. He came to the ED as his chest pain got worse and he was not sleeping, his cardiac enzymes were cycled and hemodynamic changes and EKG, he also had a fever of 101.5. Cardiology was consulted and admitted him for an instant he started him on the medication as above and we were consulted for evaluation of possible aspiration pneumonia.  Review of Systems:  Constitutional:  No weight loss, night sweats, Fevers, chills, fatigue.  HEENT:  No headaches, Difficulty swallowing,Tooth/dental problems,Sore throat,  No sneezing, itching, ear ache, nasal congestion, post nasal drip,  GI:  No heartburn, indigestion, abdominal pain, nausea, vomiting, diarrhea, change in bowel habits, loss of appetite  Resp:  No shortness of breath with exertion or at rest. No excess mucus, no productive cough, No non-productive cough, No coughing up of blood.No change in color of mucus.No wheezing.No chest wall deformity  Skin:  no rash or lesions.  GU:  no dysuria, change in color of urine, no urgency or frequency. No flank pain.  Musculoskeletal:  No joint pain or swelling. No decreased range of motion. No back pain.  Psych:  No change in mood or affect. No depression or anxiety. No memory loss.    Past Medical History  Diagnosis Date  . Coronary artery disease     LAD w/ Ca++, 100% RCA, in-astent stenosis Cx, high grade lesion Ramus  . Hypercholesteremia   . Dyspnea   . Hypothyroid   . CVA (cerebral infarction)   . Arthritis     hips, will  give out  . Varicella   . Depression     never took any medication.  . Emphysema of lung     smoking related.  . Hypertension   . Stroke     left hemiparesis-100% recovery  . AAA (abdominal aortic aneurysm)     small  . Severe aortic stenosis 2012   Past Surgical History  Procedure Laterality Date  . Coronary angioplasty with stent placement       Several stents, last in 2004   Social History:  reports that he quit smoking about 26 years ago. He uses smokeless tobacco. He reports that he does not drink alcohol or use illicit drugs.  Allergies  Allergen Reactions  . Prednisone Palpitations    See 02/07/14 he feels this caused chest pain   Family History  Problem Relation Age of Onset  . Coronary artery disease    . Hyperlipidemia Mother   . Heart disease Mother     had artifical valve  . Heart disease Brother   . Hyperlipidemia Brother   . COPD Brother   . Heart disease Brother     CABG, valve replacement  . Alcohol abuse Brother     Prior to Admission medications   Medication Sig Start Date End Date Taking? Authorizing Provider  amLODipine (NORVASC) 10 MG tablet Take 10 mg by mouth every morning.    Yes Historical Provider, MD  aspirin EC 325 MG tablet Take 325 mg by mouth every morning.   Yes Historical Provider, MD  atorvastatin (LIPITOR) 80 MG tablet Take 80 mg by mouth every morning.   Yes Historical Provider, MD  budesonide-formoterol (SYMBICORT) 160-4.5 MCG/ACT inhaler Inhale 2 puffs into the lungs 2 (two) times daily as needed (for shortness of breath).   Yes Historical Provider, MD  clopidogrel (PLAVIX) 75 MG tablet Take 75 mg by mouth every morning.    Yes Historical Provider, MD  isosorbide mononitrate (IMDUR) 30 MG 24 hr tablet Take 30 mg by mouth every morning.   Yes Historical Provider, MD  levothyroxine (SYNTHROID, LEVOTHROID) 112 MCG tablet Take 112 mcg by mouth daily before breakfast.    Yes Historical Provider, MD  metoprolol (LOPRESSOR) 50 MG tablet Take 50 mg by mouth every morning.    Yes Historical Provider, MD  niacin (SLO-NIACIN) 500 MG tablet Take 500 mg by mouth every morning.    Yes Historical Provider, MD  nitroGLYCERIN (NITROSTAT) 0.4 MG SL tablet Place 0.4 mg under the tongue every 5 (five) minutes as needed for chest pain.   Yes Historical Provider, MD  ranitidine (ZANTAC) 150 MG tablet Take 150  mg by mouth every morning.    Yes Historical Provider, MD  vitamin E 100 UNIT capsule Take 100 Units by mouth every morning.    Yes Historical Provider, MD   Physical Exam: Blood pressure 123/58, pulse 71, temperature 101.5 F (38.6 C), temperature source Rectal, resp. rate 20, height 5' 8.11" (1.73 m), weight 66 kg (145 lb 8.1 oz), SpO2 93.00%. Filed Vitals:   03/18/14 1115  BP: 123/58  Pulse: 71  Temp:   Resp: 20     General:  He is awake alert and oriented x3  Eyes: Anicteric  ENT: Moist mucous membranes  Neck: No JVD  Cardiovascular: Regular rate and rhythm is appreciated diminished S2.  Respiratory: Good air movement clear to auscultation  Abdomen: Positive bowel sounds nontender nondistended and soft  Skin: Intact  Musculoskeletal: Intact  Psychiatric: Appropriate  Neurologic: Nonfocal  Labs on  Admission:  Basic Metabolic Panel:  Recent Labs Lab 03/18/14 0534 03/18/14 0540  NA 142 141  K 3.8 3.7  CL 103 105  CO2 24  --   GLUCOSE 102* 101*  BUN 18 18  CREATININE 1.10 1.20  CALCIUM 9.0  --    Liver Function Tests:  Recent Labs Lab 03/18/14 0534  AST 31  ALT 21  ALKPHOS 84  BILITOT 0.5  PROT 7.1  ALBUMIN 3.8    Recent Labs Lab 03/18/14 0534  LIPASE 37   No results found for this basename: AMMONIA,  in the last 168 hours CBC:  Recent Labs Lab 03/18/14 0534 03/18/14 0540  WBC 5.1  --   NEUTROABS 4.0  --   HGB 10.9* 11.6*  HCT 32.9* 34.0*  MCV 88.7  --   PLT 141*  --    Cardiac Enzymes:  Recent Labs Lab 03/18/14 0913  CKTOTAL 186  CKMB 13.7*  TROPONINI 1.49*   BNP: No components found with this basename: POCBNP,  CBG:  Recent Labs Lab 03/18/14 0631  GLUCAP 96    Radiological Exams on Admission: Ct Angio Chest Pe W/cm &/or Wo Cm  03/18/2014   CLINICAL DATA:  Chest pain, epigastric pain, sinus drainage.  EXAM: CT ANGIOGRAPHY CHEST WITH CONTRAST  TECHNIQUE: Multidetector CT imaging of the chest was performed using  the standard protocol during bolus administration of intravenous contrast. Multiplanar CT image reconstructions and MIPs were obtained to evaluate the vascular anatomy.  CONTRAST:  70mL OMNIPAQUE IOHEXOL 350 MG/ML SOLN  COMPARISON:  Chest radiograph Jan 16, 2014  FINDINGS: Adequate pulmonary arterial opacification. Main pulmonary artery is not enlarged. No pulmonary arterial filling defects to the level the sub segmental branches.  Heart size is normal, mild pericardial thickening versus effusion. Coronary artery stents suspected. Thoracic aorta is normal in course and caliber with moderate to severe calcific atherosclerosis. No lymphadenopathy by CT size criteria.  Moderate centrilobular emphysema, trace left pleural effusion. Lingular atelectasis versus scarring. Dependent atelectasis with minimal tree-in-bud infiltrate in right greater than left lung bases. Nonocclusive soft tissue within the distal trachea, extending of the right mainstem bronchus suggesting aspiration with mild bronchial wall thickening. No pneumothorax.  Ectatic infrarenal aorta to 2.9 cm with intimal hematoma, calcific atherosclerosis. Osteopenia. No acute osseous process.  Review of the MIP images confirms the above findings.  IMPRESSION: No acute pulmonary embolism.  Nonocclusive debris within the distal trachea extending to the right mainstem bronchus suggesting aspirate.  Mild bronchial wall thickening/bronchitis. Emphysema with minimal bibasilar tree-in-bud infiltrate which may be infectious or inflammatory. Trace left pleural effusion.   Electronically Signed   By: Awilda Metro   On: 03/18/2014 06:32   Dg Chest Port 1 View  03/18/2014   CLINICAL DATA:  Chest pain.  EXAM: PORTABLE CHEST - 1 VIEW  COMPARISON:  Chest radiograph Jan 22, 2014  FINDINGS: Cardiac silhouette is unremarkable and unchanged. Moderately calcified aortic knob. Similar mild chronic interstitial changes, increased lung volumes. Strandy densities in the lung  bases. No pneumothorax.  Soft tissue planes and included osseous structures are nonsuspicious, mild degenerative change of the thoracic spine.  IMPRESSION: COPD with bibasilar strandy densities likely reflecting atelectasis.   Electronically Signed   By: Awilda Metro   On: 03/18/2014 05:44    EKG: Independently reviewed. Sinus rhythm with ST segment depression in the inferior leads 2,3 and aVF and V4 through V6 as her cervical changes in aVR  Time spent: 80 minutes  FELIZ ORTIZ, Estuardo Frisbee  [facility] Pager [phone]  If 7PM-7AM, please contact night-coverage [email or web address] Password [facility] 03/18/2014, 12:39 PM

## 2014-03-18 NOTE — ED Notes (Signed)
Cardiology PA at bedside. 

## 2014-03-18 NOTE — Consult Note (Signed)
The patient was seen and examined, and I agree with the assessment and plan as documented above, with modifications as noted below. 80 yr old male who has not followed up with cardiology since 08/2011 who presents with chest pain since Thursday night in the setting of severe CAD and aortic stenosis. Initial serum troponin 1.49 with ECG demonstrating sinus rhythm with LVH and repolarization abnormalities, consistent with NSTEMI. Previous cath in 04/2101 documented above. At that time, he refused CABG/AVR and was medically managed. He has also been having flu-like symptoms with a fever, and CT demonstrates nonocclusive debris in distal trachea extending into right mainstem bronchus, consistent with aspiration pneumonia. He currently denies chest pain and shortness of breath, but does complain of flu-like symptoms. I had a long discussion with patient, and while he is amenable to cath and PCI, he refuses to undergo CABG/AVR.  RECS: Will initiate heparin and treat with ASA, beta blocker, statin, and nitrates. Cycle cardiac enzymes and check P2Y12 level, as he may require Brilinta or Effient after cath. Will plan for coronary angiography in early part of next week but should he become hemodynamically unstable due to cardiac etiology (and not sepsis), will pursue sooner. Will obtain echo to assess interval change in LV systolic function and regional wall motion, as well as to assess aortic stenosis severity. S2 is markedly diminished thus I suspect it is severe if not critical (previously moderate to severe on 12/17/11 with mean gradient 36 mmHg). Currently denies chest pain and shortness of breath and is hemodynamically stable. Will treat aspiration pneumonia with azithromycin and ceftriaxone, and obtain assistance by internal medicine service. Currently has normal WBC count and neutrophilia. He is febrile. Obtain blood cultures.

## 2014-03-18 NOTE — ED Notes (Signed)
Results of troponin given to Dr. Arnoldo MoraleManley

## 2014-03-18 NOTE — Progress Notes (Signed)
ANTICOAGULATION CONSULT NOTE   Pharmacy Consult for heparin Indication: NSTEMI  Allergies  Allergen Reactions  . Prednisone Palpitations    See 02/07/14 he feels this caused chest pain    Patient Measurements: Height: 5\' 8"  (172.7 cm) Weight: 142 lb 6.7 oz (64.6 kg) IBW/kg (Calculated) : 68.4  Vital Signs: Temp: 97.9 F (36.6 C) (07/11 2000) Temp src: Oral (07/11 2000) BP: 122/71 mmHg (07/11 2000) Pulse Rate: 87 (07/11 2000)  Labs:  Recent Labs  03/18/14 0534 03/18/14 0540 03/18/14 0913 03/18/14 1442 03/18/14 1443 03/18/14 1540 03/18/14 2040  HGB 10.9* 11.6*  --   --   --   --   --   HCT 32.9* 34.0*  --   --   --   --   --   PLT 141*  --   --   --   --   --   --   LABPROT  --   --   --   --   --  15.5*  --   INR  --   --   --   --   --  1.23  --   HEPARINUNFRC  --   --   --   --  0.26* 0.36 0.35  CREATININE 1.10 1.20  --   --   --   --   --   CKTOTAL  --   --  186  --   --   --   --   CKMB  --   --  13.7*  --   --   --   --   TROPONINI  --   --  1.49* 4.84*  --   --  6.24*    Estimated Creatinine Clearance: 45.6 ml/min (by C-G formula based on Cr of 1.2).   Medications:  . heparin 800 Units/hr (03/18/14 1857)    Assessment: 80 y/o male with NSTEM for heparin Goal of Therapy:  Heparin level 0.3-0.7 units/ml Monitor platelets by anticoagulation protocol: Yes   Plan:  Continue Heparin at current rate  Geannie RisenGreg Ceira Hoeschen, PharmD, BCPS   03/18/2014 10:42 PM

## 2014-03-18 NOTE — H&P (Addendum)
Patient ID:  Maxwell Copeland  MRN: [id number]  DOB/AGE: 1931/09/15 80 y.o.  Admit date: 03/18/2014  Primary Physician Illene Regulus, MD  Primary Cardiologist PN  Reason for Consultation Chest pain   Maxwell Copeland is a 80 y.o. male with a history of CAD. He had a NSTEMI in 2012 with a peak troponin of 1.4. The cath report from that admission is below, he was treated medically.  Last week, he ate hamburgers, not necessarily chewing very well. His appetite has been poor recently, not eating very well. No known fevers. He has not been coughing more than usual. Had a URI in late May, completing antibiotics. Over the last few days, he has been eating poorly and says he has no appetite.  3 days ago, he developed chest pain. It was in the center of his chest and went up into his neck. This pain was made worse by tilting his head back. When he tilts his head back he will get pain in the back of his neck that radiates through to his chest. He describes the chest pain as a burning. He states it was severe but has trouble putting a number to it. He tried aspirin, sublingual nitroglycerin, and Gas-X. He did not feel any of these medications made a significant difference. The pain would wax and wane, but did not resolve. He does not feel that coughing or exertion made a difference. There was no association with position, deep inspiration or meals. The only thing that made it worse was tilted his head back. By description, he was probably not sleeping very well either. EMS was called but the patient initially refused transport.  He ate a minimal amount of soup yesterday. Today, he just seemed a little worse and was brought into the emergency room by EMS. In the emergency room he was febrile with temperature 101.5, and a CT of his chest is abnormal with possible infiltrate and possible foreign material, concerning for aspirated food. He has been started on antibiotics by the emergency room physician. He can  not say when his chest pain resolved, but says it was down to a moderate amount upon arrival and is now 0.   Past Medical History   Diagnosis  Date   .  Coronary artery disease      LAD w/ Ca++, 100% RCA, in-astent stenosis Cx, high grade lesion Ramus   .  Hypercholesteremia    .  Dyspnea    .  Hypothyroid    .  CVA (cerebral infarction)    .  Arthritis      hips, will give out   .  Varicella    .  Depression      never took any medication.   .  Emphysema of lung      smoking related.   .  Hypertension    .  Stroke      left hemiparesis-100% recovery   .  AAA (abdominal aortic aneurysm)      small   .  Severe aortic stenosis  2012    Past Surgical History   Procedure  Laterality  Date   .  Coronary angioplasty with stent placement       Several stents, last in 2004    Allergies   Allergen  Reactions   .  Prednisone  Palpitations     See 02/07/14 he feels this caused chest pain    I have reviewed the patient's current medications  .  sodium  chloride       Medication  Sig   acyclovir (ZOVIRAX) 800 MG tablet  Take 1 tablet (800 mg total) by mouth 5 (five) times daily.   amLODipine (NORVASC) 10 MG tablet  Take 10 mg by mouth daily.   aspirin 325 MG tablet  Take 81 mg by mouth daily.   B Complex Vitamins (B COMPLEX PO)  Take by mouth.   budesonide-formoterol (SYMBICORT) 160-4.5 MCG/ACT inhaler  Inhale 2 puffs into the lungs 2 (two) times daily.   clopidogrel (PLAVIX) 75 MG tablet  Take 75 mg by mouth daily.   Cyanocobalamin (VITAMIN B 12 PO)  Take by mouth.   diclofenac (VOLTAREN) 75 MG EC tablet  Take 1 tablet (75 mg total) by mouth 2 (two) times daily with a meal. X 1 week, then as needed for back/leg pain   HYDROcodone-homatropine (HYCODAN) 5-1.5 MG/5ML syrup  Take 5 mLs by mouth every 8 (eight) hours as needed for cough.   isosorbide mononitrate (IMDUR) 30 MG 24 hr tablet  Take 1 tablet (30 mg total) by mouth daily.   levofloxacin (LEVAQUIN) 500 MG tablet  Take 1 tablet  (500 mg total) by mouth daily.   levothyroxine (SYNTHROID, LEVOTHROID) 112 MCG tablet  Take 112 mcg by mouth daily.   metoprolol (LOPRESSOR) 50 MG tablet  Take 50 mg by mouth daily.   niacin (SLO-NIACIN) 500 MG tablet  Take 500 mg by mouth at bedtime.   nitroGLYCERIN (NITROSTAT) 0.4 MG SL tablet  Place 1 tablet (0.4 mg total) under the tongue every 5 (five) minutes as needed for chest pain.   Pyridoxine HCl (VITAMIN B-6 PO)  Take by mouth.   ranitidine (ZANTAC) 150 MG tablet  Take 150 mg by mouth daily.   rosuvastatin (CRESTOR) 40 MG tablet  Take 40 mg by mouth daily.   traMADol (ULTRAM) 50 MG tablet  Take 1 tablet (50 mg total) by mouth every 8 (eight) hours as needed for pain.   vitamin E 100 UNIT capsule  Take by mouth daily.    History    Social History   .  Marital Status:  Widowed     Spouse Name:  N/A     Number of Children:  N/A   .  Years of Education:  N/A    Occupational History   .  retired     Social History Main Topics   .  Smoking status:  Former Smoker     Quit date:  09/09/1987   .  Smokeless tobacco:  Current User   .  Alcohol Use:  No   .  Drug Use:  No   .  Sexual Activity:  Not Currently    Other Topics  Concern   .  Not on file    Social History Narrative    From [place]. Lived [place] since 1960's. Married 47 years - widowed. 2 dtrs. Retired. Lives alone and independently but his dtr is close by.    Family Status   Relation  Status  Death Age   .  Mother  Deceased  37   .  Father  Deceased  85     hard liver, heavy drinker   .  Brother  Alive    .  Brother  Alive    .  Brother  Alive     Family History   Problem  Relation  Age of Onset   .  Coronary artery disease     .  Hyperlipidemia  Mother    .  Heart disease  Mother      had artifical valve   .  Heart disease  Brother    .  Hyperlipidemia  Brother    .  COPD  Brother    .  Heart disease  Brother      CABG, valve replacement   .  Alcohol abuse  Brother     ROS: Full 14 point review  of systems complete and found to be negative unless listed above.   Physical Exam:  Blood pressure 123/58, pulse 71, temperature 101.5 F (38.6 C), temperature source Rectal, resp. rate 20, height 5' 8.11" (1.73 m), weight 145 lb 8.1 oz (66 kg), SpO2 93.00%.  General: Well developed, well nourished, male in no acute distress  Head: Eyes PERRLA, No xanthomas. Normocephalic and atraumatic, oropharynx without edema or exudate. Dentition: poor  Lungs: decreased BS bases with rails  Heart: HRRR S1, S2 a little muffled, no rub/gallop, 3/6 murmur. pulses are 2+ both upper extrem. Slightly decreased pulses both lower extremities  Neck: Bilateral carotid bruits, possibly radiation of murmur. No lymphadenopathy. JVD minimally elevated.  Abdomen: Bowel sounds present, abdomen soft and non-tender without masses or hernias noted.  Msk: No spine or cva tenderness. No weakness, no joint deformities or effusions.  Extremities: No clubbing or cyanosis. No edema.  Neuro: Alert and oriented X 3. No focal deficits noted.  Psych: Good affect, responds appropriately  Skin: No rashes or lesions noted.    Labs:  Lab Results   Component  Value  Date    WBC  5.1  03/18/2014    HGB  11.6*  03/18/2014    HCT  34.0*  03/18/2014    MCV  88.7  03/18/2014    PLT  141*  03/18/2014     Recent Labs  Lab  03/18/14 0534  03/18/14 0540   NA  142  141   K  3.8  3.7   CL  103  105   CO2  24  --   BUN  18  18   CREATININE  1.10  1.20   CALCIUM  9.0  --   PROT  7.1  --   BILITOT  0.5  --   ALKPHOS  84  --   ALT  21  --   AST  31  --   GLUCOSE  102*  101*   ALBUMIN  3.8  --    Lab Results   Component  Value  Date    CKTOTAL  186  03/18/2014    CKMB  13.7*  03/18/2014    TROPONINI  1.49*  03/18/2014     Recent Labs   03/18/14 0538  03/18/14 0733   TROPIPOC  0.13*  0.29*    Lipase   Date/Time  Value  Ref Range  Status   03/18/2014 5:34 AM  37  11 - 59 U/L  Final    Echo: 12/17/2011  - Left ventricle: The  cavity size was normal. Wall thickness was normal. Systolic function was normal. The estimated ejection fraction was in the range of 55% to 60%. Wall motion was normal; there were no regional wall motion abnormalities. Doppler parameters are consistent with abnormal left ventricular relaxation (grade 1 diastolic dysfunction). - Aortic valve: There was moderate to severe stenosis. Mild regurgitation. Mean gradient: 36mm Hg (S). Peak gradient: 61mm Hg (S). - Mitral valve: Calcified annulus. - Left atrium: The atrium was mildly dilated. -  Pulmonary arteries: Systolic pressure was mildly increased. PA peak pressure: 36mm Hg (S).  ECG: 03/18/2014  SR, inferolateral ST abnormalities, lateral changes are significantly different from 2012, rate 89   Cardiac Cath: 05/09/2011  Angiography: The left main has mild calcifications. There is no  significant stenosis.  The left anterior descending artery is very calcified. There is a very  long complex irregular stenosis involving the proximal mid LAD. This  stenosis ranges between 75% and 80%. It involves the first diagonal  vessel. The remaining mid and distal LAD has minor luminal  irregularities.  The first diagonal artery has a 75%-80% stenosis in the proximal  segment.  The left circumflex artery is large and dominant. There is a very high  obtuse marginal artery that is subtotaled. It is very hazy. There is  TIMI grade 3 flow down this vessel.  The proximal and mid stents are patent. There is a 40%-50% stenosis  between these two stents. The distal LAD circumflex artery has a 70%  stenosis in the proximal posterior descending artery. The remaining  posterolateral branches are fairly normal.  The collateral filling can be seen coming from the circumflex vessel to  the right coronary artery.  Right coronary artery: The right coronary artery is very small and is  nondominant. It is occluded at its mid segment. There are collaterals  coming  from the right-to-right system and also left-to-right collaterals.  No left ventriculogram was performed secondary to inability to cross the  valve.  COMPLICATIONS: None.   CONCLUSION:  1. Severe coronary artery disease. The patient has moderate-to-severe  coronary artery disease involving the left anterior descending  artery, left circumflex artery, and an occluded right coronary  artery. I do not think that these LAD and diagonal lesions are  amenable to the angioplasty/stenting. They are very long and  complex and also occur at a bifurcation.  2. The patient has severe aortic stenosis by echocardiography.  3. He has normal cardiac output by right heart cath. He has normal  right ventricular pressures by right heart cath.  The patient appears to be a relatively poor candidate for coronary  artery bypass grafting and AVR. I will leave that final decision up to  his primary cardiologist Dr. Charlton Haws. He also appears to be  perhaps not a very good candidate for TAVI given his degree of his  coronary artery disease. We will continue medical treatment for now.   Radiology: Ct Angio Chest Pe W/cm &/or Wo Cm  03/18/2014 CLINICAL DATA: Chest pain, epigastric pain, sinus drainage. EXAM: CT ANGIOGRAPHY CHEST WITH CONTRAST TECHNIQUE: Multidetector CT imaging of the chest was performed using the standard protocol during bolus administration of intravenous contrast. Multiplanar CT image reconstructions and MIPs were obtained to evaluate the vascular anatomy. CONTRAST: 70mL OMNIPAQUE IOHEXOL 350 MG/ML SOLN COMPARISON: Chest radiograph Jan 16, 2014 FINDINGS: Adequate pulmonary arterial opacification. Main pulmonary artery is not enlarged. No pulmonary arterial filling defects to the level the sub segmental branches. Heart size is normal, mild pericardial thickening versus effusion. Coronary artery stents suspected. Thoracic aorta is normal in course and caliber with moderate to severe calcific  atherosclerosis. No lymphadenopathy by CT size criteria. Moderate centrilobular emphysema, trace left pleural effusion. Lingular atelectasis versus scarring. Dependent atelectasis with minimal tree-in-bud infiltrate in right greater than left lung bases. Nonocclusive soft tissue within the distal trachea, extending of the right mainstem bronchus suggesting aspiration with mild bronchial wall thickening. No pneumothorax. Ectatic infrarenal aorta to 2.9 cm with  intimal hematoma, calcific atherosclerosis. Osteopenia. No acute osseous process. Review of the MIP images confirms the above findings. IMPRESSION: No acute pulmonary embolism. Nonocclusive debris within the distal trachea extending to the right mainstem bronchus suggesting aspirate. Mild bronchial wall thickening/bronchitis. Emphysema with minimal bibasilar tree-in-bud infiltrate which may be infectious or inflammatory. Trace left pleural effusion. Electronically Signed By: Awilda Metroourtnay Bloomer On: 03/18/2014 06:32  Dg Chest Port 1 View  03/18/2014 CLINICAL DATA: Chest pain. EXAM: PORTABLE CHEST - 1 VIEW COMPARISON: Chest radiograph Jan 22, 2014 FINDINGS: Cardiac silhouette is unremarkable and unchanged. Moderately calcified aortic knob. Similar mild chronic interstitial changes, increased lung volumes. Strandy densities in the lung bases. No pneumothorax. Soft tissue planes and included osseous structures are nonsuspicious, mild degenerative change of the thoracic spine. IMPRESSION: COPD with bibasilar strandy densities likely reflecting atelectasis. Electronically Signed By: Awilda Metroourtnay Bloomer On: 03/18/2014 05:44   ASSESSMENT AND PLAN: The patient was seen today by Dr. Purvis SheffieldKoneswaran, the patient evaluated and the data reviewed.  Active Problems:  NSTEMI (non-ST elevated myocardial infarction) - admit to step down, continue heparin, continue oral nitrates, aspirin, statin and beta blocker. Can switch to IV nitrates if symptoms return. Continue to cycle enzymes.  M.D. to review the catheterization from 2012. If enzyme elevation is minimal, may opt for continued medical therapy as he does not routinely have chest pain.  PNA (pneumonia), likely secondary to aspiration - will ask for internal medicine consult to assist in management.  HYPOTHYROIDISM - check TSH, continue medications.  HYPERCHOLESTEROLEMIA IIA - check profile, continue Crestor  Severe aortic stenosis - check echocardiogram, he was not felt to be a TAVR candidate in the past because of CAD.   Signed:  Theodore DemarkRhonda Barrett, PA-C  03/18/2014  11:51 AM  Beeper [phone]  Co-Sign MD   The patient was seen and examined, and I agree with the assessment and plan as documented by R. Barrett PA-C, with modifications as noted below.  80 yr old male who has not followed up with cardiology since 08/2011 who presents with chest pain since Thursday night in the setting of severe CAD and aortic stenosis.  Initial serum troponin 1.49 with ECG demonstrating sinus rhythm with LVH and repolarization abnormalities, consistent with NSTEMI.  Previous cath in 04/2101 documented above. At that time, he refused CABG/AVR and was medically managed.  He has also been having flu-like symptoms with a fever, and CT demonstrates nonocclusive debris in distal trachea extending into right mainstem bronchus, consistent with aspiration pneumonia.  He currently denies chest pain and shortness of breath, but does complain of flu-like symptoms.  I had a long discussion with patient, and while he is amenable to cath and PCI, he refuses to undergo CABG/AVR.   RECS:  Will initiate heparin and treat with ASA, beta blocker, statin, and nitrates. Cycle cardiac enzymes and check P2Y12 level, as he may require Brilinta or Effient after cath. Will plan for coronary angiography in early part of next week but should he become hemodynamically unstable due to cardiac etiology (and not sepsis), will pursue sooner.  Will obtain echo to assess interval  change in LV systolic function and regional wall motion, as well as to assess aortic stenosis severity. S2 is markedly diminished thus I suspect it is severe if not critical (previously moderate to severe on 12/17/11 with mean gradient 36 mmHg).  Currently denies chest pain and shortness of breath and is hemodynamically stable.  Will treat aspiration pneumonia with azithromycin and ceftriaxone, and obtain assistance by internal medicine  service. Currently has normal WBC count and neutrophilia. He is febrile. Obtain blood cultures.

## 2014-03-18 NOTE — Progress Notes (Signed)
  Echocardiogram 2D Echocardiogram has been performed.  Tayari Yankee FRANCES 03/18/2014, 4:14 PM

## 2014-03-19 ENCOUNTER — Inpatient Hospital Stay (HOSPITAL_COMMUNITY): Payer: Medicare Other

## 2014-03-19 DIAGNOSIS — R0602 Shortness of breath: Secondary | ICD-10-CM

## 2014-03-19 DIAGNOSIS — I6350 Cerebral infarction due to unspecified occlusion or stenosis of unspecified cerebral artery: Secondary | ICD-10-CM

## 2014-03-19 DIAGNOSIS — Z7189 Other specified counseling: Secondary | ICD-10-CM

## 2014-03-19 LAB — LIPID PANEL
Cholesterol: 101 mg/dL (ref 0–200)
HDL: 41 mg/dL (ref >39)
LDL Cholesterol: 52 mg/dL (ref 0–99)
Total CHOL/HDL Ratio: 2.5 RATIO
Triglycerides: 40 mg/dL (ref <150)
VLDL: 8 mg/dL (ref 0–40)

## 2014-03-19 LAB — COMPREHENSIVE METABOLIC PANEL
ALT: 20 U/L (ref 0–53)
AST: 48 U/L — ABNORMAL HIGH (ref 0–37)
Albumin: 3.3 g/dL — ABNORMAL LOW (ref 3.5–5.2)
Alkaline Phosphatase: 72 U/L (ref 39–117)
Anion gap: 13 (ref 5–15)
BILIRUBIN TOTAL: 0.4 mg/dL (ref 0.3–1.2)
BUN: 17 mg/dL (ref 6–23)
CALCIUM: 8.6 mg/dL (ref 8.4–10.5)
CHLORIDE: 102 meq/L (ref 96–112)
CO2: 24 meq/L (ref 19–32)
Creatinine, Ser: 1.1 mg/dL (ref 0.50–1.35)
GFR calc non Af Amer: 62 mL/min — ABNORMAL LOW (ref >90)
GFR, EST AFRICAN AMERICAN: 72 mL/min — AB (ref >90)
GLUCOSE: 86 mg/dL (ref 70–99)
Potassium: 3.8 mEq/L (ref 3.7–5.3)
SODIUM: 139 meq/L (ref 137–147)
Total Protein: 6.4 g/dL (ref 6.0–8.3)

## 2014-03-19 LAB — CBC
HCT: 32.2 % — ABNORMAL LOW (ref 39.0–52.0)
HEMOGLOBIN: 10.7 g/dL — AB (ref 13.0–17.0)
MCH: 29.8 pg (ref 26.0–34.0)
MCHC: 33.2 g/dL (ref 30.0–36.0)
MCV: 89.7 fL (ref 78.0–100.0)
Platelets: 98 10*3/uL — ABNORMAL LOW (ref 150–400)
RBC: 3.59 MIL/uL — AB (ref 4.22–5.81)
RDW: 13.5 % (ref 11.5–15.5)
WBC: 2.9 10*3/uL — AB (ref 4.0–10.5)

## 2014-03-19 LAB — HEPARIN LEVEL (UNFRACTIONATED): Heparin Unfractionated: 0.46 IU/mL (ref 0.30–0.70)

## 2014-03-19 NOTE — Progress Notes (Signed)
SUBJECTIVE: Pt denies chest pain and shortness of breath. Now saying that he would be willing to undergo coronary angiography, but not during this hospitalization. Daughter (June) present.     Intake/Output Summary (Last 24 hours) at 03/19/14 1320 Last data filed at 03/19/14 1236  Gross per 24 hour  Intake    296 ml  Output   1570 ml  Net  -1274 ml    Current Facility-Administered Medications  Medication Dose Route Frequency Provider Last Rate Last Dose  . 0.9 %  sodium chloride infusion  250 mL Intravenous PRN Rhonda G Barrett, PA-C      . acetaminophen (TYLENOL) tablet 650 mg  650 mg Oral Q4H PRN Joline SaltRhonda G Barrett, PA-C   650 mg at 03/18/14 1907  . ALPRAZolam (XANAX) tablet 0.25 mg  0.25 mg Oral BID PRN Joline Salthonda G Barrett, PA-C      . amLODipine (NORVASC) tablet 10 mg  10 mg Oral q morning - 10a Rhonda G Barrett, PA-C   10 mg at 03/19/14 0944  . aspirin EC tablet 325 mg  325 mg Oral q morning - 10a Rhonda G Barrett, PA-C   325 mg at 03/19/14 0944  . atorvastatin (LIPITOR) tablet 80 mg  80 mg Oral q morning - 10a Rhonda G Barrett, PA-C   80 mg at 03/19/14 0945  . budesonide-formoterol (SYMBICORT) 160-4.5 MCG/ACT inhaler 2 puff  2 puff Inhalation BID Joline Salthonda G Barrett, PA-C   2 puff at 03/19/14 0755  . clopidogrel (PLAVIX) tablet 75 mg  75 mg Oral q morning - 10a Rhonda G Barrett, PA-C   75 mg at 03/19/14 0944  . famotidine (PEPCID) tablet 20 mg  20 mg Oral Daily Rhonda G Barrett, PA-C   20 mg at 03/19/14 0944  . guaiFENesin (ROBITUSSIN) 100 MG/5ML solution 100 mg  5 mL Oral Q6H Marinda ElkAbraham Feliz Ortiz, MD   100 mg at 03/19/14 0710  . heparin ADULT infusion 100 units/mL (25000 units/250 mL)  800 Units/hr Intravenous Continuous Maryanna ShapeJennifer Danielle FlorisDurham, Greenb[facility] HospitalRPH 8 mL/hr at 03/18/14 2100 800 Units/hr at 03/18/14 2100  . isosorbide mononitrate (IMDUR) 24 hr tablet 30 mg  30 mg Oral q morning - 10a Rhonda G Barrett, PA-C   30 mg at 03/19/14 0945  . levothyroxine (SYNTHROID, LEVOTHROID) tablet  112 mcg  112 mcg Oral QAC breakfast Joline SaltRhonda G Barrett, PA-C   112 mcg at 03/19/14 0710  . metoprolol tartrate (LOPRESSOR) tablet 37.5 mg  37.5 mg Oral BID Joline SaltRhonda G Barrett, PA-C   37.5 mg at 03/19/14 0945  . niacin (SLO-NIACIN) CR tablet 500 mg  500 mg Oral q morning - 10a Rhonda G Barrett, PA-C   500 mg at 03/19/14 0945  . nitroGLYCERIN (NITROSTAT) SL tablet 0.4 mg  0.4 mg Sublingual Q5 Min x 3 PRN Rhonda G Barrett, PA-C      . ondansetron (ZOFRAN) injection 4 mg  4 mg Intravenous Q6H PRN Rhonda G Barrett, PA-C      . sodium chloride 0.9 % injection 3 mL  3 mL Intravenous Q12H Rhonda G Barrett, PA-C      . sodium chloride 0.9 % injection 3 mL  3 mL Intravenous PRN Rhonda G Barrett, PA-C      . vitamin E capsule 100 Units  100 Units Oral q morning - 10a Joline SaltRhonda G Barrett, PA-C   100 Units at 03/19/14 0946  . zolpidem (AMBIEN) tablet 5 mg  5 mg Oral QHS PRN Darrol Jumphonda G Barrett,  PA-C        Filed Vitals:   03/19/14 0900 03/19/14 1000 03/19/14 1050 03/19/14 1235  BP: 123/47 111/41  122/60  Pulse: 66 59 71 68  Temp:   98.3 F (36.8 C) 98 F (36.7 C)  TempSrc:   Oral Oral  Resp: 21 14 22 16   Height:      Weight:      SpO2: 96% 95% 98% 96%    PHYSICAL EXAM General: NAD Neck: No JVD, no thyromegaly.  Lungs: Clear to auscultation bilaterally with normal respiratory effort. CV: Nondisplaced PMI.  Regular rate and rhythm, normal S1/diminished S2, no S3/S4, IV/VI late peaking systolic murmur over RUSB.  No pretibial edema.    Abdomen: Soft, nontender, no hepatosplenomegaly, no distention.  Neurologic: Alert and oriented. Psych: Normal affect. Extremities: No clubbing or cyanosis.   TELEMETRY: Reviewed telemetry pt in sinus rhythm.  LABS: Basic Metabolic Panel:  Recent Labs  40/98/11 0534 03/18/14 0540 03/19/14 0400  NA 142 141 139  K 3.8 3.7 3.8  CL 103 105 102  CO2 24  --  24  GLUCOSE 102* 101* 86  BUN 18 18 17   CREATININE 1.10 1.20 1.10  CALCIUM 9.0  --  8.6   Liver Function  Tests:  Recent Labs  03/18/14 0534 03/19/14 0400  AST 31 48*  ALT 21 20  ALKPHOS 84 72  BILITOT 0.5 0.4  PROT 7.1 6.4  ALBUMIN 3.8 3.3*    Recent Labs  03/18/14 0534  LIPASE 37   CBC:  Recent Labs  03/18/14 0534 03/18/14 0540 03/19/14 0400  WBC 5.1  --  2.9*  NEUTROABS 4.0  --   --   HGB 10.9* 11.6* 10.7*  HCT 32.9* 34.0* 32.2*  MCV 88.7  --  89.7  PLT 141*  --  98*   Cardiac Enzymes:  Recent Labs  03/18/14 0913 03/18/14 1442 03/18/14 2040  CKTOTAL 186  --   --   CKMB 13.7*  --   --   TROPONINI 1.49* 4.84* 6.24*   BNP: No components found with this basename: POCBNP,  D-Dimer: No results found for this basename: DDIMER,  in the last 72 hours Hemoglobin A1C: No results found for this basename: HGBA1C,  in the last 72 hours Fasting Lipid Panel:  Recent Labs  03/19/14 0400  CHOL 101  HDL 41  LDLCALC 52  TRIG 40  CHOLHDL 2.5   Thyroid Function Tests:  Recent Labs  03/18/14 1540  TSH 0.256*   Anemia Panel: No results found for this basename: VITAMINB12, FOLATE, FERRITIN, TIBC, IRON, RETICCTPCT,  in the last 72 hours  RADIOLOGY: Dg Chest 2 View  03/19/2014   CLINICAL DATA:  History chest pain.  Bronchial debris.  EXAM: CHEST  2 VIEW  COMPARISON:  CT 03/18/2014.  Chest x-ray 03/18/2014.  FINDINGS: Mediastinum and hilar structures are normal. The lungs are clear of acute infiltrates. No pleural effusion or pneumothorax. Heart size normal. Coronary stents. No pleural effusion or pneumothorax. No acute osseus abnormality.  IMPRESSION: 1. Large airways are widely patent.  No focal infiltrate. 2. Coronary artery stents.   Electronically Signed   By: Maisie Fus  Register   On: 03/19/2014 09:26   Ct Angio Chest Pe W/cm &/or Wo Cm  03/18/2014   CLINICAL DATA:  Chest pain, epigastric pain, sinus drainage.  EXAM: CT ANGIOGRAPHY CHEST WITH CONTRAST  TECHNIQUE: Multidetector CT imaging of the chest was performed using the standard protocol during bolus  administration of intravenous contrast.  Multiplanar CT image reconstructions and MIPs were obtained to evaluate the vascular anatomy.  CONTRAST:  70mL OMNIPAQUE IOHEXOL 350 MG/ML SOLN  COMPARISON:  Chest radiograph Jan 16, 2014  FINDINGS: Adequate pulmonary arterial opacification. Main pulmonary artery is not enlarged. No pulmonary arterial filling defects to the level the sub segmental branches.  Heart size is normal, mild pericardial thickening versus effusion. Coronary artery stents suspected. Thoracic aorta is normal in course and caliber with moderate to severe calcific atherosclerosis. No lymphadenopathy by CT size criteria.  Moderate centrilobular emphysema, trace left pleural effusion. Lingular atelectasis versus scarring. Dependent atelectasis with minimal tree-in-bud infiltrate in right greater than left lung bases. Nonocclusive soft tissue within the distal trachea, extending of the right mainstem bronchus suggesting aspiration with mild bronchial wall thickening. No pneumothorax.  Ectatic infrarenal aorta to 2.9 cm with intimal hematoma, calcific atherosclerosis. Osteopenia. No acute osseous process.  Review of the MIP images confirms the above findings.  IMPRESSION: No acute pulmonary embolism.  Nonocclusive debris within the distal trachea extending to the right mainstem bronchus suggesting aspirate.  Mild bronchial wall thickening/bronchitis. Emphysema with minimal bibasilar tree-in-bud infiltrate which may be infectious or inflammatory. Trace left pleural effusion.   Electronically Signed   By: Awilda Metro   On: 03/18/2014 06:32   Dg Chest Port 1 View  03/18/2014   CLINICAL DATA:  Chest pain.  EXAM: PORTABLE CHEST - 1 VIEW  COMPARISON:  Chest radiograph Jan 22, 2014  FINDINGS: Cardiac silhouette is unremarkable and unchanged. Moderately calcified aortic knob. Similar mild chronic interstitial changes, increased lung volumes. Strandy densities in the lung bases. No pneumothorax.  Soft tissue  planes and included osseous structures are nonsuspicious, mild degenerative change of the thoracic spine.  IMPRESSION: COPD with bibasilar strandy densities likely reflecting atelectasis.   Electronically Signed   By: Awilda Metro   On: 03/18/2014 05:44    Cardiac Cath: 05/09/2011  Angiography: The left main has mild calcifications. There is no  significant stenosis.  The left anterior descending artery is very calcified. There is a very  long complex irregular stenosis involving the proximal mid LAD. This  stenosis ranges between 75% and 80%. It involves the first diagonal  vessel. The remaining mid and distal LAD has minor luminal  irregularities.  The first diagonal artery has a 75%-80% stenosis in the proximal  segment.  The left circumflex artery is large and dominant. There is a very high  obtuse marginal artery that is subtotaled. It is very hazy. There is  TIMI grade 3 flow down this vessel.  The proximal and mid stents are patent. There is a 40%-50% stenosis  between these two stents. The distal LAD circumflex artery has a 70%  stenosis in the proximal posterior descending artery. The remaining  posterolateral branches are fairly normal.  The collateral filling can be seen coming from the circumflex vessel to  the right coronary artery.  Right coronary artery: The right coronary artery is very small and is  nondominant. It is occluded at its mid segment. There are collaterals  coming from the right-to-right system and also left-to-right collaterals.  No left ventriculogram was performed secondary to inability to cross the  valve.  COMPLICATIONS: None.   CONCLUSION:  1. Severe coronary artery disease. The patient has moderate-to-severe  coronary artery disease involving the left anterior descending  artery, left circumflex artery, and an occluded right coronary  artery. I do not think that these LAD and diagonal lesions are  amenable to the  angioplasty/stenting. They  are very long and  complex and also occur at a bifurcation.  2. The patient has severe aortic stenosis by echocardiography.  3. He has normal cardiac output by right heart cath. He has normal  right ventricular pressures by right heart cath.  The patient appears to be a relatively poor candidate for coronary  artery bypass grafting and AVR. I will leave that final decision up to  his primary cardiologist Dr. Charlton Haws. He also appears to be  perhaps not a very good candidate for TAVI given his degree of his  coronary artery disease. We will continue medical treatment for now.    ASSESSMENT AND PLAN: 80 yr old male who has not followed up with cardiology since 08/2011 who presented with chest pain on 7/11 since Thursday night in the setting of severe CAD and aortic stenosis.  Initial serum troponin 1.49-->4.84-->6.24 with ECG demonstrating sinus rhythm with LVH and repolarization abnormalities, consistent with NSTEMI.  Previous cath in 04/2101 documented above. At that time, he refused CABG/AVR and was medically managed.  He had also been having flu-like symptoms with a fever (presently afebrile), and CT demonstrated nonocclusive debris in distal trachea extending into right mainstem bronchus, consistent with aspiration pneumonia.  However, evaluated by [facility] and they felt more consistent with a mucous plug. WBC normal. CXR showed no infiltrates today. He currently denies chest pain and shortness of breath.  I had a long discussion with patient on 7/11, and while he was initially amenable to cath and PCI, he refuses to undergo CABG/AVR.  Echo showed normal LV systolic function and regional wall motion with severe aortic stenosis, mild to moderate aortic regurgitation, mild MR, and grade I diastolic dysfunction. Now, however, he prefers to avoid coronary angiography during this hospitalization but would consider one later. I again spoke with both the patient and his daughter at length, and they  will discuss it further.  RECS:  Will continue heparin, ASA, beta blocker, statin, and nitrates.  Pt and daughter to discuss potential coronary angiography, thus will not schedule at this point. Currently denies chest pain and shortness of breath and is hemodynamically stable.  Antibiotics have been discontinued.     Prentice Docker, M.D., F.A.C.C.

## 2014-03-19 NOTE — Progress Notes (Signed)
[facility] PROGRESS CONSULT NOTE  Filed Weights   03/18/14 0707 03/18/14 1429 03/19/14 0500  Weight: 66 kg (145 lb 8.1 oz) 64.6 kg (142 lb 6.7 oz) 64.048 kg (141 lb 3.2 oz)        Intake/Output Summary (Last 24 hours) at 03/19/14 0740 Last data filed at 03/19/14 0533  Gross per 24 hour  Intake    296 ml  Output   1170 ml  Net   -874 ml    Assessment/Plan: PNA (pneumonia), Ulikely secondary to aspiration - asx, no cough, no fevers overnight. - lung exam clear. CXR pending.will cont to follow.  NSTEMI (non-ST elevated myocardial infarction) - Cont on heparin, nitrates, aspirin, statins and beta blocker.  - ECHO as below  Severe aortic stenosis - per cards.  HYPERCHOLESTEROLEMIA  IIA - statins    Code Status: full Family Communication: none  Disposition Plan: inpatient   Procedures: ECHO 7.11.2015: estimated ejection 55% to 60%. Wall motion was normal; grade 1 diastolic dysfunction). The E/e&' ratio is between 8-15, suggesting indeterminate LV filling pressure.- Aortic valve: Moderately calcified aortic valve with restricted peak and mean gradients of 64-73 and 35-40 mmHg, respectively.There was mild to moderate regurgitation. Valve area (VTI): 0.65 cm^2. Valve area (Vmax): 0.77 cm^2.  Antibiotics:  Clindamycin: 7.11.2015  HPI/Subjective: No cp or SOB  Objective: Filed Vitals:   03/18/14 2145 03/19/14 0000 03/19/14 0401 03/19/14 0500  BP:  119/54 109/49   Pulse:  57 58   Temp:  98.5 F (36.9 C) 97.8 F (36.6 C)   TempSrc:  Oral Oral   Resp:  18 19   Height:      Weight:    64.048 kg (141 lb 3.2 oz)  SpO2: 96% 96% 95%      Exam:  General: Alert, awake, oriented x3, in no acute distress.  HEENT: No bruits, no goiter.  Heart: Regular rate and rhythm. Lungs: Good air movement, clear Abdomen: Soft, nontender, nondistended, positive bowel sounds.  Neuro: Grossly intact, nonfocal.   Data Reviewed: Basic Metabolic Panel:  Recent Labs Lab  03/18/14 0534 03/18/14 0540 03/19/14 0400  NA 142 141 139  K 3.8 3.7 3.8  CL 103 105 102  CO2 24  --  24  GLUCOSE 102* 101* 86  BUN 18 18 17   CREATININE 1.10 1.20 1.10  CALCIUM 9.0  --  8.6   Liver Function Tests:  Recent Labs Lab 03/18/14 0534 03/19/14 0400  AST 31 48*  ALT 21 20  ALKPHOS 84 72  BILITOT 0.5 0.4  PROT 7.1 6.4  ALBUMIN 3.8 3.3*    Recent Labs Lab 03/18/14 0534  LIPASE 37   No results found for this basename: AMMONIA,  in the last 168 hours CBC:  Recent Labs Lab 03/18/14 0534 03/18/14 0540 03/19/14 0400  WBC 5.1  --  2.9*  NEUTROABS 4.0  --   --   HGB 10.9* 11.6* 10.7*  HCT 32.9* 34.0* 32.2*  MCV 88.7  --  89.7  PLT 141*  --  98*   Cardiac Enzymes:  Recent Labs Lab 03/18/14 0913 03/18/14 1442 03/18/14 2040  CKTOTAL 186  --   --   CKMB 13.7*  --   --   TROPONINI 1.49* 4.84* 6.24*   BNP (last 3 results)  Recent Labs  03/18/14 1540  PROBNP 1374.0*   CBG:  Recent Labs Lab 03/18/14 0631  GLUCAP 96    Recent Results (from the past 240 hour(s))  MRSA PCR SCREENING  Status: None   Collection Time    03/18/14  5:27 PM      Result Value Ref Range Status   MRSA by PCR NEGATIVE  NEGATIVE Final   Comment:            The GeneXpert MRSA Assay (FDA     approved for NASAL specimens     only), is one component of a     comprehensive MRSA colonization     surveillance program. It is not     intended to diagnose MRSA     infection nor to guide or     monitor treatment for     MRSA infections.     Studies: Ct Angio Chest Pe W/cm &/or Wo Cm  03/18/2014   CLINICAL DATA:  Chest pain, epigastric pain, sinus drainage.  EXAM: CT ANGIOGRAPHY CHEST WITH CONTRAST  TECHNIQUE: Multidetector CT imaging of the chest was performed using the standard protocol during bolus administration of intravenous contrast. Multiplanar CT image reconstructions and MIPs were obtained to evaluate the vascular anatomy.  CONTRAST:  70mL OMNIPAQUE IOHEXOL 350  MG/ML SOLN  COMPARISON:  Chest radiograph Jan 16, 2014  FINDINGS: Adequate pulmonary arterial opacification. Main pulmonary artery is not enlarged. No pulmonary arterial filling defects to the level the sub segmental branches.  Heart size is normal, mild pericardial thickening versus effusion. Coronary artery stents suspected. Thoracic aorta is normal in course and caliber with moderate to severe calcific atherosclerosis. No lymphadenopathy by CT size criteria.  Moderate centrilobular emphysema, trace left pleural effusion. Lingular atelectasis versus scarring. Dependent atelectasis with minimal tree-in-bud infiltrate in right greater than left lung bases. Nonocclusive soft tissue within the distal trachea, extending of the right mainstem bronchus suggesting aspiration with mild bronchial wall thickening. No pneumothorax.  Ectatic infrarenal aorta to 2.9 cm with intimal hematoma, calcific atherosclerosis. Osteopenia. No acute osseous process.  Review of the MIP images confirms the above findings.  IMPRESSION: No acute pulmonary embolism.  Nonocclusive debris within the distal trachea extending to the right mainstem bronchus suggesting aspirate.  Mild bronchial wall thickening/bronchitis. Emphysema with minimal bibasilar tree-in-bud infiltrate which may be infectious or inflammatory. Trace left pleural effusion.   Electronically Signed   By: Awilda Metroourtnay  Bloomer   On: 03/18/2014 06:32   Dg Chest Port 1 View  03/18/2014   CLINICAL DATA:  Chest pain.  EXAM: PORTABLE CHEST - 1 VIEW  COMPARISON:  Chest radiograph Jan 22, 2014  FINDINGS: Cardiac silhouette is unremarkable and unchanged. Moderately calcified aortic knob. Similar mild chronic interstitial changes, increased lung volumes. Strandy densities in the lung bases. No pneumothorax.  Soft tissue planes and included osseous structures are nonsuspicious, mild degenerative change of the thoracic spine.  IMPRESSION: COPD with bibasilar strandy densities likely  reflecting atelectasis.   Electronically Signed   By: Awilda Metroourtnay  Bloomer   On: 03/18/2014 05:44    Scheduled Meds: . amLODipine  10 mg Oral q morning - 10a  . aspirin EC  325 mg Oral q morning - 10a  . atorvastatin  80 mg Oral q morning - 10a  . budesonide-formoterol  2 puff Inhalation BID  . clopidogrel  75 mg Oral q morning - 10a  . famotidine  20 mg Oral Daily  . guaiFENesin  5 mL Oral Q6H  . isosorbide mononitrate  30 mg Oral q morning - 10a  . levothyroxine  112 mcg Oral QAC breakfast  . metoprolol  37.5 mg Oral BID  . niacin  500 mg Oral q  morning - 10a  . sodium chloride  3 mL Intravenous Q12H  . vitamin E  100 Units Oral q morning - 10a   Continuous Infusions: . heparin 800 Units/hr (03/18/14 2100)     Marinda Elk  [facility] Pager [phone] If 8PM-8AM, please contact night-coverage at [email or web address], password [facility] 03/19/2014, 7:40 AM  LOS: 1 day    **Disclaimer: This note may have been dictated with voice recognition software. Similar sounding words can inadvertently be transcribed and this note may contain transcription errors which may not have been corrected upon publication of note.**

## 2014-03-19 NOTE — Progress Notes (Signed)
ANTICOAGULATION CONSULT NOTE - Follow Up Consult  Pharmacy Consult for Heparin Indication: NSTEMI  Allergies  Allergen Reactions  . Prednisone Palpitations    See 02/07/14 he feels this caused chest pain    Patient Measurements: Height: 5\' 8"  (172.7 cm) Weight: 141 lb 3.2 oz (64.048 kg) IBW/kg (Calculated) : 68.4 Heparin Dosing Weight: 64 kg  Vital Signs: Temp: 98.3 F (36.8 C) (07/12 1050) Temp src: Oral (07/12 1050) BP: 111/41 mmHg (07/12 1000) Pulse Rate: 71 (07/12 1050)  Labs:  Recent Labs  03/18/14 0534 03/18/14 0540 03/18/14 0913 03/18/14 1442  03/18/14 1540 03/18/14 2040 03/19/14 0400  HGB 10.9* 11.6*  --   --   --   --   --  10.7*  HCT 32.9* 34.0*  --   --   --   --   --  32.2*  PLT 141*  --   --   --   --   --   --  98*  LABPROT  --   --   --   --   --  15.5*  --   --   INR  --   --   --   --   --  1.23  --   --   HEPARINUNFRC  --   --   --   --   < > 0.36 0.35 0.46  CREATININE 1.10 1.20  --   --   --   --   --  1.10  CKTOTAL  --   --  186  --   --   --   --   --   CKMB  --   --  13.7*  --   --   --   --   --   TROPONINI  --   --  1.49* 4.84*  --   --  6.24*  --   < > = values in this interval not displayed.  Estimated Creatinine Clearance: 49.3 ml/min (by C-G formula based on Cr of 1.1).  Assessment:   Heparin level remains therapeutic (0.46) on 800 units/hr. Low baseline platelet count 141, trended down to 98K today.  No bleeding reported. On Aspirin 325 mg + Plavix 75 mg daily.  Goal of Therapy:  Heparin level 0.3-0.7 units/ml Monitor platelets by anticoagulation protocol: Yes   Plan:   Continue heparin at 800 units/hr.  Daily heparin level and CBC while on heparin.  Watch platelet count; monitor for any bleeding.  Dennie FettersEgan, Croy Drumwright Donovan, RPh Pager: [phone] 03/19/2014,11:31 AM

## 2014-03-19 NOTE — Progress Notes (Signed)
Utilization Review Completed.Maxwell Copeland T7/08/2014  

## 2014-03-20 DIAGNOSIS — E440 Moderate protein-calorie malnutrition: Secondary | ICD-10-CM | POA: Diagnosis present

## 2014-03-20 LAB — CBC
HCT: 33.4 % — ABNORMAL LOW (ref 39.0–52.0)
Hemoglobin: 11.1 g/dL — ABNORMAL LOW (ref 13.0–17.0)
MCH: 29.2 pg (ref 26.0–34.0)
MCHC: 33.2 g/dL (ref 30.0–36.0)
MCV: 87.9 fL (ref 78.0–100.0)
PLATELETS: 124 10*3/uL — AB (ref 150–400)
RBC: 3.8 MIL/uL — ABNORMAL LOW (ref 4.22–5.81)
RDW: 13.4 % (ref 11.5–15.5)
WBC: 3.5 10*3/uL — AB (ref 4.0–10.5)

## 2014-03-20 LAB — HEPARIN LEVEL (UNFRACTIONATED): Heparin Unfractionated: 0.57 IU/mL (ref 0.30–0.70)

## 2014-03-20 LAB — T4, FREE: FREE T4: 1.04 ng/dL (ref 0.80–1.80)

## 2014-03-20 LAB — TSH: TSH: 0.499 u[IU]/mL (ref 0.350–4.500)

## 2014-03-20 MED ORDER — ENSURE COMPLETE PO LIQD
237.0000 mL | Freq: Two times a day (BID) | ORAL | Status: DC
  Administered 2014-03-20 – 2014-03-21 (×2): 237 mL via ORAL

## 2014-03-20 MED ORDER — METOPROLOL TARTRATE 25 MG PO TABS
25.0000 mg | ORAL_TABLET | Freq: Two times a day (BID) | ORAL | Status: DC
  Administered 2014-03-20 – 2014-03-21 (×2): 25 mg via ORAL
  Filled 2014-03-20 (×3): qty 1

## 2014-03-20 NOTE — Progress Notes (Signed)
CARDIAC REHAB PHASE I   PRE:  Rate/Rhythm: 52 SB  BP:  Supine: 116/57  Sitting:   Standing:    SaO2: 97%RA  MODE:  Ambulation: 340 ft   POST:  Rate/Rhythm: 73  BP:  Supine:   Sitting: 122/53  Standing:    SaO2: 98%RA 1300-1330 Pt walked 340 ft on RA with hand held asst x 2. C/o lightheadedness whole walk and legs became weak at end of walk. Stated he does not walk much distance at home as he will have chest tightness. To recliner with call bell. Family in room. Encouraged OOB to assist with lightheadedness and getting use to position change. No CP.   Luetta Nuttingharlene Joycie Aerts, RN BSN  03/20/2014 1:26 PM

## 2014-03-20 NOTE — Progress Notes (Signed)
INITIAL NUTRITION ASSESSMENT  Patient meets the criteria for moderate MALNUTRITION in the context of chronic illness with 5% weight loss in 1 month, PO intake <75% of estimated needs, and moderate muscle wasting.  DOCUMENTATION CODES Per approved criteria  -Non-severe (moderate) malnutrition in the context of chronic illness   INTERVENTION: -Ensure Complete BID, each provides 350 kcal, 13 g protein. -Patient encouraged to continue nutrition supplements of choice after discharge to promote good nutritional status.  NUTRITION DIAGNOSIS: Predicted suboptimal energy intake related to decreased appetite as evidenced by recent history of poor intake.   Goal: Patient will meet >/=90% of estimated nutrition needs  Monitor:  PO intake, weight, labs, I/Os  Reason for Assessment: Consult  80 y.o. male  Admitting Dx: PNA (pneumonia)  ASSESSMENT: 80 year old male with history of CAD. He had a NSTEMI in 2012. Admitted with chest pain and pneumonia unlikely secondary to aspiration. Patient with severe CAD, refusing CABG/AVR.   -Patient reports a very poor appetite for the last few days. He states that he typically has a fair appetite, but eats because he has to. He usually eats 1 meal daily in the morning, a few bites of something in the evening, and snacks on sweets most of the day. He is currently eating most of his meals here (75-100%), stating that he's not hungry, but will eat what's put in front of him.  -His family states he has lost about 5 pounds in the last month, with gradual weight loss occurring before that. Patient is open to trying nutrition supplements.  -Full nutrition physical exam not performed, but some evidence of moderate muscle wasting at the clavicle and temple.     Height: Ht Readings from Last 1 Encounters:  03/18/14 5\' 8"  (1.727 m)    Weight: Wt Readings from Last 1 Encounters:  03/20/14 139 lb 5.3 oz (63.2 kg)    Ideal Body Weight: 154 pounds  % Ideal Body  Weight: 90%  Wt Readings from Last 10 Encounters:  03/20/14 139 lb 5.3 oz (63.2 kg)  02/07/14 145 lb (65.772 kg)  01/22/14 147 lb (66.679 kg)  02/28/13 147 lb (66.679 kg)  01/26/13 150 lb 6.4 oz (68.221 kg)  01/14/13 147 lb (66.679 kg)  01/11/13 148 lb (67.132 kg)  11/26/11 156 lb (70.761 kg)  08/14/11 157 lb (71.215 kg)  05/15/11 150 lb (68.04 kg)    Usual Body Weight: 145 pounds  % Usual Body Weight: 95%  BMI:  Body mass index is 21.19 kg/(m^2). Patient is normal weight.  Estimated Nutritional Needs: Kcal: 1700-1850 kcal Protein: 75-90 g Fluid: >1.9 L/day  Skin: Intact  Diet Order: Cardiac  EDUCATION NEEDS: -No education needs identified at this time   Intake/Output Summary (Last 24 hours) at 03/20/14 1354 Last data filed at 03/20/14 1239  Gross per 24 hour  Intake  490.2 ml  Output    985 ml  Net -494.8 ml    Last BM: 7/12   Labs:   Recent Labs Lab 03/18/14 0534 03/18/14 0540 03/19/14 0400  NA 142 141 139  K 3.8 3.7 3.8  CL 103 105 102  CO2 24  --  24  BUN 18 18 17   CREATININE 1.10 1.20 1.10  CALCIUM 9.0  --  8.6  GLUCOSE 102* 101* 86    CBG (last 3)   Recent Labs  03/18/14 0631  GLUCAP 96    Scheduled Meds: . amLODipine  10 mg Oral q morning - 10a  . aspirin EC  325 mg Oral q morning - 10a  . atorvastatin  80 mg Oral q morning - 10a  . budesonide-formoterol  2 puff Inhalation BID  . clopidogrel  75 mg Oral q morning - 10a  . famotidine  20 mg Oral Daily  . guaiFENesin  5 mL Oral Q6H  . isosorbide mononitrate  30 mg Oral q morning - 10a  . levothyroxine  112 mcg Oral QAC breakfast  . metoprolol  25 mg Oral BID  . niacin  500 mg Oral q morning - 10a  . sodium chloride  3 mL Intravenous Q12H  . vitamin E  100 Units Oral q morning - 10a    Continuous Infusions:   Past Medical History  Diagnosis Date  . Coronary artery disease     LAD w/ Ca++, 100% RCA, in-astent stenosis Cx, high grade lesion Ramus  . Hypercholesteremia   .  Dyspnea   . Hypothyroid   . CVA (cerebral infarction)   . Arthritis     hips, will give out  . Varicella   . Depression     never took any medication.  . Emphysema of lung     smoking related.  . Hypertension   . Stroke     left hemiparesis-100% recovery  . AAA (abdominal aortic aneurysm)     small  . Severe aortic stenosis 2012    Past Surgical History  Procedure Laterality Date  . Coronary angioplasty with stent placement      Several stents, last in 2004    Linnell Fulling, RD, LDN Pager #: [phone] After-Hours Pager #: [phone]

## 2014-03-20 NOTE — Care Management Note (Signed)
    Page 1 of 1   03/21/2014     1:02:40 PM CARE MANAGEMENT NOTE 03/21/2014  Patient:  Cappiello,Sahib C   Account Number:  [id number]  Date Initiated:  03/20/2014  Documentation initiated by:  AMERSON,JULIE  Subjective/Objective Assessment:   Pt adm on 03/18/14 with NSTEMI, PNA, aortic stenosis.  PTA, pt lives alone, but has daughter close by.     Action/Plan:   CM consult for home health needs.  PT/OT consults pending. Will follow up after therapy consults complete, for recommendations on LOC/home needs.  Will follow up.   Anticipated DC Date:  03/22/2014   Anticipated DC Plan:  HOME W HOME HEALTH SERVICES      DC Planning Services  CM consult      Choice offered to / List presented to:          [facility]  [facility] Refused      Status of service:  Completed, signed off Medicare Important Message given?  YES (If response is "NO", the following Medicare IM given date fields will be blank) Date Medicare IM given:  03/21/2014 Medicare IM given by:  Jovie Swanner Date Additional Medicare IM given:   Additional Medicare IM given by:    Discharge Disposition:  HOME/SELF CARE  Per UR Regulation:  Reviewed for med. necessity/level of care/duration of stay  If discussed at Long Length of Stay Meetings, dates discussed:    Comments:  03/21/14 0949 Verdis PrimeHenrietta Ruth Kovich RN MSN BSN CCM Pt states his son, who is self-employed, lives with him and is in and out during the day.  [facility] there for primary care. Discussed mobility, pt states he has several canes, one that he made himself.  PT consult pending although pt states he is active and provides his own therapy, will not need a therapist.

## 2014-03-20 NOTE — Progress Notes (Signed)
ANTICOAGULATION CONSULT NOTE - Follow Up Consult  Pharmacy Consult for Heparin Indication: NSTEMI  Allergies  Allergen Reactions  . Prednisone Palpitations    See 02/07/14 he feels this caused chest pain    Labs:  Recent Labs  03/18/14 0534 03/18/14 0540 03/18/14 0913 03/18/14 1442  03/18/14 1540 03/18/14 2040 03/19/14 0400 03/20/14 0332  HGB 10.9* 11.6*  --   --   --   --   --  10.7* 11.1*  HCT 32.9* 34.0*  --   --   --   --   --  32.2* 33.4*  PLT 141*  --   --   --   --   --   --  98* 124*  LABPROT  --   --   --   --   --  15.5*  --   --   --   INR  --   --   --   --   --  1.23  --   --   --   HEPARINUNFRC  --   --   --   --   < > 0.36 0.35 0.46 0.57  CREATININE 1.10 1.20  --   --   --   --   --  1.10  --   CKTOTAL  --   --  186  --   --   --   --   --   --   CKMB  --   --  13.7*  --   --   --   --   --   --   TROPONINI  --   --  1.49* 4.84*  --   --  6.24*  --   --   < > = values in this interval not displayed.  Estimated Creatinine Clearance: 48.7 ml/min (by C-G formula based on Cr of 1.1).  Assessment: Heparin level therapeutic Awaiting plans for cath CBC stable  Goal of Therapy:  Heparin level 0.3-0.7 units/ml Monitor platelets by anticoagulation protocol: Yes   Plan:   Continue heparin at 800 units/hr.  Daily heparin level and CBC while on heparin.  Thank you Okey RegalLisa Berwyn Bigley, PharmD [phone]  03/20/2014,9:31 AM

## 2014-03-20 NOTE — Progress Notes (Signed)
Patient: Maxwell Copeland / Admit Date: 03/18/2014 / Date of Encounter: 03/20/2014, 10:45 AM   Subjective: Mild transient dizziness after using incentive spirometry that quickly resolved. Feels great today. No CP or SOB.   Objective: Telemetry: appears to be NSR EKG: admit EKG (not in EPIC) was clearly ischemic with ST depressions. Today's tracing shows SB/SA 49bpm without significant ST changes.  Physical Exam: Blood pressure 127/67, pulse 55, temperature 98.1 F (36.7 C), temperature source Oral, resp. rate 16, height 5\' 8"  (1.727 m), weight 139 lb 5.3 oz (63.2 kg), SpO2 98.00%. General: Well developed, well nourished WM in no acute distress. Jovial. Smiling, joking. Head: Normocephalic, atraumatic, sclera non-icteric, no xanthomas, nares are without discharge. Neck: Negative for carotid bruits. JVP not elevated. Lungs: Clear bilaterally to auscultation without wheezes, rales, or rhonchi. Breathing is unlabored. Heart: RRR S1. Quiet S2. 2/6 SEM RUSB. No rubs or gallops.  Abdomen: Soft, non-tender, non-distended with normoactive bowel sounds. No rebound/guarding. Extremities: No clubbing or cyanosis. No edema. Distal pedal pulses are 2+ and equal bilaterally. Neuro: Alert and oriented X 3. Moves all extremities spontaneously. Psych:  Responds to questions appropriately with a normal affect.  Intake/Output Summary (Last 24 hours) at 03/20/14 1045 Last data filed at 03/20/14 0914  Gross per 24 hour  Intake    349 ml  Output   1185 ml  Net   -836 ml    Inpatient Medications:  . amLODipine  10 mg Oral q morning - 10a  . aspirin EC  325 mg Oral q morning - 10a  . atorvastatin  80 mg Oral q morning - 10a  . budesonide-formoterol  2 puff Inhalation BID  . clopidogrel  75 mg Oral q morning - 10a  . famotidine  20 mg Oral Daily  . guaiFENesin  5 mL Oral Q6H  . isosorbide mononitrate  30 mg Oral q morning - 10a  . levothyroxine  112 mcg Oral QAC breakfast  . metoprolol  37.5 mg Oral  BID  . niacin  500 mg Oral q morning - 10a  . sodium chloride  3 mL Intravenous Q12H  . vitamin E  100 Units Oral q morning - 10a   Infusions:  . heparin 800 Units/hr (03/19/14 1616)    Labs:  Recent Labs  03/18/14 0534 03/18/14 0540 03/19/14 0400  NA 142 141 139  K 3.8 3.7 3.8  CL 103 105 102  CO2 24  --  24  GLUCOSE 102* 101* 86  BUN 18 18 17   CREATININE 1.10 1.20 1.10  CALCIUM 9.0  --  8.6    Recent Labs  03/18/14 0534 03/19/14 0400  AST 31 48*  ALT 21 20  ALKPHOS 84 72  BILITOT 0.5 0.4  PROT 7.1 6.4  ALBUMIN 3.8 3.3*    Recent Labs  03/18/14 0534  03/19/14 0400 03/20/14 0332  WBC 5.1  --  2.9* 3.5*  NEUTROABS 4.0  --   --   --   HGB 10.9*  < > 10.7* 11.1*  HCT 32.9*  < > 32.2* 33.4*  MCV 88.7  --  89.7 87.9  PLT 141*  --  98* 124*  < > = values in this interval not displayed.  Recent Labs  03/18/14 0913 03/18/14 1442 03/18/14 2040  CKTOTAL 186  --   --   CKMB 13.7*  --   --   TROPONINI 1.49* 4.84* 6.24*   No components found with this basename: POCBNP,  No results found for this  basename: HGBA1C,  in the last 72 hours   Radiology/Studies:  Dg Chest 2 View  03/19/2014   CLINICAL DATA:  History chest pain.  Bronchial debris.  EXAM: CHEST  2 VIEW  COMPARISON:  CT 03/18/2014.  Chest x-ray 03/18/2014.  FINDINGS: Mediastinum and hilar structures are normal. The lungs are clear of acute infiltrates. No pleural effusion or pneumothorax. Heart size normal. Coronary stents. No pleural effusion or pneumothorax. No acute osseus abnormality.  IMPRESSION: 1. Large airways are widely patent.  No focal infiltrate. 2. Coronary artery stents.   Electronically Signed   By: Maisie Fus  Register   On: 03/19/2014 09:26   Ct Angio Chest Pe W/cm &/or Wo Cm  03/18/2014   CLINICAL DATA:  Chest pain, epigastric pain, sinus drainage.  EXAM: CT ANGIOGRAPHY CHEST WITH CONTRAST  TECHNIQUE: Multidetector CT imaging of the chest was performed using the standard protocol during  bolus administration of intravenous contrast. Multiplanar CT image reconstructions and MIPs were obtained to evaluate the vascular anatomy.  CONTRAST:  70mL OMNIPAQUE IOHEXOL 350 MG/ML SOLN  COMPARISON:  Chest radiograph Jan 16, 2014  FINDINGS: Adequate pulmonary arterial opacification. Main pulmonary artery is not enlarged. No pulmonary arterial filling defects to the level the sub segmental branches.  Heart size is normal, mild pericardial thickening versus effusion. Coronary artery stents suspected. Thoracic aorta is normal in course and caliber with moderate to severe calcific atherosclerosis. No lymphadenopathy by CT size criteria.  Moderate centrilobular emphysema, trace left pleural effusion. Lingular atelectasis versus scarring. Dependent atelectasis with minimal tree-in-bud infiltrate in right greater than left lung bases. Nonocclusive soft tissue within the distal trachea, extending of the right mainstem bronchus suggesting aspiration with mild bronchial wall thickening. No pneumothorax.  Ectatic infrarenal aorta to 2.9 cm with intimal hematoma, calcific atherosclerosis. Osteopenia. No acute osseous process.  Review of the MIP images confirms the above findings.  IMPRESSION: No acute pulmonary embolism.  Nonocclusive debris within the distal trachea extending to the right mainstem bronchus suggesting aspirate.  Mild bronchial wall thickening/bronchitis. Emphysema with minimal bibasilar tree-in-bud infiltrate which may be infectious or inflammatory. Trace left pleural effusion.   Electronically Signed   By: Awilda Metro   On: 03/18/2014 06:32   Dg Chest Port 1 View  03/18/2014   CLINICAL DATA:  Chest pain.  EXAM: PORTABLE CHEST - 1 VIEW  COMPARISON:  Chest radiograph Jan 22, 2014  FINDINGS: Cardiac silhouette is unremarkable and unchanged. Moderately calcified aortic knob. Similar mild chronic interstitial changes, increased lung volumes. Strandy densities in the lung bases. No pneumothorax.  Soft  tissue planes and included osseous structures are nonsuspicious, mild degenerative change of the thoracic spine.  IMPRESSION: COPD with bibasilar strandy densities likely reflecting atelectasis.   Electronically Signed   By: Awilda Metro   On: 03/18/2014 05:44     Assessment and Plan  1. Severe CAD (felt poor candidate for CABG/AVR 2012 and patient refused, treated medically), admitted with NSTEMI 2. Severe aortic stenosis, not felt to be TAVR candidate given concomitant CAD, also with mild-mod AI/mild MR 3. Pneumonia, possibly aspiration - TMax 101.5 on admission 4. Pancytopenia 5. Abnormal TSH 6. HTN, controlled 7. Sinus bradycardia   Had a long discussion with patient and daughter given the back-and-forth about pursuing cath. We discussed his comorbidities (severe CAD, severe AS) and options for care, i.e. cardiac cath (which could potentially identify a culprit, but would not address AS and other severe CAD), continue medical management, possibly eventually palliative care. He feels very  strongly about wanting to try and ambulate, then going home, to return if symptoms recur. He understands risks of reinfarction or death as a complication of this option. He is not sure that cardiac cath will actually fix him given diffuse disease and I think this is a reasonable concern. He continues to adamantly refuse CABG/AVR and has not been felt to be a candidate for TAVR given concomitant significant CAD. Per disussion with MD, pt, and daughter, will continue to medically manage for now.    Plan:  - Stop IV heparin  - Ambulate with cardiac rehab and PT - Care management see for home health needs - Dietician consult to assess nutritional status - Decrease metoprolol to 25mg  BID given softer BP's and HR 40's at times this AM (asymptomatic) - Add on TSH and free T4 - No longer febrile (tmax 101.5 on admission) - will defer antibiotic treatment to IM - DC in AM if stable  Signed, Ronie Spies  PA-C The patient was seen with Lucile Crater PA-C.  Agree with her assessment and plan.  The patient relayed to me that open-heart surgery or aortic valve replacement is not an option for him.  He does not want to consider it.  We will continue medical management.  Anticipate possibly home on Tuesday, July 14 if he does well with ambulation today off heparin.

## 2014-03-21 LAB — CBC
HEMATOCRIT: 32.4 % — AB (ref 39.0–52.0)
Hemoglobin: 10.7 g/dL — ABNORMAL LOW (ref 13.0–17.0)
MCH: 29 pg (ref 26.0–34.0)
MCHC: 33 g/dL (ref 30.0–36.0)
MCV: 87.8 fL (ref 78.0–100.0)
Platelets: 124 10*3/uL — ABNORMAL LOW (ref 150–400)
RBC: 3.69 MIL/uL — ABNORMAL LOW (ref 4.22–5.81)
RDW: 13.4 % (ref 11.5–15.5)
WBC: 4.2 10*3/uL (ref 4.0–10.5)

## 2014-03-21 MED ORDER — METOPROLOL TARTRATE 50 MG PO TABS: 25.0000 mg | ORAL_TABLET | Freq: Two times a day (BID) | ORAL | Status: AC

## 2014-03-21 MED ORDER — ACETAMINOPHEN 325 MG PO TABS
650.0000 mg | ORAL_TABLET | ORAL | Status: AC | PRN
Start: 2014-03-21 — End: ?

## 2014-03-21 MED ORDER — ASPIRIN 81 MG PO CHEW
81.0000 mg | CHEWABLE_TABLET | Freq: Every day | ORAL | Status: DC
  Administered 2014-03-21: 81 mg via ORAL
  Filled 2014-03-21: qty 1

## 2014-03-21 MED ORDER — ENSURE COMPLETE PO LIQD: 237.0000 mL | Freq: Two times a day (BID) | ORAL | Status: DC

## 2014-03-21 MED ORDER — ASPIRIN 81 MG PO CHEW: 81.0000 mg | CHEWABLE_TABLET | Freq: Every day | ORAL | Status: DC

## 2014-03-21 NOTE — Progress Notes (Signed)
    Subjective:  No chest pain, up yesterday without problems.  Objective:  Vital Signs in the last 24 hours: Temp:  [97.8 F (36.6 C)-98.6 F (37 C)] 98 F (36.7 C) (07/14 0447) Pulse Rate:  [52-77] 61 (07/13 2051) Resp:  [14-20] 20 (07/13 2051) BP: (102-145)/(54-68) 123/57 mmHg (07/14 0447) SpO2:  [96 %-100 %] 99 % (07/13 2051) Weight:  [138 lb 14.2 oz (63 kg)] 138 lb 14.2 oz (63 kg) (07/14 0447)  Intake/Output from previous day:  Intake/Output Summary (Last 24 hours) at 03/21/14 0747 Last data filed at 03/20/14 2250  Gross per 24 hour  Intake  634.2 ml  Output    600 ml  Net   34.2 ml    Physical Exam: General appearance: alert, cooperative and no distress Lungs: clear to auscultation bilaterally Heart: regular rate and rhythm and 2/6 systolic murmur   Rate: 45-60  Rhythm: appears to be junctional, check 12 lead  Lab Results:  Recent Labs  03/20/14 0332 03/21/14 0338  WBC 3.5* 4.2  HGB 11.1* 10.7*  PLT 124* 124*    Recent Labs  03/19/14 0400  NA 139  K 3.8  CL 102  CO2 24  GLUCOSE 86  BUN 17  CREATININE 1.10    Recent Labs  03/18/14 1442 03/18/14 2040  TROPONINI 4.84* 6.24*    Recent Labs  03/18/14 1540  INR 1.23    Imaging: Imaging results have been reviewed  Cardiac Studies:  2D 03/18/14 Impressions:  - LVEF 55-60%, normal wall thickness, calcified aortic valve with severe stenosis, AVA 0.6-0.7 cm2, mild to moderate AI, diastolic dysfunction. dilated LA   Assessment/Plan:   Principal Problem:   PNA (pneumonia), Unlikely secondary to aspiration Active Problems:   HYPOTHYROIDISM   HYPERCHOLESTEROLEMIA  IIA   NSTEMI (non-ST elevated myocardial infarction)   Severe aortic stenosis   Malnutrition of moderate degree    PLAN: Check 12 lead, we may have to decrease beta blocker. Decrease ASA to 81 mg daily as he is on Plavix. Home later today if he ambulates without problems.  Corine ShelterLuke Kilroy PA-C Beeper [phone] 03/21/2014,  7:47 AM   12 Lead shows NSR- i believe he did have transient junctional rhythm on telemetry during the night.   Patient feels well this am.  No chest pain or dyspnea.  O2 saturations 97% on room air. Rhythm stable NSR with mild transient junctional rhythm during the night. Okay for discharge today.

## 2014-03-21 NOTE — Discharge Instructions (Signed)
Angina Pectoris °Angina pectoris, often just called angina, is extreme discomfort in your chest, neck, or arm caused by a lack of blood in the middle and thickest layer of your heart wall (myocardium). It may feel like tightness or heavy pressure. It may feel like a crushing or squeezing pain. Some people say it feels like gas or indigestion. It may go down your shoulders, back, and arms. Some people may have symptoms other than pain. These symptoms include fatigue, shortness of breath, cold sweats, or nausea. There are four different types of angina: °· Stable angina--Stable angina usually occurs in episodes of predictable frequency and duration. It usually is brought on by physical activity, emotional stress, or excitement. These are all times when the myocardium needs more oxygen. Stable angina usually lasts a few minutes and often is relieved by taking a medicine that can be taken under your tongue (sublingually). The medicine is called nitroglycerin. Stable angina is caused by a buildup of plaque inside the arteries, which restricts blood flow to the heart muscle (atherosclerosis). °· Unstable angina--Unstable angina can occur even when your body experiences little or no physical exertion. It can occur during sleep. It can also occur at rest. It can suddenly increase in severity or frequency. It might not be relieved by sublingual nitroglycerin. It can last up to 30 minutes. The most common cause of unstable angina is a blood clot that has developed on the top of plaque buildup inside a coronary artery. It can lead to a heart attack if the blood clot completely blocks the artery. °· Microvascular angina--This type of angina is caused by a disorder of tiny blood vessels called arterioles. Microvascular angina is more common in women. The pain may be more severe and last longer than other types of angina pectoris. °· Prinzmetal or variant angina--This type of angina pectoris usually occurs when your body  experiences little or no physical exertion. It especially occurs in the early morning hours. It is caused by a spasm of your coronary artery. °HOME CARE INSTRUCTIONS  °· Only take over-the-counter and prescription medicines as directed by your health care provider. °· Stay active or increase your exercise as directed by your health care provider. °· Limit strenuous activity as directed by your health care provider. °· Limit heavy lifting as directed by your health care provider. °· Maintain a healthy weight. °· Learn about and eat heart-healthy foods. °· Do not use any tobacco products including cigarettes, chewing tobacco or electronic cigarettes. °SEEK IMMEDIATE MEDICAL CARE IF:  °You experience the following symptoms: °· Chest, neck, deep shoulder, or arm pain or discomfort that lasts more than a few minutes. °· Chest, neck, deep shoulder, or arm pain or discomfort that goes away and comes back, repeatedly. °· Heavy sweating with discomfort, without a noticeable cause. °· Shortness of breath or difficulty breathing. °· Angina that does not get better after a few minutes of rest or after taking sublingual nitroglycerin. °These can all be symptoms of a heart attack, which is a medical emergency! Get medical help at once. Call your local emergency service (911 in [place] immediately. Do not  drive yourself to the hospital and do not  wait to for your symptoms to go away. °MAKE SURE YOU: °· Understand these instructions. °· Will watch your condition. °· Will get help right away if you are not doing well or get worse. °Document Released: 08/25/2005 Document Revised: 08/30/2013 Document Reviewed: 06/03/2012 °[employer]® Patient Information ©2015 [employer]. This information is not intended   to replace advice given to you by your health care provider. Make sure you discuss any questions you have with your health care provider. ° °

## 2014-03-21 NOTE — Discharge Summary (Signed)
Patient ID: Maxwell ShipperRichard C Obeirne,  MRN: [id number], DOB/AGE: 78/03/1932 78 y.o.  Admit date: 03/18/2014 Discharge date: 03/21/2014  Primary Care Provider: Illene RegulusMichael Norins, MD Primary Cardiologist: Dr Eden EmmsNishan  Discharge Diagnoses Principal Problem:   PNA (pneumonia), secondary to aspiration Active Problems:   NSTEMI (non-ST elevated myocardial infarction)   Severe aortic stenosis   HYPOTHYROIDISM   HYPERCHOLESTEROLEMIA  IIA   Malnutrition of moderate degree    Hospital Course:   80 yr old male who has not followed up with cardiology since 08/2011 who presented with chest pain on 03/18/14. There was concern he had aspiration pneumonia by history and by chest CT. He has a history of CAD and previous cath in 2012 showed severe CAD and AS and at that time he declined CABG/AVR. He was placed on Heparin and antibiotics. His Troponin peaked at 6.24. He declined cath. He is not interested in CABG or AVR. He is not a candidate for TAVR secondary to severe CAD. His medications were adjusted and he was monitored on telemetry. He did have transient asymptomatic junctional rhythm but is in NSR at discharge. Dr Patty SermonsBrackbill saw him on the morning of the 14th and feels he can be discharged.    Discharge Vitals:  Blood pressure 132/54, pulse 66, temperature 97.6 F (36.4 C), temperature source Oral, resp. rate 20, height 5\' 8"  (1.727 m), weight 138 lb 14.2 oz (63 kg), SpO2 99.00%.    Labs: Results for orders placed during the hospital encounter of 03/18/14 (from the past 24 hour(s))  TSH     Status: None   Collection Time    03/20/14 12:30 PM      Result Value Ref Range   TSH 0.499  0.350 - 4.500 uIU/mL  T4, FREE     Status: None   Collection Time    03/20/14  1:14 PM      Result Value Ref Range   Free T4 1.04  0.80 - 1.80 ng/dL  CBC     Status: Abnormal   Collection Time    03/21/14  3:38 AM      Result Value Ref Range   WBC 4.2  4.0 - 10.5 K/uL   RBC 3.69 (*) 4.22 - 5.81 MIL/uL   Hemoglobin 10.7 (*) 13.0 - 17.0 g/dL   HCT 91.432.4 (*) 78.239.0 - 95.652.0 %   MCV 87.8  78.0 - 100.0 fL   MCH 29.0  26.0 - 34.0 pg   MCHC 33.0  30.0 - 36.0 g/dL   RDW 21.313.4  08.611.5 - 57.815.5 %   Platelets 124 (*) 150 - 400 K/uL    Disposition:      Follow-up Information   Follow up with Charlton HawsPeter Nishan, MD. (office will call you)    Specialty:  Cardiology   Contact information:   [address] [place] [address] [phone]       Discharge Medications:    Medication List    STOP taking these medications       aspirin EC 325 MG tablet  Replaced by:  aspirin 81 MG chewable tablet      TAKE these medications       acetaminophen 325 MG tablet  Commonly known as:  TYLENOL  Take 2 tablets (650 mg total) by mouth every 4 (four) hours as needed for mild pain, fever or headache.     amLODipine 10 MG tablet  Commonly known as:  NORVASC  Take 10 mg by mouth every morning.  aspirin 81 MG chewable tablet  Chew 1 tablet (81 mg total) by mouth daily.     atorvastatin 80 MG tablet  Commonly known as:  LIPITOR  Take 80 mg by mouth every morning.     budesonide-formoterol 160-4.5 MCG/ACT inhaler  Commonly known as:  SYMBICORT  Inhale 2 puffs into the lungs 2 (two) times daily as needed (for shortness of breath).     clopidogrel 75 MG tablet  Commonly known as:  PLAVIX  Take 75 mg by mouth every morning.     feeding supplement (ENSURE COMPLETE) Liqd  Take 237 mLs by mouth 2 (two) times daily between meals.     isosorbide mononitrate 30 MG 24 hr tablet  Commonly known as:  IMDUR  Take 30 mg by mouth every morning.     levothyroxine 112 MCG tablet  Commonly known as:  SYNTHROID, LEVOTHROID  Take 112 mcg by mouth daily before breakfast.     metoprolol 50 MG tablet  Commonly known as:  LOPRESSOR  Take 0.5 tablets (25 mg total) by mouth 2 (two) times daily.     niacin 500 MG tablet  Commonly known as:  SLO-NIACIN  Take 500 mg by mouth every morning.      nitroGLYCERIN 0.4 MG SL tablet  Commonly known as:  NITROSTAT  Place 0.4 mg under the tongue every 5 (five) minutes as needed for chest pain.     ranitidine 150 MG tablet  Commonly known as:  ZANTAC  Take 150 mg by mouth every morning.     vitamin E 100 UNIT capsule  Take 100 Units by mouth every morning.         Duration of Discharge Encounter: Greater than 30 minutes including physician time.  Jolene Provost PA-C 03/21/2014 10:02 AM

## 2014-03-21 NOTE — Progress Notes (Signed)
[id number] Cardiac Rehab Completed MI education with pt and daughter. They voice understanding. Pt is not physically able to do Outpt. CRP, he is limited by angina symptoms. I encouraged pt to be as active as he was able to be and to try to do some walking daily. I gave pt MI booklet and we reviewed proper use of sl NTG. Beatrix FettersHughes, Teralyn Mullins G, RN 03/21/2014 11:44 AM

## 2014-03-21 NOTE — Evaluation (Signed)
Physical Therapy Evaluation Patient Details Name: YEDIDYA DUDDY MRN: [id number] DOB: 05/13/1932 Today's Date: 03/21/2014   History of Present Illness  Pt is a 80 y/o male admitted s/p NSTEMI.   Clinical Impression  Patient evaluated by Physical Therapy with no further acute PT needs identified. All education has been completed and the patient has no further questions. Discussed benefits of HHPT as pt states he cannot manage outpatient. Pt refusing even one session with PT after d/c, stating he won't be tied down to appointments. Understands that official PT recommendation is for continued therapy for balance and strengthening at d/c. See below for any follow-up Physial Therapy or equipment needs. PT is signing off. Thank you for this referral.     Follow Up Recommendations HHPT    Equipment Recommendations  None recommended by PT    Recommendations for Other Services       Precautions / Restrictions Precautions Precautions: Fall Restrictions Weight Bearing Restrictions: No      Mobility  Bed Mobility Overal bed mobility: Modified Independent             General bed mobility comments: Increased time and use of bed rails to transition to/from EOB.   Transfers Overall transfer level: Modified independent Equipment used: None             General transfer comment: Able to power-up to full standing without assist and with no unsteadiness noted.   Ambulation/Gait Ambulation/Gait assistance: Supervision Ambulation Distance (Feet): 400 Feet Assistive device: None Gait Pattern/deviations: WFL(Within Functional Limits) Gait velocity: Decreased Gait velocity interpretation: Below normal speed for age/gender General Gait Details: Occasional unsteadiness noted, especially with turns, however pt able to recover on his own without assist from PT. After ~400 feet, pt reports slight fatigue in LE's.   Stairs            Wheelchair Mobility    Modified Rankin  (Stroke Patients Only)       Balance                                 Standardized Balance Assessment Standardized Balance Assessment : Berg Balance Test Berg Balance Test Sit to Stand: Able to stand without using hands and stabilize independently Standing Unsupported: Able to stand safely 2 minutes Sitting with Back Unsupported but Feet Supported on Floor or Stool: Able to sit safely and securely 2 minutes Stand to Sit: Sits safely with minimal use of hands Transfers: Able to transfer safely, minor use of hands Standing Unsupported with Eyes Closed: Able to stand 10 seconds safely Standing Ubsupported with Feet Together: Able to place feet together independently and stand 1 minute safely From Standing, Reach Forward with Outstretched Arm: Can reach forward >12 cm safely (5") From Standing Position, Pick up Object from Floor: Able to pick up shoe, needs supervision From Standing Position, Turn to Look Behind Over each Shoulder: Looks behind one side only/other side shows less weight shift Turn 360 Degrees: Able to turn 360 degrees safely in 4 seconds or less Standing Unsupported, Alternately Place Feet on Step/Stool: Able to stand independently and complete 8 steps >20 seconds Standing Unsupported, One Foot in Front: Loses balance while stepping or standing Standing on One Leg: Tries to lift leg/unable to hold 3 seconds but remains standing independently Total Score: 45         Pertinent Vitals/Pain Vitals stable throughout session.     Home Living Family/patient expects  to be discharged to:: Private residence Living Arrangements: Children (Son, daughter next door) Available Help at Discharge: Family;Available PRN/intermittently Type of Home: House Home Access: Stairs to enter Entrance Stairs-Rails: None Entrance Stairs-Number of Steps: 2 Home Layout: One level Home Equipment: Cane - single point      Prior Function Level of Independence: Independent          Comments: Still driving, sometimes uses the motorized cart at the store     Hand Dominance   Dominant Hand: Right    Extremity/Trunk Assessment   Upper Extremity Assessment: Defer to OT evaluation           Lower Extremity Assessment: Overall WFL for tasks assessed      Cervical / Trunk Assessment: Kyphotic (Forward head posture)  Communication   Communication: HOH  Cognition Arousal/Alertness: Awake/alert Behavior During Therapy: WFL for tasks assessed/performed Overall Cognitive Status: Within Functional Limits for tasks assessed                      General Comments      Exercises        Assessment/Plan    PT Assessment Patent does not need any further PT services  PT Diagnosis     PT Problem List    PT Treatment Interventions     PT Goals (Current goals can be found in the Care Plan section) Acute Rehab PT Goals PT Goal Formulation: No goals set, d/c therapy    Frequency     Barriers to discharge        Co-evaluation               End of Session Equipment Utilized During Treatment: Gait belt Activity Tolerance: Patient tolerated treatment well Patient left: in bed;with call bell/phone within reach;with family/visitor present Nurse Communication: Mobility status         Time: [id number] PT Time Calculation (min): 28 min   Charges:   PT Evaluation $Initial PT Evaluation Tier I: 1 Procedure PT Treatments $Neuromuscular Re-education: 8-22 mins   PT G Codes:          Ruthann CancerHamilton, Dimitrious Micciche 03/21/2014, 1:36 PM  Ruthann CancerLaura Hamilton, PT, DPT Acute Rehabilitation Services Pager: [phone]

## 2014-03-24 LAB — CULTURE, BLOOD (ROUTINE X 2)
CULTURE: NO GROWTH
CULTURE: NO GROWTH

## 2014-03-27 ENCOUNTER — Ambulatory Visit: Payer: Medicare Other | Admitting: Internal Medicine

## 2014-03-27 DIAGNOSIS — Z0289 Encounter for other administrative examinations: Secondary | ICD-10-CM

## 2014-03-27 DIAGNOSIS — R4689 Other symptoms and signs involving appearance and behavior: Secondary | ICD-10-CM | POA: Insufficient documentation

## 2014-03-29 ENCOUNTER — Telehealth: Payer: Self-pay | Admitting: Internal Medicine

## 2014-03-29 NOTE — Telephone Encounter (Signed)
If he calls back; please do not schedule him with me unless there was legitimate reason for No Show ( Ex readmitted)

## 2014-03-29 NOTE — Telephone Encounter (Signed)
Patient no showed for hospital follow up on 03/27/2014.  Please advise.

## 2014-04-11 ENCOUNTER — Encounter: Payer: Self-pay | Admitting: Physician Assistant

## 2014-04-11 ENCOUNTER — Ambulatory Visit (INDEPENDENT_AMBULATORY_CARE_PROVIDER_SITE_OTHER): Payer: Medicare Other | Admitting: Physician Assistant

## 2014-04-11 VITALS — BP 138/70 | HR 63 | Ht 68.0 in | Wt 146.0 lb

## 2014-04-11 DIAGNOSIS — I6529 Occlusion and stenosis of unspecified carotid artery: Secondary | ICD-10-CM

## 2014-04-11 DIAGNOSIS — I10 Essential (primary) hypertension: Secondary | ICD-10-CM

## 2014-04-11 DIAGNOSIS — I6523 Occlusion and stenosis of bilateral carotid arteries: Secondary | ICD-10-CM

## 2014-04-11 DIAGNOSIS — I350 Nonrheumatic aortic (valve) stenosis: Secondary | ICD-10-CM

## 2014-04-11 DIAGNOSIS — I359 Nonrheumatic aortic valve disorder, unspecified: Secondary | ICD-10-CM

## 2014-04-11 DIAGNOSIS — I2510 Atherosclerotic heart disease of native coronary artery without angina pectoris: Secondary | ICD-10-CM

## 2014-04-11 DIAGNOSIS — E7800 Pure hypercholesterolemia, unspecified: Secondary | ICD-10-CM

## 2014-04-11 DIAGNOSIS — I658 Occlusion and stenosis of other precerebral arteries: Secondary | ICD-10-CM

## 2014-04-11 NOTE — Progress Notes (Signed)
Cardiology Office Note    Date:  04/11/2014   ID:  Maxwell Copeland Aiello, DOB 02/21/1932, MRN [id number]  PCP:  Illene RegulusMichael Norins, MD  Cardiologist:  Dr. Charlton HawsPeter Nishan     History of Present Illness: Maxwell Copeland [place] is a 80 y.o. male with a history of CAD status post non-STEMI in 2012, severe aortic stenosis, HL, prior stroke, HTN. Patient has refused CABG/AVR in the past.  He was admitted 7/11-7/14 with a non-STEMI. Chest CT was also concerning for aspiration pneumonia. He was seen by the internal medicine service. Findings on chest CT were felt to likely be from mucous plugging more than aspiration. He was continued on antibiotics. Long discussions were held with the patient regarding surgical intervention. He was not interested in CABG or aortic valve replacement. He was not felt to be a candidate for TAVR secondary to severe CAD. Continued medical therapy has been recommended.    Patient returns for followup with his daughter. He denies chest discomfort or significant worsening dyspnea. He is NYHA 2b-3.  He denies orthopnea, PND or edema. He denies syncope. He continues to have a cough with clear sputum production. He denies hemoptysis or fevers.   Studies:  - LHC (8/12):  Proximal/mid LAD 75-80%, proximal D1 75-80%, OM subtotal occlusion, proximal and mid circumflex stents patent with 40-50% between stents, distal circumflex 70%, mid RCA occluded, right to right and left-to-right collaterals  - Echo (7/15):  EF 55-60%, normal wall motion, grade 1 diastolic dysfunction, severe aortic stenosis (mean 35-40 mm Hg), mild to moderate AI, MAC, mild MR, mild LAE  - Carotid US (9/12):  RICA 60-79%; LICA 40-59%-followup 6 months   Recent Labs/Images: 03/18/2014: Pro B Natriuretic peptide (BNP) 1374.0*  03/19/2014: ALT 20; Creatinine 1.10; HDL Cholesterol by NMR 41; LDL (calc) 52; Potassium 3.8  03/20/2014: TSH 0.499  03/21/2014: Hemoglobin 10.7*   Dg Chest 2 View  03/19/2014    IMPRESSION: 1.  Large airways are widely patent.  No focal infiltrate. 2. Coronary artery stents.   Electronically Signed   By: Maisie Fushomas  Register   On: 03/19/2014 09:26   Ct Angio Chest Pe W/cm &/or Wo Cm  03/18/2014    IMPRESSION: No acute pulmonary embolism.  Nonocclusive debris within the distal trachea extending to the right mainstem bronchus suggesting aspirate.  Mild bronchial wall thickening/bronchitis. Emphysema with minimal bibasilar tree-in-bud infiltrate which may be infectious or inflammatory. Trace left pleural effusion.   Electronically Signed   By: Awilda Metroourtnay  Bloomer   On: 03/18/2014 06:32    Wt Readings from Last 3 Encounters:  04/11/14 146 lb (66.225 kg)  03/21/14 138 lb 14.2 oz (63 kg)  02/07/14 145 lb (65.772 kg)     Past Medical History  Diagnosis Date  . Coronary artery disease     LAD w/ Ca++, 100% RCA, in-astent stenosis Cx, high grade lesion Ramus  . Hypercholesteremia   . Dyspnea   . Hypothyroid   . CVA (cerebral infarction)   . Arthritis     hips, will give out  . Varicella   . Depression     never took any medication.  . Emphysema of lung     smoking related.  . Hypertension   . Stroke     left hemiparesis-100% recovery  . AAA (abdominal aortic aneurysm)     small  . Severe aortic stenosis 2012    Current Outpatient Prescriptions  Medication Sig Dispense Refill  . acetaminophen (TYLENOL) 325 MG tablet Take 2 tablets (  650 mg total) by mouth every 4 (four) hours as needed for mild pain, fever or headache.      Marland Kitchen amLODipine (NORVASC) 10 MG tablet Take 10 mg by mouth every morning.       Marland Kitchen aspirin 81 MG chewable tablet Chew 1 tablet (81 mg total) by mouth daily.      Marland Kitchen atorvastatin (LIPITOR) 80 MG tablet Take 80 mg by mouth every morning.      . budesonide-formoterol (SYMBICORT) 160-4.5 MCG/ACT inhaler Inhale 2 puffs into the lungs 2 (two) times daily as needed (for shortness of breath).      . clopidogrel (PLAVIX) 75 MG tablet Take 75 mg by mouth every morning.       .  feeding supplement, ENSURE COMPLETE, (ENSURE COMPLETE) LIQD Take 237 mLs by mouth 2 (two) times daily between meals.      . isosorbide mononitrate (IMDUR) 30 MG 24 hr tablet Take 30 mg by mouth every morning.      Marland Kitchen levothyroxine (SYNTHROID, LEVOTHROID) 112 MCG tablet Take 112 mcg by mouth daily before breakfast.       . metoprolol (LOPRESSOR) 50 MG tablet Take 0.5 tablets (25 mg total) by mouth 2 (two) times daily.      . niacin (SLO-NIACIN) 500 MG tablet Take 500 mg by mouth every morning.       . nitroGLYCERIN (NITROSTAT) 0.4 MG SL tablet Place 0.4 mg under the tongue every 5 (five) minutes as needed for chest pain.      . ranitidine (ZANTAC) 150 MG tablet Take 150 mg by mouth every morning.       . vitamin E 100 UNIT capsule Take 100 Units by mouth every morning.        No current facility-administered medications for this visit.     Allergies:   Prednisone   Social History:  The patient  reports that he quit smoking about 26 years ago. He uses smokeless tobacco. He reports that he does not drink alcohol or use illicit drugs.   Family History:  The patient's family history includes Alcohol abuse in his brother; COPD in his brother; Coronary artery disease in an other family member; Heart attack in his mother; Heart disease in his brother, brother, and mother; Hyperlipidemia in his brother and mother; Hypertension in his mother.   ROS:  Please see the history of present illness.      All other systems reviewed and negative.   PHYSICAL EXAM: VS:  BP 138/70  Pulse 63  Ht 5\' 8"  (1.727 m)  Wt 146 lb (66.225 kg)  BMI 22.20 kg/m2 Well nourished, well developed, in no acute distress HEENT: normal Neck: no JVD Cardiac:  normal S1, diminished to absent S2; RRR; harsh 2/6 systolic murmurat the RUSB Lungs:  clear to auscultation bilaterally, no wheezing, rhonchi or rales Abd: soft, nontender, no hepatomegaly Ext: no edema Skin: warm and dry Neuro:  CNs 2-12 intact, no focal abnormalities  noted  EKG:  NSR, HR 63, normal axis, inferior Q waves with inferior lateral T-wave inversions, no change from prior tracings     ASSESSMENT AND PLAN:  Severe aortic stenosis:  I reviewed the progression of aortic stenosis again with the patient and his daughter today. He continues to refuse surgical intervention. I recommended that he establish with hospice should he develop symptoms of heart failure or syncope.  3 Vessel CAD:  He refuses CABG. Continue medical therapy with aspirin, statin, Plavix, amlodipine, nitrates, beta blocker.  Essential hypertension:  Controlled.   HYPERCHOLESTEROLEMIA:  Continue statin.  Carotid stenosis, bilateral:  Patient notes that he would refuse surgery if he had critical carotid stenosis. Therefore, I will not arrange follow up carotid Dopplers.  Cough:  There was a question of possible aspiration in the hospital.  I will arrange f/u with PCP.   Disposition:  F/u with Dr. Charlton Haws in 4 mos.   Signed, Brynda Rim, MHS 04/11/2014 3:20 PM    [facility] [address] [place], [place], [place]  [address] Phone: [phone]; Fax: [phone]

## 2014-04-11 NOTE — Patient Instructions (Addendum)
Your physician wants you to follow-up in: 4 MONTHS WITH DR. Eden EmmsNISHAN. You will receive a reminder letter in the mail two months in advance. If you don't receive a letter, please call our office to schedule the follow-up appointment.   Your physician recommends that you continue on your current medications as directed. Please refer to the Current Medication list given to you today.  PLEASE CALL PRIMARY CARE TO HAVE YOUR APPT MOVED UP SOONER DUE TO ASPIRATION PROBLEMS PER SCOTT WEAVER, PAC

## 2014-04-13 ENCOUNTER — Encounter: Payer: Self-pay | Admitting: Internal Medicine

## 2014-04-13 ENCOUNTER — Ambulatory Visit (INDEPENDENT_AMBULATORY_CARE_PROVIDER_SITE_OTHER): Payer: Medicare Other | Admitting: Internal Medicine

## 2014-04-13 VITALS — BP 113/54 | HR 69 | Temp 97.0°F | Wt 146.0 lb

## 2014-04-13 DIAGNOSIS — I214 Non-ST elevation (NSTEMI) myocardial infarction: Secondary | ICD-10-CM

## 2014-04-13 DIAGNOSIS — J690 Pneumonitis due to inhalation of food and vomit: Secondary | ICD-10-CM

## 2014-04-13 MED ORDER — NITROGLYCERIN 0.4 MG SL SUBL: 0.4000 mg | SUBLINGUAL_TABLET | SUBLINGUAL | Status: AC | PRN

## 2014-04-13 NOTE — Progress Notes (Signed)
Pre visit review using our clinic review tool, if applicable. No additional management support is needed unless otherwise documented below in the visit note. 

## 2014-04-13 NOTE — Patient Instructions (Addendum)
The Speech Therapy referral will be scheduled and you'll be notified of the time.  Please review the information we discussed about the benefits & harms of resuscitation interventions in your care. This information was provided so you direct the scope of your care based on the concept of "informed consent" . A Health Care Power of Attorney and Living Will are recommended to guarantee your ultimate health care decisions are followed. This process is an attempt to provide you with appropriate, beneficial health care interventions; it is not to deny you care. We can discuss this further if you have additional questions after a period of prayerful thought and considration

## 2014-04-13 NOTE — Progress Notes (Signed)
   Subjective:    Patient ID: Maxwell Copeland, male    DOB: 07/10/1932, 80 y.o.   MRN: [id number]  HPI  He is a former patient of Dr. Debby BudNorins who  presents for post hospital followup.  He was hospitalized 7/11-7/14/15 with chest pain. He proved to have a non STEMI complicated by aspiration pneumonia.  Cath in 2012 catheterization revealed severe coronary disease and aortic stenosis. At that time he declined CABG.  With this event his troponin peaked at 6.24. He declined re catheterization or any other intervention. Significant lab abnormalities included an albumin 3.3; AST 48; hemoglobin 10.7/hematocrit 32.2. Initial white count was 2900; @ time of discharge it had risen to 4200. Creatinine and BUN were normal; however GFR was mildly reduced with a value of 62.    Review of Systems  His daughter Maxwell Copeland stated she was aware he did have anemia. He denies any signs of active GI blood loss such as melena or rectal bleeding.  He does have intermittent cough, sometimes productive of clear sputum. His daughter is concerned that he does choke even on water at times.     Objective:   Physical Exam  Pertinent or positive findings include: He appears frail and suboptimally nourished There is pattern alopecia. He is completely edentulous. Breath sounds are generally decreased. He has mild rales He has a  grade 1.5-2 blowing systolic murmur loudest at the lower left sternal border The aorta is palpable; I cannot definitely define this as an aneurysm. He also has a bruit over the aorta Pedal pulses are decreased He has trace ankle edema.  General appearance : in no distress. Eyes: No conjunctival inflammation or scleral icterus is present. Oral exam:  Lips and gums are healthy appearing.There is no oropharyngeal erythema or exudate noted.  Heart:  Normal rate and regular rhythm. S1 and S2 normal without gallop,  click, or rub . Lungs:No increased work of breathing (see O2 sats).  Abdomen:  bowel sounds normal, soft and non-tender without masses, organomegaly or hernias noted.  No guarding or rebound.  Skin:Warm & dry.  Intact without suspicious lesions or rashes ; no jaundice . Slight tenting Lymphatic: No lymphadenopathy is noted about the head, neck, axilla          Assessment & Plan:  #1 probable recurrent aspiration;  speech therapy referral  #2 coronary disease, advanced. He's elected no intervention. His nitroglycerin will be renewed. DNR discussed with him & daughter ; form provided.

## 2014-04-26 ENCOUNTER — Other Ambulatory Visit: Payer: Self-pay | Admitting: Internal Medicine

## 2014-04-26 DIAGNOSIS — J189 Pneumonia, unspecified organism: Secondary | ICD-10-CM

## 2014-05-01 ENCOUNTER — Other Ambulatory Visit (HOSPITAL_COMMUNITY): Payer: Self-pay | Admitting: Internal Medicine

## 2014-05-01 DIAGNOSIS — R1310 Dysphagia, unspecified: Secondary | ICD-10-CM

## 2014-05-01 DIAGNOSIS — Z9189 Other specified personal risk factors, not elsewhere classified: Secondary | ICD-10-CM

## 2014-05-03 ENCOUNTER — Ambulatory Visit (HOSPITAL_COMMUNITY): Payer: Medicare Other

## 2014-05-03 ENCOUNTER — Other Ambulatory Visit (HOSPITAL_COMMUNITY): Payer: Medicare Other

## 2014-05-11 ENCOUNTER — Inpatient Hospital Stay (HOSPITAL_COMMUNITY): Admission: RE | Admit: 2014-05-11 | Payer: Medicare Other | Source: Ambulatory Visit

## 2014-05-11 ENCOUNTER — Ambulatory Visit (HOSPITAL_COMMUNITY): Admission: RE | Admit: 2014-05-11 | Payer: Medicare Other | Source: Ambulatory Visit

## 2014-06-30 ENCOUNTER — Ambulatory Visit: Payer: Medicare Other | Admitting: Internal Medicine

## 2014-07-04 ENCOUNTER — Ambulatory Visit: Payer: Medicare Other | Admitting: Internal Medicine

## 2014-07-04 DIAGNOSIS — Z0289 Encounter for other administrative examinations: Secondary | ICD-10-CM

## 2014-08-22 ENCOUNTER — Encounter: Payer: Medicare Other | Admitting: Cardiovascular Disease

## 2014-08-22 NOTE — Progress Notes (Signed)
Patient ID: Maxwell Copeland Dapper, male   DOB: 02/23/1932, 80 y.o.   MRN: [id number] Maxwell Copeland Scarbro is a 80 y.o. male with a history of CAD status post non-STEMI in 2012, severe aortic stenosis, HL, prior stroke, HTN. Patient has refused CABG/AVR in the past. He was admitted 7/11-7/14 with a non-STEMI. Chest CT was also concerning for aspiration pneumonia. He was seen by the internal medicine service. Findings on chest CT were felt to likely be from mucous plugging more than aspiration. He was continued on antibiotics. Long discussions were held with the patient regarding surgical intervention. He was not interested in CABG or aortic valve replacement. He was not felt to be a candidate for TAVR secondary to severe CAD. Continued medical therapy has been recommended.   Patient returns for followup with his daughter. He denies chest discomfort or significant worsening dyspnea. He is NYHA 2b-3. He denies orthopnea, PND or edema. He denies syncope. He continues to have a cough with clear sputum production. He denies hemoptysis or fevers.   Studies: - LHC (8/12): Proximal/mid LAD 75-80%, proximal D1 75-80%, OM subtotal occlusion, proximal and mid circumflex stents patent with 40-50% between stents, distal circumflex 70%, mid RCA occluded, right to right and left-to-right collaterals - Echo (7/15): EF 55-60%, normal wall motion, grade 1 diastolic dysfunction, severe aortic stenosis (mean 35-40 mm Hg), mild to moderate AI, MAC, mild MR, mild LAE - Carotid US (9/12): RICA 60-79%; LICA 40-59%-followup 6 months      ROS: Denies fever, malais, weight loss, blurry vision, decreased visual acuity, cough, sputum, SOB, hemoptysis, pleuritic pain, palpitaitons, heartburn, abdominal pain, melena, lower extremity edema, claudication, or rash.  All other systems reviewed and negative  General: Affect appropriate Healthy:  appears stated age HEENT: normal Neck supple with no adenopathy JVP normal no bruits  no thyromegaly Lungs clear with no wheezing and good diaphragmatic motion Heart:  S1/S2 no murmur, no rub, gallop or click PMI normal Abdomen: benighn, BS positve, no tenderness, no AAA no bruit.  No HSM or HJR Distal pulses intact with no bruits No edema Neuro non-focal Skin warm and dry No muscular weakness   Current Outpatient Prescriptions  Medication Sig Dispense Refill  . acetaminophen (TYLENOL) 325 MG tablet Take 2 tablets (650 mg total) by mouth every 4 (four) hours as needed for mild pain, fever or headache.    Marland Kitchen. amLODipine (NORVASC) 10 MG tablet Take 10 mg by mouth every morning.     Marland Kitchen. aspirin 81 MG chewable tablet Chew 1 tablet (81 mg total) by mouth daily.    Marland Kitchen. atorvastatin (LIPITOR) 80 MG tablet Take 80 mg by mouth every morning.    . budesonide-formoterol (SYMBICORT) 160-4.5 MCG/ACT inhaler Inhale 2 puffs into the lungs 2 (two) times daily as needed (for shortness of breath).    . clopidogrel (PLAVIX) 75 MG tablet Take 75 mg by mouth every morning.     . feeding supplement, ENSURE COMPLETE, (ENSURE COMPLETE) LIQD Take 237 mLs by mouth 2 (two) times daily between meals.    . isosorbide mononitrate (IMDUR) 30 MG 24 hr tablet Take 30 mg by mouth every morning.    Marland Kitchen. levothyroxine (SYNTHROID, LEVOTHROID) 112 MCG tablet Take 112 mcg by mouth daily before breakfast.     . metoprolol (LOPRESSOR) 50 MG tablet Take 0.5 tablets (25 mg total) by mouth 2 (two) times daily.    . niacin (SLO-NIACIN) 500 MG tablet Take 500 mg by mouth every morning.     . nitroGLYCERIN (  NITROSTAT) 0.4 MG SL tablet Place 1 tablet (0.4 mg total) under the tongue every 5 (five) minutes as needed for chest pain. 25 tablet 3  . ranitidine (ZANTAC) 150 MG tablet Take 150 mg by mouth every morning.     . vitamin E 100 UNIT capsule Take 100 Units by mouth every morning.      No current facility-administered medications for this visit.    Allergies  Prednisone  Electrocardiogram: 8/15  SR rate 63 cannot r/o  old IMI   Assessment and Plan

## 2014-08-29 ENCOUNTER — Encounter: Payer: Self-pay | Admitting: Cardiovascular Disease

## 2015-03-05 ENCOUNTER — Other Ambulatory Visit: Payer: Self-pay

## 2015-09-13 ENCOUNTER — Ambulatory Visit (HOSPITAL_BASED_OUTPATIENT_CLINIC_OR_DEPARTMENT_OTHER)
Admission: RE | Admit: 2015-09-13 | Discharge: 2015-09-13 | Disposition: A | Discharge status: Home / Self Care | Payer: Medicare Other | Source: Ambulatory Visit | Attending: Family Medicine | Admitting: Family Medicine

## 2015-09-13 ENCOUNTER — Telehealth: Payer: Self-pay | Admitting: Family Medicine

## 2015-09-13 ENCOUNTER — Encounter: Payer: Self-pay | Admitting: Family Medicine

## 2015-09-13 ENCOUNTER — Ambulatory Visit (INDEPENDENT_AMBULATORY_CARE_PROVIDER_SITE_OTHER): Payer: Medicare Other | Admitting: Family Medicine

## 2015-09-13 VITALS — BP 156/79 | HR 69 | Temp 97.8°F | Resp 20 | Wt 148.5 lb

## 2015-09-13 DIAGNOSIS — R059 Cough, unspecified: Secondary | ICD-10-CM

## 2015-09-13 DIAGNOSIS — J209 Acute bronchitis, unspecified: Secondary | ICD-10-CM | POA: Insufficient documentation

## 2015-09-13 DIAGNOSIS — R05 Cough: Secondary | ICD-10-CM | POA: Diagnosis present

## 2015-09-13 DIAGNOSIS — R5383 Other fatigue: Secondary | ICD-10-CM

## 2015-09-13 MED ORDER — DOXYCYCLINE HYCLATE 100 MG PO TABS: 100.0000 mg | ORAL_TABLET | Freq: Two times a day (BID) | ORAL | Status: DC

## 2015-09-13 MED ORDER — BUDESONIDE-FORMOTEROL FUMARATE 160-4.5 MCG/ACT IN AERO: 2.0000 | INHALATION_SPRAY | Freq: Two times a day (BID) | RESPIRATORY_TRACT | Status: DC | PRN

## 2015-09-13 MED ORDER — GUAIFENESIN ER 600 MG PO TB12: 600.0000 mg | ORAL_TABLET | Freq: Two times a day (BID) | ORAL | Status: DC | PRN

## 2015-09-13 NOTE — Progress Notes (Signed)
Patient ID: Maxwell Copeland, male   DOB: 06/21/1932, 82 y.o.   MRN: [id number]   Subjective:    Patient ID: Maxwell Copeland   DOB: 10/13/1931, 82 y.o.    MRN: [id number]  HPI  Nasal congestion: pt presents with a 4 days history of nasal congestion,  rhinorrhea, dry cough, Burning with cough, Mild sore throat and headache. He also endorses mild shortness of breath, and more fatigued. ? Fever. He denies nausea, vomit, diarrhea, wheezing or rash. [facility] the [facility] ([facility] [place] [facility] [place] He is UTD with Flu shot. He lives with his son, who had a bad cold last week. He does admit to getting "strangle" with drinking fluids at times. He has had a h/o aspiration PNA in the past. He has a history of emphysema per chart and prescribed Symbicort, but he has not taken this for some time. OTC: tylenol, not using inhalers.   Past Medical History  Diagnosis Date  . Coronary artery disease     LAD w/ Ca++, 100% RCA, in-astent stenosis Cx, high grade lesion Ramus  . Hypercholesteremia   . Dyspnea   . Hypothyroid   . CVA (cerebral infarction)   . Arthritis     hips, will give out  . Varicella   . Depression     never took any medication.  . Emphysema of lung (HCC)     smoking related.  . Hypertension   . Stroke [facility](HCC)     left hemiparesis-100% recovery  . AAA (abdominal aortic aneurysm) (HCC)     small  . Severe aortic stenosis 2012   Allergies  Allergen Reactions  . Prednisone Palpitations    See 02/07/14 he feels this caused chest pain   Past Surgical History  Procedure Laterality Date  . Coronary angioplasty with stent placement      Several stents, last in 2004   Social History  Substance Use Topics  . Smoking status: Former Smoker    Quit date: 09/09/1987  . Smokeless tobacco: Current User  . Alcohol Use: No    Review of Systems Negative, with the exception of above mentioned in HPI     Objective:   Physical Exam BP 156/79 mmHg  Pulse 69  Temp(Src)  97.8 F (36.6 C)  Resp 20  Wt 148 lb 8 oz (67.359 kg)  SpO2 97% Body mass index is 22.58 kg/(m^2). Gen: Afebrile. No acute distress. Nontoxic in appearance. Well developed, thin, caucasian male. Pleasant.  HENT: AT. [place]. Bilateral TM visualized, WNL. MMM, no oral lesions. Bilateral nares with mild erythema, no swelling. Throat without erythema, No exudates. Cough present, Hoarseness present. Eyes:Pupils Equal Round Reactive to light, Extraocular movements intact,  Conjunctiva without redness, discharge or icterus. Neck/lymp/endocrine: Supple, No lymphadenopathy CV: RRR 2/6 SM with radiation to neck.  Chest: CTAB, with fine LLL crackle. Otherwise clear. Good air movement, Normal resp effort.  Abd: Soft. NTND. BS present Skin: No  rashes, purpura or petechiae.    Assessment & Plan:  Maxwell Copeland is a 82 y.o. present for acute OV with acute bronchitis, possible PNA. 1. Acute bronchitis, unspecified organism/cough/fatigue - DG Chest 2 View; Future - doxycycline (VIBRA-TABS) 100 MG tablet; Take 1 tablet (100 mg total) by mouth 2 (two) times daily.  Dispense: 20 tablet; Refill: 0 - guaiFENesin (MUCINEX) 600 MG 12 hr tablet; Take 1 tablet (600 mg total) by mouth 2 (two) times daily as needed.  Dispense: 30 tablet; Refill: 0 - doxycycline (unknown GFR  and good coverage Upper and lower pathogens) - mucinex - Rest, hydrate. Humidifier. Dehydration precautions.  - F/U 1 week, sooner if no improvement.

## 2015-09-13 NOTE — Telephone Encounter (Signed)
June called to inquire about symbicort samples. Advised that i would check. Because of the fact that we need to get the patient established with a new PCP @ [address], i shared the results above. Also shared CXR results. Suzanne,LPN notified.

## 2015-09-13 NOTE — Telephone Encounter (Signed)
Please call pt: - his CXR results showed a probable right sided pneumonia.  - he  Needs to follow up with PCP next 1-2 weeks, sooner if no improvement.

## 2015-09-20 ENCOUNTER — Ambulatory Visit: Payer: Medicare Other | Admitting: Family

## 2015-09-26 ENCOUNTER — Encounter: Payer: Self-pay | Admitting: Family

## 2015-09-26 ENCOUNTER — Ambulatory Visit (INDEPENDENT_AMBULATORY_CARE_PROVIDER_SITE_OTHER): Payer: Medicare Other | Admitting: Family

## 2015-09-26 VITALS — BP 180/84 | HR 65 | Temp 97.5°F | Resp 18 | Ht 68.0 in | Wt 147.8 lb

## 2015-09-26 DIAGNOSIS — J209 Acute bronchitis, unspecified: Secondary | ICD-10-CM

## 2015-09-26 NOTE — Assessment & Plan Note (Signed)
Acute bronchitis with underlying concern for pneumonia treated with doxycycline significantly improved with no shortness of breath, fevers, or continued illness. Did have one episode of vomiting and diarrhea over the last 24 hours. Unlikely related to previous diagnosis. Recommend conservative treatment with improved nutrition, fluid intake, and advance diet as tolerated. Follow-up if symptoms worsen or fail to improve with current conservative treatment.

## 2015-09-26 NOTE — Patient Instructions (Signed)
Thank you for choosing [facility].  Summary/Instructions:  Please continue to take your medications as prescribed.  Drink plenty of fluids including sports drinks and progress diet as tolerated.  If your symptoms worsen or fail to improve, please contact our office for further instruction, or in case of emergency go directly to the emergency room at the closest medical facility.

## 2015-09-26 NOTE — Progress Notes (Signed)
Subjective:    Patient ID: Maxwell Copeland, male    DOB: 13-Dec-1931, 82 y.o.   MRN: [id number]  Chief Complaint  Patient presents with  . Establish Care    feels better from the pneumonia, did have an episode of vomitting yesterday but has not had that issue today    HPI:  Maxwell Copeland is a 82 y.o. male who  has a past medical history of Coronary artery disease; Hypercholesteremia; Dyspnea; Hypothyroid; CVA (cerebral infarction); Arthritis; Varicella; Depression; Emphysema of lung (HCC); Hypertension; Stroke [facility]); AAA (abdominal aortic aneurysm) (HCC); and Severe aortic stenosis (2012). and presents today for a follow up office visit.   Recently evaluated in the office for congestion and diagnosed with bronchitis with concern for potential pneumonia. Completed the course of doxycycline as prescribed and denies adverse side effects. Notes some improvement since completing the antibiotic. He did have once episode of vomiting last night. Denies any vomiting today. Denies fevers or shortness of breath. Cough is improved. Denies abdominal pain but did have some loose stools last evening with no current bowel movements.   Allergies  Allergen Reactions  . Prednisone Palpitations    See 02/07/14 he feels this caused chest pain     Current Outpatient Prescriptions on File Prior to Visit  Medication Sig Dispense Refill  . acetaminophen (TYLENOL) 325 MG tablet Take 2 tablets (650 mg total) by mouth every 4 (four) hours as needed for mild pain, fever or headache.    Marland Kitchen amLODipine (NORVASC) 10 MG tablet Take 10 mg by mouth every morning.     Marland Kitchen aspirin 81 MG chewable tablet Chew 1 tablet (81 mg total) by mouth daily.    Marland Kitchen atorvastatin (LIPITOR) 80 MG tablet Take 80 mg by mouth every morning.    . budesonide-formoterol (SYMBICORT) 160-4.5 MCG/ACT inhaler Inhale 2 puffs into the lungs 2 (two) times daily as needed (for shortness of breath). Reported on 09/13/2015 1 Inhaler 11  . clopidogrel  (PLAVIX) 75 MG tablet Take 75 mg by mouth every morning.     . feeding supplement, ENSURE COMPLETE, (ENSURE COMPLETE) LIQD Take 237 mLs by mouth 2 (two) times daily between meals.    Marland Kitchen guaiFENesin (MUCINEX) 600 MG 12 hr tablet Take 1 tablet (600 mg total) by mouth 2 (two) times daily as needed. 30 tablet 0  . isosorbide mononitrate (IMDUR) 30 MG 24 hr tablet Take 30 mg by mouth every morning.    Marland Kitchen levothyroxine (SYNTHROID, LEVOTHROID) 112 MCG tablet Take 112 mcg by mouth daily before breakfast.     . metoprolol (LOPRESSOR) 50 MG tablet Take 0.5 tablets (25 mg total) by mouth 2 (two) times daily.    . niacin (SLO-NIACIN) 500 MG tablet Take 500 mg by mouth every morning.     . nitroGLYCERIN (NITROSTAT) 0.4 MG SL tablet Place 1 tablet (0.4 mg total) under the tongue every 5 (five) minutes as needed for chest pain. 25 tablet 3  . ranitidine (ZANTAC) 150 MG tablet Take 150 mg by mouth every morning.     . vitamin E 100 UNIT capsule Take 100 Units by mouth every morning.      No current facility-administered medications on file prior to visit.     Past Surgical History  Procedure Laterality Date  . Coronary angioplasty with stent placement      Several stents, last in 2004      Review of Systems  Constitutional: Negative for fever and chills.  Respiratory: Negative for cough,  chest tightness, shortness of breath and wheezing.   Cardiovascular: Negative for chest pain, palpitations and leg swelling.      Objective:    BP 180/84 mmHg  Pulse 65  Temp(Src) 97.5 F (36.4 C) (Oral)  Resp 18  Ht  (1.727 m)  Wt 147 lb 12.8 oz (67.042 kg)  BMI 22.48 kg/m2  SpO2 99% Nursing note and vital signs reviewed.  Physical Exam  Constitutional: He is oriented to person, place, and time. He appears well-developed and well-nourished. No distress.  Cardiovascular: Normal rate, regular rhythm and intact distal pulses.   Murmur heard. Pulmonary/Chest: Effort normal and breath sounds normal.    Neurological: He is alert and oriented to person, place, and time.  Skin: Skin is warm and dry.  Psychiatric: He has a normal mood and affect. His behavior is normal. Judgment and thought content normal.       Assessment & Plan:   Problem List Items Addressed This Visit      Respiratory   Acute bronchitis - Primary    Acute bronchitis with underlying concern for pneumonia treated with doxycycline significantly improved with no shortness of breath, fevers, or continued illness. Did have one episode of vomiting and diarrhea over the last 24 hours. Unlikely related to previous diagnosis. Recommend conservative treatment with improved nutrition, fluid intake, and advance diet as tolerated. Follow-up if symptoms worsen or fail to improve with current conservative treatment.

## 2015-09-26 NOTE — Progress Notes (Signed)
Pre visit review using our clinic review tool, if applicable. No additional management support is needed unless otherwise documented below in the visit note. 

## 2015-11-03 ENCOUNTER — Encounter: Payer: Self-pay | Admitting: Family Medicine

## 2015-11-03 ENCOUNTER — Ambulatory Visit (INDEPENDENT_AMBULATORY_CARE_PROVIDER_SITE_OTHER): Payer: Medicare Other | Admitting: Family Medicine

## 2015-11-03 VITALS — BP 101/60 | HR 67 | Temp 98.5°F | Resp 20 | Ht 68.0 in | Wt 146.5 lb

## 2015-11-03 DIAGNOSIS — J209 Acute bronchitis, unspecified: Secondary | ICD-10-CM

## 2015-11-03 MED ORDER — AMOXICILLIN-POT CLAVULANATE ER 1000-62.5 MG PO TB12: 2.0000 | ORAL_TABLET | Freq: Two times a day (BID) | ORAL | Status: DC

## 2015-11-03 MED ORDER — DOXYCYCLINE HYCLATE 100 MG PO TABS: 100.0000 mg | ORAL_TABLET | Freq: Two times a day (BID) | ORAL | Status: DC

## 2015-11-03 MED ORDER — BENZONATATE 100 MG PO CAPS: 100.0000 mg | ORAL_CAPSULE | Freq: Three times a day (TID) | ORAL | Status: DC | PRN

## 2015-11-03 NOTE — Progress Notes (Signed)
Pre visit review using our clinic review tool, if applicable. No additional management support is needed unless otherwise documented below in the visit note. 

## 2015-11-03 NOTE — Patient Instructions (Addendum)
Yogurt or probiotic [employer] or [employer] company 10 strain cap daily  Community-Acquired Pneumonia, Adult Pneumonia is an infection of the lungs. There are different types of pneumonia. One type can develop while a person is in a hospital. A different type, called community-acquired pneumonia, develops in people who are not, or have not recently been, in the hospital or other health care facility.  CAUSES Pneumonia may be caused by bacteria, viruses, or funguses. Community-acquired pneumonia is often caused by Streptococcus pneumonia bacteria. These bacteria are often passed from one person to another by breathing in droplets from the cough or sneeze of an infected person. RISK FACTORS The condition is more likely to develop in:  People who havechronic diseases, such as chronic obstructive pulmonary disease (COPD), asthma, congestive heart failure, cystic fibrosis, diabetes, or kidney disease.  People who haveearly-stage or late-stage HIV.  People who havesickle cell disease.  People who havehad their spleen removed (splenectomy).  People who havepoor Administrator.  People who havemedical conditions that increase the risk of breathing in (aspirating) secretions their own mouth and nose.   People who havea weakened immune system (immunocompromised).  People who smoke.  People whotravel to areas where pneumonia-causing germs commonly exist.  People whoare around animal habitats or animals that have pneumonia-causing germs, including birds, bats, rabbits, cats, and farm animals. SYMPTOMS Symptoms of this condition include:  Adry cough.  A wet (productive) cough.  Fever.  Sweating.  Chest pain, especially when breathing deeply or coughing.  Rapid breathing or difficulty breathing.  Shortness of breath.  Shaking chills.  Fatigue.  Muscle aches. DIAGNOSIS Your health care provider will take a medical history and perform a physical  exam. You may also have other tests, including:  Imaging studies of your chest, including X-rays.  Tests to check your blood oxygen level and other blood gases.  Other tests on blood, mucus (sputum), fluid around your lungs (pleural fluid), and urine. If your pneumonia is severe, other tests may be done to identify the specific cause of your illness. TREATMENT The type of treatment that you receive depends on many factors, such as the cause of your pneumonia, the medicines you take, and other medical conditions that you have. For most adults, treatment and recovery from pneumonia may occur at home. In some cases, treatment must happen in a hospital. Treatment may include:  Antibiotic medicines, if the pneumonia was caused by bacteria.  Antiviral medicines, if the pneumonia was caused by a virus.  Medicines that are given by mouth or through an IV tube.  Oxygen.  Respiratory therapy. Although rare, treating severe pneumonia may include:  Mechanical ventilation. This is done if you are not breathing well on your own and you cannot maintain a safe blood oxygen level.  Thoracentesis. This procedureremoves fluid around one lung or both lungs to help you breathe better. HOME CARE INSTRUCTIONS  Take over-the-counter and prescription medicines only as told by your health care provider.  Only takecough medicine if you are losing sleep. Understand that cough medicine can prevent your body's natural ability to remove mucus from your lungs.  If you were prescribed an antibiotic medicine, take it as told by your health care provider. Do not stop taking the antibiotic even if you start to feel better.  Sleep in a semi-upright position at night. Try sleeping in a reclining chair, or place a few pillows under your head.  Do not use tobacco products, including cigarettes, chewing tobacco, and e-cigarettes. If you  need help quitting, ask your health care provider.  Drink enough water to keep  your urine clear or pale yellow. This will help to thin out mucus secretions in your lungs. PREVENTION There are ways that you can decrease your risk of developing community-acquired pneumonia. Consider getting a pneumococcal vaccine if:  You are older than 80 years of age.  You are older than 80 years of age and are undergoing cancer treatment, have chronic lung disease, or have other medical conditions that affect your immune system. Ask your health care provider if this applies to you. There are different types and schedules of pneumococcal vaccines. Ask your health care provider which vaccination option is best for you. You may also prevent community-acquired pneumonia if you take these actions:  Get an influenza vaccine every year. Ask your health care provider which type of influenza vaccine is best for you.  Go to the dentist on a regular basis.  Wash your hands often. Use hand sanitizer if soap and water are not available. SEEK MEDICAL CARE IF:  You have a fever.  You are losing sleep because you cannot control your cough with cough medicine. SEEK IMMEDIATE MEDICAL CARE IF:  You have worsening shortness of breath.  You have increased chest pain.  Your sickness becomes worse, especially if you are an older adult or have a weakened immune system.  You cough up blood.   This information is not intended to replace advice given to you by your health care provider. Make sure you discuss any questions you have with your health care provider.   Document Released: 08/25/2005 Document Revised: 05/16/2015 Document Reviewed: 12/20/2014 Elsevier Interactive Patient Education [employer].

## 2015-11-04 NOTE — Progress Notes (Signed)
Patient ID: Maxwell Copeland, male   DOB: [birth year], 82 y.o.   MRN: [id number]   Subjective:    Patient ID: Maxwell Copeland, male    DOB: [birth year], 82 y.o.   MRN: [id number]  Chief Complaint  Patient presents with  . Cough    x 3 days  . chest congestion  . Nasal Congestion    HPI Patient is in today for evaluation of congestion. He is coming by his daughter. He did suffer from pneumonia in January but had been doing better to the last week when he began to develop more malaise, myalgias, headaches, anorexia, cough head and chest congestion. He has a runny nose. He has been eating but not a great deal. Complains of chills but denies fatigue. Denies nausea vomiting. Does chew tobacco. Has had infrequent loose stool, no urinary complaints.   Past Medical History  Diagnosis Date  . Coronary artery disease     LAD w/ Ca++, 100% RCA, in-astent stenosis Cx, high grade lesion Ramus  . Hypercholesteremia   . Dyspnea   . Hypothyroid   . CVA (cerebral infarction)   . Arthritis     hips, will give out  . Varicella   . Depression     never took any medication.  . Emphysema of lung (HCC)     smoking related.  . Hypertension   . Stroke [facility])     left hemiparesis-100% recovery  . AAA (abdominal aortic aneurysm) (HCC)     small  . Severe aortic stenosis 2012    Past Surgical History  Procedure Laterality Date  . Coronary angioplasty with stent placement      Several stents, last in 2004    Family History  Problem Relation Age of Onset  . Coronary artery disease    . Hyperlipidemia Mother   . Heart disease Mother     had artifical valve  . Heart disease Brother   . Hyperlipidemia Brother   . COPD Brother   . Heart disease Brother     CABG, valve replacement  . Alcohol abuse Brother   . Heart attack Mother   . Hypertension Mother     Social History   Social History  . Marital Status: Widowed    Spouse Name: N/A  . Number of Children: N/A  . Years of Education:  N/A   Occupational History  . retired    Social History Main Topics  . Smoking status: Former Smoker    Quit date: 09/09/1987  . Smokeless tobacco: Current User  . Alcohol Use: No  . Drug Use: No  . Sexual Activity: Not Currently   Other Topics Concern  . Not on file   Social History Narrative   From [place]. Lived [place] since 1960's. Married 47 years - widowed. 2 dtrs. Retired. Lives alone and independently but his dtr is close by.    Outpatient Prescriptions Prior to Visit  Medication Sig Dispense Refill  . acetaminophen (TYLENOL) 325 MG tablet Take 2 tablets (650 mg total) by mouth every 4 (four) hours as needed for mild pain, fever or headache.    Marland Kitchen amLODipine (NORVASC) 10 MG tablet Take 10 mg by mouth every morning.     Marland Kitchen aspirin 81 MG chewable tablet Chew 1 tablet (81 mg total) by mouth daily.    Marland Kitchen atorvastatin (LIPITOR) 80 MG tablet Take 80 mg by mouth every morning.    . budesonide-formoterol (SYMBICORT) 160-4.5 MCG/ACT inhaler Inhale 2 puffs into the lungs  2 (two) times daily as needed (for shortness of breath). Reported on 09/13/2015 1 Inhaler 11  . clopidogrel (PLAVIX) 75 MG tablet Take 75 mg by mouth every morning.     . feeding supplement, ENSURE COMPLETE, (ENSURE COMPLETE) LIQD Take 237 mLs by mouth 2 (two) times daily between meals.    Marland Kitchen guaiFENesin (MUCINEX) 600 MG 12 hr tablet Take 1 tablet (600 mg total) by mouth 2 (two) times daily as needed. 30 tablet 0  . isosorbide mononitrate (IMDUR) 30 MG 24 hr tablet Take 30 mg by mouth every morning.    Marland Kitchen levothyroxine (SYNTHROID, LEVOTHROID) 112 MCG tablet Take 112 mcg by mouth daily before breakfast.     . metoprolol (LOPRESSOR) 50 MG tablet Take 0.5 tablets (25 mg total) by mouth 2 (two) times daily.    . niacin (SLO-NIACIN) 500 MG tablet Take 500 mg by mouth every morning.     . nitroGLYCERIN (NITROSTAT) 0.4 MG SL tablet Place 1 tablet (0.4 mg total) under the tongue every 5 (five) minutes as needed for chest pain. 25  tablet 3  . ranitidine (ZANTAC) 150 MG tablet Take 150 mg by mouth every morning.     . vitamin E 100 UNIT capsule Take 100 Units by mouth every morning.      No facility-administered medications prior to visit.    Allergies  Allergen Reactions  . Prednisone Palpitations    See 02/07/14 he feels this caused chest pain    Review of Systems  Constitutional: Positive for malaise/fatigue. Negative for fever.  HENT: Positive for congestion.   Eyes: Negative for discharge.  Respiratory: Positive for cough, sputum production and shortness of breath.   Cardiovascular: Negative for chest pain, palpitations and leg swelling.  Gastrointestinal: Negative for nausea and abdominal pain.  Genitourinary: Negative for dysuria.  Musculoskeletal: Positive for myalgias. Negative for falls.  Skin: Negative for rash.  Neurological: Positive for headaches. Negative for loss of consciousness.  Endo/Heme/Allergies: Negative for environmental allergies.  Psychiatric/Behavioral: Negative for depression. The patient is not nervous/anxious.        Objective:    Physical Exam  Constitutional: He is oriented to person, place, and time. He appears well-developed and well-nourished. No distress.  HENT:  Head: Normocephalic and atraumatic.  Nose: Nose normal.  Eyes: Right eye exhibits no discharge. Left eye exhibits no discharge.  Neck: Normal range of motion. Neck supple.  Cardiovascular: Normal rate and regular rhythm.   No murmur heard. Pulmonary/Chest: Effort normal and breath sounds normal.  Abdominal: Soft. Bowel sounds are normal. There is no tenderness.  Musculoskeletal: He exhibits no edema.  Neurological: He is alert and oriented to person, place, and time.  Skin: Skin is warm and dry.  Psychiatric: He has a normal mood and affect.  Nursing note and vitals reviewed.   BP 101/60 mmHg  Pulse 67  Temp(Src) 98.5 F (36.9 C) (Oral)  Resp 20  Ht 5\' 8"  (1.727 m)  Wt 146 lb 8 oz (66.452 kg)   BMI 22.28 kg/m2  SpO2 97% Wt Readings from Last 3 Encounters:  11/03/15 146 lb 8 oz (66.452 kg)  09/26/15 147 lb 12.8 oz (67.042 kg)  09/13/15 148 lb 8 oz (67.359 kg)     Lab Results  Component Value Date   WBC 4.2 03/21/2014   HGB 10.7* 03/21/2014   HCT 32.4* 03/21/2014   PLT 124* 03/21/2014   GLUCOSE 86 03/19/2014   CHOL 101 03/19/2014   TRIG 40 03/19/2014   HDL 41  03/19/2014   LDLCALC 52 03/19/2014   ALT 20 03/19/2014   AST 48* 03/19/2014   NA 139 03/19/2014   K 3.8 03/19/2014   CL 102 03/19/2014   CREATININE 1.10 03/19/2014   BUN 17 03/19/2014   CO2 24 03/19/2014   TSH 0.499 03/20/2014   PSA 4.12* 01/11/2013   INR 1.23 03/18/2014    Lab Results  Component Value Date   TSH 0.499 03/20/2014   Lab Results  Component Value Date   WBC 4.2 03/21/2014   HGB 10.7* 03/21/2014   HCT 32.4* 03/21/2014   MCV 87.8 03/21/2014   PLT 124* 03/21/2014   Lab Results  Component Value Date   NA 139 03/19/2014   K 3.8 03/19/2014   CO2 24 03/19/2014   GLUCOSE 86 03/19/2014   BUN 17 03/19/2014   CREATININE 1.10 03/19/2014   BILITOT 0.4 03/19/2014   ALKPHOS 72 03/19/2014   AST 48* 03/19/2014   ALT 20 03/19/2014   PROT 6.4 03/19/2014   ALBUMIN 3.3* 03/19/2014   CALCIUM 8.6 03/19/2014   ANIONGAP 13 03/19/2014   GFR 44.91* 01/11/2013   Lab Results  Component Value Date   CHOL 101 03/19/2014   Lab Results  Component Value Date   HDL 41 03/19/2014   Lab Results  Component Value Date   LDLCALC 52 03/19/2014   Lab Results  Component Value Date   TRIG 40 03/19/2014   Lab Results  Component Value Date   CHOLHDL 2.5 03/19/2014   No results found for: HGBA1C     Assessment & Plan:   Problem List Items Addressed This Visit    Acute bronchitis - Primary    Vs pneumonia. Started on Doxycycline, Mucinex, Tessalon Perles and CXR ordered. Push fluids and seek further care if symptoms worsen      Relevant Orders   DG Chest 2 View      I have discontinued Mr.  Dibble's amoxicillin-clavulanate. I am also having him start on benzonatate and doxycycline. Additionally, I am having him maintain his amLODipine, levothyroxine, clopidogrel, niacin, ranitidine, vitamin E, atorvastatin, isosorbide mononitrate, acetaminophen, aspirin, feeding supplement (ENSURE COMPLETE), metoprolol, nitroGLYCERIN, guaiFENesin, and budesonide-formoterol.  Meds ordered this encounter  Medications  . DISCONTD: amoxicillin-clavulanate (AUGMENTIN XR) 1000-62.5 MG 12 hr tablet    Sig: Take 2 tablets by mouth 2 (two) times daily.    Dispense:  20 tablet    Refill:  0  . benzonatate (TESSALON) 100 MG capsule    Sig: Take 1 capsule (100 mg total) by mouth 3 (three) times daily as needed for cough.    Dispense:  40 capsule    Refill:  0  . doxycycline (VIBRA-TABS) 100 MG tablet    Sig: Take 1 tablet (100 mg total) by mouth 2 (two) times daily.    Dispense:  20 tablet    Refill:  0     Danise Edge, MD

## 2015-11-04 NOTE — Assessment & Plan Note (Addendum)
Vs pneumonia. Started on Doxycycline, Mucinex, Tessalon Perles and CXR ordered. Push fluids and seek further care if symptoms worsen

## 2015-12-22 ENCOUNTER — Emergency Department (HOSPITAL_COMMUNITY): Payer: Medicare Other

## 2015-12-22 ENCOUNTER — Encounter (HOSPITAL_COMMUNITY): Payer: Self-pay | Admitting: Emergency Medicine

## 2015-12-22 ENCOUNTER — Inpatient Hospital Stay (HOSPITAL_COMMUNITY)
Admission: EM | Admit: 2015-12-22 | Discharge: 2015-12-25 | DRG: 536 | Disposition: A | Discharge status: Skilled Nursing Facility | Payer: Medicare Other | Attending: Internal Medicine | Admitting: Internal Medicine

## 2015-12-22 DIAGNOSIS — Z79899 Other long term (current) drug therapy: Secondary | ICD-10-CM

## 2015-12-22 DIAGNOSIS — E039 Hypothyroidism, unspecified: Secondary | ICD-10-CM | POA: Diagnosis not present

## 2015-12-22 DIAGNOSIS — I350 Nonrheumatic aortic (valve) stenosis: Secondary | ICD-10-CM | POA: Diagnosis not present

## 2015-12-22 DIAGNOSIS — J439 Emphysema, unspecified: Secondary | ICD-10-CM | POA: Diagnosis present

## 2015-12-22 DIAGNOSIS — Z888 Allergy status to other drugs, medicaments and biological substances status: Secondary | ICD-10-CM

## 2015-12-22 DIAGNOSIS — R000 Tachycardia, unspecified: Secondary | ICD-10-CM | POA: Diagnosis present

## 2015-12-22 DIAGNOSIS — I714 Abdominal aortic aneurysm, without rupture: Secondary | ICD-10-CM | POA: Diagnosis not present

## 2015-12-22 DIAGNOSIS — I119 Hypertensive heart disease without heart failure: Secondary | ICD-10-CM | POA: Diagnosis present

## 2015-12-22 DIAGNOSIS — M160 Bilateral primary osteoarthritis of hip: Secondary | ICD-10-CM | POA: Diagnosis present

## 2015-12-22 DIAGNOSIS — W1839XA Other fall on same level, initial encounter: Secondary | ICD-10-CM | POA: Diagnosis present

## 2015-12-22 DIAGNOSIS — M25552 Pain in left hip: Secondary | ICD-10-CM | POA: Diagnosis not present

## 2015-12-22 DIAGNOSIS — S72002A Fracture of unspecified part of neck of left femur, initial encounter for closed fracture: Secondary | ICD-10-CM

## 2015-12-22 DIAGNOSIS — F17290 Nicotine dependence, other tobacco product, uncomplicated: Secondary | ICD-10-CM | POA: Diagnosis present

## 2015-12-22 DIAGNOSIS — Z7951 Long term (current) use of inhaled steroids: Secondary | ICD-10-CM

## 2015-12-22 DIAGNOSIS — M8588 Other specified disorders of bone density and structure, other site: Secondary | ICD-10-CM | POA: Diagnosis present

## 2015-12-22 DIAGNOSIS — I252 Old myocardial infarction: Secondary | ICD-10-CM

## 2015-12-22 DIAGNOSIS — Y92000 Kitchen of unspecified non-institutional (private) residence as  the place of occurrence of the external cause: Secondary | ICD-10-CM

## 2015-12-22 DIAGNOSIS — Z8673 Personal history of transient ischemic attack (TIA), and cerebral infarction without residual deficits: Secondary | ICD-10-CM

## 2015-12-22 DIAGNOSIS — Z7902 Long term (current) use of antithrombotics/antiplatelets: Secondary | ICD-10-CM

## 2015-12-22 DIAGNOSIS — Z8249 Family history of ischemic heart disease and other diseases of the circulatory system: Secondary | ICD-10-CM

## 2015-12-22 DIAGNOSIS — N179 Acute kidney failure, unspecified: Secondary | ICD-10-CM | POA: Diagnosis not present

## 2015-12-22 DIAGNOSIS — S72145A Nondisplaced intertrochanteric fracture of left femur, initial encounter for closed fracture: Secondary | ICD-10-CM | POA: Diagnosis not present

## 2015-12-22 DIAGNOSIS — Z7982 Long term (current) use of aspirin: Secondary | ICD-10-CM

## 2015-12-22 DIAGNOSIS — I25119 Atherosclerotic heart disease of native coronary artery with unspecified angina pectoris: Secondary | ICD-10-CM | POA: Diagnosis present

## 2015-12-22 DIAGNOSIS — R3915 Urgency of urination: Secondary | ICD-10-CM | POA: Diagnosis present

## 2015-12-22 DIAGNOSIS — Z955 Presence of coronary angioplasty implant and graft: Secondary | ICD-10-CM

## 2015-12-22 DIAGNOSIS — Z01810 Encounter for preprocedural cardiovascular examination: Secondary | ICD-10-CM | POA: Diagnosis not present

## 2015-12-22 DIAGNOSIS — M62838 Other muscle spasm: Secondary | ICD-10-CM | POA: Diagnosis not present

## 2015-12-22 DIAGNOSIS — Z01811 Encounter for preprocedural respiratory examination: Secondary | ICD-10-CM

## 2015-12-22 DIAGNOSIS — S72009A Fracture of unspecified part of neck of unspecified femur, initial encounter for closed fracture: Secondary | ICD-10-CM | POA: Diagnosis present

## 2015-12-22 DIAGNOSIS — I209 Angina pectoris, unspecified: Secondary | ICD-10-CM | POA: Diagnosis not present

## 2015-12-22 DIAGNOSIS — E7800 Pure hypercholesterolemia, unspecified: Secondary | ICD-10-CM | POA: Diagnosis present

## 2015-12-22 DIAGNOSIS — S72142A Displaced intertrochanteric fracture of left femur, initial encounter for closed fracture: Secondary | ICD-10-CM | POA: Diagnosis not present

## 2015-12-22 LAB — URINALYSIS, ROUTINE W REFLEX MICROSCOPIC
Bilirubin Urine: NEGATIVE
Glucose, UA: NEGATIVE mg/dL
HGB URINE DIPSTICK: NEGATIVE
Ketones, ur: NEGATIVE mg/dL
Leukocytes, UA: NEGATIVE
Nitrite: NEGATIVE
Protein, ur: NEGATIVE mg/dL
SPECIFIC GRAVITY, URINE: 1.011 (ref 1.005–1.030)
pH: 7 (ref 5.0–8.0)

## 2015-12-22 LAB — CBC WITH DIFFERENTIAL/PLATELET
BASOS PCT: 0 %
Basophils Absolute: 0 10*3/uL (ref 0.0–0.1)
EOS ABS: 0.7 10*3/uL (ref 0.0–0.7)
Eosinophils Relative: 11 %
HCT: 35.4 % — ABNORMAL LOW (ref 39.0–52.0)
HEMOGLOBIN: 11.7 g/dL — AB (ref 13.0–17.0)
Lymphocytes Relative: 23 %
Lymphs Abs: 1.7 10*3/uL (ref 0.7–4.0)
MCH: 30.1 pg (ref 26.0–34.0)
MCHC: 33.1 g/dL (ref 30.0–36.0)
MCV: 91 fL (ref 78.0–100.0)
MONOS PCT: 7 %
Monocytes Absolute: 0.5 10*3/uL (ref 0.1–1.0)
NEUTROS PCT: 59 %
Neutro Abs: 4.2 10*3/uL (ref 1.7–7.7)
PLATELETS: 167 10*3/uL (ref 150–400)
RBC: 3.89 MIL/uL — ABNORMAL LOW (ref 4.22–5.81)
RDW: 13.2 % (ref 11.5–15.5)
WBC: 7.1 10*3/uL (ref 4.0–10.5)

## 2015-12-22 LAB — COMPREHENSIVE METABOLIC PANEL
ALK PHOS: 91 U/L (ref 38–126)
ALT: 16 U/L — AB (ref 17–63)
AST: 30 U/L (ref 15–41)
Albumin: 3.9 g/dL (ref 3.5–5.0)
Anion gap: 9 (ref 5–15)
BUN: 19 mg/dL (ref 6–20)
CALCIUM: 9.2 mg/dL (ref 8.9–10.3)
CO2: 25 mmol/L (ref 22–32)
CREATININE: 1.31 mg/dL — AB (ref 0.61–1.24)
Chloride: 102 mmol/L (ref 101–111)
GFR, EST AFRICAN AMERICAN: 57 mL/min — AB (ref >60)
GFR, EST NON AFRICAN AMERICAN: 49 mL/min — AB (ref >60)
Glucose, Bld: 105 mg/dL — ABNORMAL HIGH (ref 65–99)
Potassium: 3.8 mmol/L (ref 3.5–5.1)
Sodium: 136 mmol/L (ref 135–145)
Total Bilirubin: 0.7 mg/dL (ref 0.3–1.2)
Total Protein: 7.5 g/dL (ref 6.5–8.1)

## 2015-12-22 LAB — TYPE AND SCREEN
ABO/RH(D): B POS
Antibody Screen: NEGATIVE

## 2015-12-22 MED ORDER — HYDROMORPHONE HCL 1 MG/ML IJ SOLN
1.0000 mg | Freq: Once | INTRAMUSCULAR | Status: AC
  Administered 2015-12-22: 1 mg via INTRAMUSCULAR
  Filled 2015-12-22: qty 1

## 2015-12-22 MED ORDER — SODIUM CHLORIDE 0.9 % IV SOLN
INTRAVENOUS | Status: DC
  Administered 2015-12-22: 23:00:00 via INTRAVENOUS

## 2015-12-22 MED ORDER — FENTANYL CITRATE (PF) 100 MCG/2ML IJ SOLN
50.0000 ug | INTRAMUSCULAR | Status: AC | PRN
  Administered 2015-12-22 – 2015-12-25 (×2): 50 ug via INTRAVENOUS
  Filled 2015-12-22 (×3): qty 2

## 2015-12-22 MED ORDER — ONDANSETRON HCL 4 MG/2ML IJ SOLN
4.0000 mg | Freq: Once | INTRAMUSCULAR | Status: AC
Start: 2015-12-22 — End: 2015-12-22
  Administered 2015-12-22: 4 mg via INTRAVENOUS
  Filled 2015-12-22: qty 2

## 2015-12-22 NOTE — ED Notes (Signed)
Pt taken to xray 

## 2015-12-22 NOTE — ED Provider Notes (Signed)
CSN: [id number]     Arrival date & time 12/22/15  2102 History   First MD Initiated Contact with Patient 12/22/15 2112     Chief Complaint  Patient presents with  . Fall  . Hip Pain     (Consider location/radiation/quality/duration/timing/severity/associated sxs/prior Treatment) HPI   Maxwell Copeland is a 82 y.o. male who was seated at the kitchen table for "a long time," when he stood up, took one step and fell because his left leg gave way. This caused him to land directly onto his left hip. There were no other injuries. There are no other preceding symptoms. He was unable to get up because of the pain. The patient was able to call his daughter came to his house to get him. His daughter was able to get in here by private vehicle with the help of her son. No prior similar problem. There are no other known modifying factors.    Past Medical History  Diagnosis Date  . Coronary artery disease     LAD w/ Ca++, 100% RCA, in-astent stenosis Cx, high grade lesion Ramus  . Hypercholesteremia   . Dyspnea   . Hypothyroid   . CVA (cerebral infarction)   . Arthritis     hips, will give out  . Varicella   . Depression     never took any medication.  . Emphysema of lung (HCC)     smoking related.  . Hypertension   . Stroke [facility])     left hemiparesis-100% recovery  . AAA (abdominal aortic aneurysm) (HCC)     small  . Severe aortic stenosis 2012   Past Surgical History  Procedure Laterality Date  . Coronary angioplasty with stent placement      Several stents, last in 2004   Family History  Problem Relation Age of Onset  . Coronary artery disease    . Hyperlipidemia Mother   . Heart disease Mother     had artifical valve  . Heart disease Brother   . Hyperlipidemia Brother   . COPD Brother   . Heart disease Brother     CABG, valve replacement  . Alcohol abuse Brother   . Heart attack Mother   . Hypertension Mother    Social History  Substance Use Topics  . Smoking  status: Former Smoker    Quit date: 09/09/1987  . Smokeless tobacco: Current User  . Alcohol Use: No    Review of Systems  All other systems reviewed and are negative.     Allergies  Prednisone  Home Medications   Prior to Admission medications   Medication Sig Start Date End Date Taking? Authorizing Provider  acetaminophen (TYLENOL) 325 MG tablet Take 2 tablets (650 mg total) by mouth every 4 (four) hours as needed for mild pain, fever or headache. 03/21/14  Yes Eda Paschal Kilroy, PA-C  amLODipine (NORVASC) 10 MG tablet Take 10 mg by mouth every morning.    Yes Historical Provider, MD  aspirin 325 MG tablet Take 325 mg by mouth daily.   Yes Historical Provider, MD  atorvastatin (LIPITOR) 80 MG tablet Take 80 mg by mouth every morning.   Yes Historical Provider, MD  budesonide-formoterol (SYMBICORT) 160-4.5 MCG/ACT inhaler Inhale 2 puffs into the lungs 2 (two) times daily as needed (for shortness of breath). Reported on 09/13/2015 09/13/15  Yes Renee A Kuneff, DO  clopidogrel (PLAVIX) 75 MG tablet Take 75 mg by mouth every morning.    Yes Historical Provider, MD  isosorbide  mononitrate (IMDUR) 30 MG 24 hr tablet Take 30 mg by mouth every morning.   Yes Historical Provider, MD  levothyroxine (SYNTHROID, LEVOTHROID) 112 MCG tablet Take 112 mcg by mouth daily before breakfast.    Yes Historical Provider, MD  metoprolol (LOPRESSOR) 50 MG tablet Take 0.5 tablets (25 mg total) by mouth 2 (two) times daily. 03/21/14  Yes Luke K Kilroy, PA-C  niacin (SLO-NIACIN) 500 MG tablet Take 500 mg by mouth every morning.    Yes Historical Provider, MD  nitroGLYCERIN (NITROSTAT) 0.4 MG SL tablet Place 1 tablet (0.4 mg total) under the tongue every 5 (five) minutes as needed for chest pain. 04/13/14  Yes Pecola Lawless, MD  ranitidine (ZANTAC) 150 MG tablet Take 150 mg by mouth every morning.    Yes Historical Provider, MD  vitamin E 400 UNIT capsule Take 800 Units by mouth daily.   Yes Historical Provider, MD   aspirin 81 MG chewable tablet Chew 1 tablet (81 mg total) by mouth daily. 03/21/14   Abelino Derrick, PA-C  benzonatate (TESSALON) 100 MG capsule Take 1 capsule (100 mg total) by mouth 3 (three) times daily as needed for cough. 11/03/15   Bradd Canary, MD  doxycycline (VIBRA-TABS) 100 MG tablet Take 1 tablet (100 mg total) by mouth 2 (two) times daily. 11/03/15   Bradd Canary, MD  feeding supplement, ENSURE COMPLETE, (ENSURE COMPLETE) LIQD Take 237 mLs by mouth 2 (two) times daily between meals. 03/21/14   Abelino Derrick, PA-C  guaiFENesin (MUCINEX) 600 MG 12 hr tablet Take 1 tablet (600 mg total) by mouth 2 (two) times daily as needed. 09/13/15   Renee A Kuneff, DO   BP 162/74 mmHg  Pulse 108  Temp(Src) 98.6 F (37 C) (Oral)  Resp 13  Ht  (1.727 m)  Wt 150 lb (68.04 kg)  BMI 22.81 kg/m2  SpO2 94% Physical Exam  Constitutional: He is oriented to person, place, and time. He appears well-developed and well-nourished.  HENT:  Head: Normocephalic and atraumatic.  Right Ear: External ear normal.  Left Ear: External ear normal.  Eyes: Conjunctivae and EOM are normal. Pupils are equal, round, and reactive to light.  Neck: Normal range of motion and phonation normal. Neck supple.  Cardiovascular: Normal rate.   Pulmonary/Chest: Effort normal. No respiratory distress. He exhibits no tenderness and no bony tenderness.  Abdominal: Soft. There is no tenderness.  Musculoskeletal: Normal range of motion.  Tender left hip with increased pain on motion. No shortening of the leg. Pelvis is stable.  Neurological: He is alert and oriented to person, place, and time. No cranial nerve deficit or sensory deficit. He exhibits normal muscle tone. Coordination normal.  Skin: Skin is warm, dry and intact.  Psychiatric: He has a normal mood and affect. His behavior is normal. Judgment and thought content normal.  Nursing note and vitals reviewed.   ED Course  Procedures (including critical care  time)  Initial clinical impression- isolated injury, likely left hip fracture. This is most likely a mechanical fall. Will screen for infection, and metabolic abnormalities.  Medications  0.9 %  sodium chloride infusion ( Intravenous New Bag/Given 12/22/15 2244)  fentaNYL (SUBLIMAZE) injection 50 mcg (50 mcg Intravenous Given 12/22/15 2216)  ondansetron (ZOFRAN) injection 4 mg (4 mg Intravenous Given 12/22/15 2252)  HYDROmorphone (DILAUDID) injection 1 mg (1 mg Intramuscular Given 12/22/15 2232)    Patient Vitals for the past 24 hrs:  BP Temp Temp src Pulse Resp SpO2  Height Weight  12/22/15 2315 162/74 mmHg - - 108 13 94 % - -  12/22/15 2245 160/72 mmHg - - 86 15 96 % - -  12/22/15 2106 - 98.6 F (37 C) Oral - - - - -  12/22/15 2105 183/68 mmHg - Oral 84 22 100 % 5\' 8"  (1.727 m) 150 lb (68.04 kg)   23:15- case discussed with orthopedics, Dr. [name], who will see the patient tomorrow and prepare for surgery.  11:25 PM-Consult complete with Hospitalist. Patient case explained and discussed. She agrees to admit patient for further evaluation and treatment. Call ended at 00:06  12:07 AM Reevaluation with update and discussion. After initial assessment and treatment, an updated evaluation reveals he remains fairly comfortable. He has some urinary urgency. Patient family updated on findings. Maxwell Copeland L    Labs Review Labs Reviewed  COMPREHENSIVE METABOLIC PANEL - Abnormal; Notable for the following:    Glucose, Bld 105 (*)    Creatinine, Ser 1.31 (*)    ALT 16 (*)    GFR calc non Af Amer 49 (*)    GFR calc Af Amer 57 (*)    All other components within normal limits  CBC WITH DIFFERENTIAL/PLATELET - Abnormal; Notable for the following:    RBC 3.89 (*)    Hemoglobin 11.7 (*)    HCT 35.4 (*)    All other components within normal limits  URINE CULTURE  URINALYSIS, ROUTINE W REFLEX MICROSCOPIC (NOT [facility])  TYPE AND SCREEN    Imaging Review Dg Hip Unilat With Pelvis 2-3 Views  Left  12/22/2015  CLINICAL DATA:  Fall with left hip pain EXAM: DG HIP (WITH OR WITHOUT PELVIS) 2-3V LEFT COMPARISON:  None. FINDINGS: Comminuted nondisplaced intertrochanteric left proximal femur fracture. No additional fracture. No dislocation or appreciable arthropathy in the left hip. Diffuse osteopenia. Degenerative changes in the visualized lower lumbar spine. Vascular calcifications throughout the soft tissues. Prominent stool throughout the visualized colon. IMPRESSION: Comminuted nondisplaced intertrochanteric left proximal femur fracture. Diffuse osteopenia. Electronically Signed   By: Delbert PhenixJason A Poff M.D.   On: 12/22/2015 22:00   I have personally reviewed and evaluated these images and lab results as part of my medical decision-making.   EKG Interpretation None      MDM   Final diagnoses:  Closed left hip fracture, initial encounter (HCC)    Mechanical fall, isolated left hip fracture, hemodynamically stable. Doubt serous bacterial infection. Metabolic instability or impending vascular collapse.  Nursing Notes Reviewed/ Care Coordinated Applicable Imaging Reviewed Interpretation of Laboratory Data incorporated into ED treatment  Plan: Admit    Mancel BaleElliott Lin Hackmann, MD 12/23/15 0011

## 2015-12-22 NOTE — ED Notes (Signed)
Patient to X-ray

## 2015-12-22 NOTE — ED Notes (Signed)
Pt. lost his balance and fell at home while trying to get up from sitting position , denies LOC , reports pain at left hip , pain increases with movement and changing positions .

## 2015-12-22 NOTE — ED Notes (Signed)
Pt requesting to take home nitro for chest pain. C/o severe L leg and hip pain, MD made aware

## 2015-12-22 NOTE — ED Notes (Signed)
Multiple IV attempts without success. Pt is severe pain with muscle spasms to L leg. Fentanyl given IM until access is obtained

## 2015-12-23 ENCOUNTER — Inpatient Hospital Stay (HOSPITAL_COMMUNITY): Payer: Medicare Other

## 2015-12-23 ENCOUNTER — Encounter (HOSPITAL_COMMUNITY): Payer: Self-pay | Admitting: Cardiology

## 2015-12-23 DIAGNOSIS — Y92000 Kitchen of unspecified non-institutional (private) residence as  the place of occurrence of the external cause: Secondary | ICD-10-CM | POA: Diagnosis not present

## 2015-12-23 DIAGNOSIS — I119 Hypertensive heart disease without heart failure: Secondary | ICD-10-CM | POA: Diagnosis present

## 2015-12-23 DIAGNOSIS — I10 Essential (primary) hypertension: Secondary | ICD-10-CM | POA: Diagnosis not present

## 2015-12-23 DIAGNOSIS — M62838 Other muscle spasm: Secondary | ICD-10-CM | POA: Diagnosis not present

## 2015-12-23 DIAGNOSIS — I350 Nonrheumatic aortic (valve) stenosis: Secondary | ICD-10-CM | POA: Diagnosis not present

## 2015-12-23 DIAGNOSIS — S72002D Fracture of unspecified part of neck of left femur, subsequent encounter for closed fracture with routine healing: Secondary | ICD-10-CM | POA: Diagnosis not present

## 2015-12-23 DIAGNOSIS — Z8249 Family history of ischemic heart disease and other diseases of the circulatory system: Secondary | ICD-10-CM | POA: Diagnosis not present

## 2015-12-23 DIAGNOSIS — I25119 Atherosclerotic heart disease of native coronary artery with unspecified angina pectoris: Secondary | ICD-10-CM | POA: Diagnosis present

## 2015-12-23 DIAGNOSIS — R296 Repeated falls: Secondary | ICD-10-CM | POA: Diagnosis not present

## 2015-12-23 DIAGNOSIS — E7800 Pure hypercholesterolemia, unspecified: Secondary | ICD-10-CM | POA: Diagnosis present

## 2015-12-23 DIAGNOSIS — I209 Angina pectoris, unspecified: Secondary | ICD-10-CM

## 2015-12-23 DIAGNOSIS — I714 Abdominal aortic aneurysm, without rupture: Secondary | ICD-10-CM | POA: Diagnosis not present

## 2015-12-23 DIAGNOSIS — R3915 Urgency of urination: Secondary | ICD-10-CM | POA: Diagnosis present

## 2015-12-23 DIAGNOSIS — M8580 Other specified disorders of bone density and structure, unspecified site: Secondary | ICD-10-CM | POA: Diagnosis not present

## 2015-12-23 DIAGNOSIS — F17290 Nicotine dependence, other tobacco product, uncomplicated: Secondary | ICD-10-CM | POA: Diagnosis present

## 2015-12-23 DIAGNOSIS — N179 Acute kidney failure, unspecified: Secondary | ICD-10-CM | POA: Diagnosis not present

## 2015-12-23 DIAGNOSIS — Z7951 Long term (current) use of inhaled steroids: Secondary | ICD-10-CM | POA: Diagnosis not present

## 2015-12-23 DIAGNOSIS — S72009A Fracture of unspecified part of neck of unspecified femur, initial encounter for closed fracture: Secondary | ICD-10-CM | POA: Diagnosis not present

## 2015-12-23 DIAGNOSIS — Z7982 Long term (current) use of aspirin: Secondary | ICD-10-CM | POA: Diagnosis not present

## 2015-12-23 DIAGNOSIS — Z79899 Other long term (current) drug therapy: Secondary | ICD-10-CM | POA: Diagnosis not present

## 2015-12-23 DIAGNOSIS — I2510 Atherosclerotic heart disease of native coronary artery without angina pectoris: Secondary | ICD-10-CM | POA: Diagnosis not present

## 2015-12-23 DIAGNOSIS — Z01810 Encounter for preprocedural cardiovascular examination: Secondary | ICD-10-CM

## 2015-12-23 DIAGNOSIS — Z01818 Encounter for other preprocedural examination: Secondary | ICD-10-CM | POA: Diagnosis not present

## 2015-12-23 DIAGNOSIS — R000 Tachycardia, unspecified: Secondary | ICD-10-CM | POA: Diagnosis present

## 2015-12-23 DIAGNOSIS — Z955 Presence of coronary angioplasty implant and graft: Secondary | ICD-10-CM | POA: Diagnosis not present

## 2015-12-23 DIAGNOSIS — Z7902 Long term (current) use of antithrombotics/antiplatelets: Secondary | ICD-10-CM | POA: Diagnosis not present

## 2015-12-23 DIAGNOSIS — W1839XA Other fall on same level, initial encounter: Secondary | ICD-10-CM | POA: Diagnosis present

## 2015-12-23 DIAGNOSIS — M8588 Other specified disorders of bone density and structure, other site: Secondary | ICD-10-CM | POA: Diagnosis present

## 2015-12-23 DIAGNOSIS — M160 Bilateral primary osteoarthritis of hip: Secondary | ICD-10-CM | POA: Diagnosis not present

## 2015-12-23 DIAGNOSIS — E039 Hypothyroidism, unspecified: Secondary | ICD-10-CM | POA: Diagnosis not present

## 2015-12-23 DIAGNOSIS — S72002A Fracture of unspecified part of neck of left femur, initial encounter for closed fracture: Secondary | ICD-10-CM | POA: Diagnosis not present

## 2015-12-23 DIAGNOSIS — M25552 Pain in left hip: Secondary | ICD-10-CM | POA: Diagnosis present

## 2015-12-23 DIAGNOSIS — Z888 Allergy status to other drugs, medicaments and biological substances status: Secondary | ICD-10-CM | POA: Diagnosis not present

## 2015-12-23 DIAGNOSIS — S72145A Nondisplaced intertrochanteric fracture of left femur, initial encounter for closed fracture: Secondary | ICD-10-CM | POA: Diagnosis present

## 2015-12-23 DIAGNOSIS — S72142A Displaced intertrochanteric fracture of left femur, initial encounter for closed fracture: Secondary | ICD-10-CM | POA: Diagnosis not present

## 2015-12-23 DIAGNOSIS — I252 Old myocardial infarction: Secondary | ICD-10-CM | POA: Diagnosis not present

## 2015-12-23 DIAGNOSIS — J439 Emphysema, unspecified: Secondary | ICD-10-CM | POA: Diagnosis not present

## 2015-12-23 DIAGNOSIS — Z8673 Personal history of transient ischemic attack (TIA), and cerebral infarction without residual deficits: Secondary | ICD-10-CM | POA: Diagnosis not present

## 2015-12-23 LAB — ECHOCARDIOGRAM COMPLETE
HEIGHTINCHES: 68 in
WEIGHTICAEL: 2398.6 [oz_av]

## 2015-12-23 LAB — CBC
HCT: 35.4 % — ABNORMAL LOW (ref 39.0–52.0)
Hemoglobin: 11.8 g/dL — ABNORMAL LOW (ref 13.0–17.0)
MCH: 30.1 pg (ref 26.0–34.0)
MCHC: 33.3 g/dL (ref 30.0–36.0)
MCV: 90.3 fL (ref 78.0–100.0)
PLATELETS: 138 10*3/uL — AB (ref 150–400)
RBC: 3.92 MIL/uL — AB (ref 4.22–5.81)
RDW: 13.2 % (ref 11.5–15.5)
WBC: 15 10*3/uL — AB (ref 4.0–10.5)

## 2015-12-23 LAB — BASIC METABOLIC PANEL
ANION GAP: 11 (ref 5–15)
BUN: 15 mg/dL (ref 6–20)
CO2: 23 mmol/L (ref 22–32)
Calcium: 9.2 mg/dL (ref 8.9–10.3)
Chloride: 107 mmol/L (ref 101–111)
Creatinine, Ser: 1.1 mg/dL (ref 0.61–1.24)
GFR calc Af Amer: >60 mL/min (ref >60)
GLUCOSE: 124 mg/dL — AB (ref 65–99)
POTASSIUM: 3.9 mmol/L (ref 3.5–5.1)
SODIUM: 141 mmol/L (ref 135–145)

## 2015-12-23 LAB — PROTIME-INR
INR: 1.34 (ref 0.00–1.49)
PROTHROMBIN TIME: 16.7 s — AB (ref 11.6–15.2)

## 2015-12-23 LAB — ABO/RH: ABO/RH(D): B POS

## 2015-12-23 MED ORDER — SODIUM CHLORIDE 0.9 % IV SOLN: INTRAVENOUS | Status: DC

## 2015-12-23 MED ORDER — HYDROMORPHONE HCL 1 MG/ML IJ SOLN
0.5000 mg | INTRAMUSCULAR | Status: DC | PRN
Start: 2015-12-23 — End: 2015-12-25
  Administered 2015-12-23 – 2015-12-24 (×3): 0.5 mg via INTRAVENOUS
  Filled 2015-12-23 (×3): qty 1

## 2015-12-23 MED ORDER — FAMOTIDINE 20 MG PO TABS
20.0000 mg | ORAL_TABLET | Freq: Two times a day (BID) | ORAL | Status: DC
  Administered 2015-12-23 – 2015-12-25 (×5): 20 mg via ORAL
  Filled 2015-12-23 (×5): qty 1

## 2015-12-23 MED ORDER — HEPARIN SODIUM (PORCINE) 5000 UNIT/ML IJ SOLN
5000.0000 [IU] | Freq: Three times a day (TID) | INTRAMUSCULAR | Status: DC
  Administered 2015-12-23 – 2015-12-25 (×6): 5000 [IU] via SUBCUTANEOUS
  Filled 2015-12-23 (×4): qty 1

## 2015-12-23 MED ORDER — IPRATROPIUM-ALBUTEROL 0.5-2.5 (3) MG/3ML IN SOLN: 3.0000 mL | RESPIRATORY_TRACT | Status: DC | PRN

## 2015-12-23 MED ORDER — BACLOFEN 10 MG PO TABS
10.0000 mg | ORAL_TABLET | Freq: Three times a day (TID) | ORAL | Status: DC | PRN
  Administered 2015-12-23: 10 mg via ORAL
  Filled 2015-12-23: qty 1

## 2015-12-23 MED ORDER — LEVOTHYROXINE SODIUM 112 MCG PO TABS
112.0000 ug | ORAL_TABLET | Freq: Every day | ORAL | Status: DC
  Administered 2015-12-23 – 2015-12-25 (×3): 112 ug via ORAL
  Filled 2015-12-23 (×4): qty 1

## 2015-12-23 MED ORDER — NIACIN ER 500 MG PO TBCR
500.0000 mg | EXTENDED_RELEASE_TABLET | Freq: Every morning | ORAL | Status: DC
  Administered 2015-12-23 – 2015-12-25 (×3): 500 mg via ORAL
  Filled 2015-12-23 (×3): qty 1

## 2015-12-23 MED ORDER — ASPIRIN 81 MG PO CHEW
81.0000 mg | CHEWABLE_TABLET | Freq: Every day | ORAL | Status: DC
  Administered 2015-12-23 – 2015-12-24 (×2): 81 mg via ORAL
  Filled 2015-12-23 (×2): qty 1

## 2015-12-23 MED ORDER — DOCUSATE SODIUM 100 MG PO CAPS
100.0000 mg | ORAL_CAPSULE | Freq: Two times a day (BID) | ORAL | Status: DC
  Administered 2015-12-23 – 2015-12-25 (×5): 100 mg via ORAL
  Filled 2015-12-23 (×5): qty 1

## 2015-12-23 MED ORDER — CYCLOBENZAPRINE HCL 10 MG PO TABS: 5.0000 mg | ORAL_TABLET | Freq: Once | ORAL | Status: DC

## 2015-12-23 MED ORDER — HYDROMORPHONE HCL 1 MG/ML IJ SOLN: 1.0000 mg | Freq: Once | INTRAMUSCULAR | Status: DC

## 2015-12-23 MED ORDER — HYDROMORPHONE HCL 1 MG/ML IJ SOLN
1.0000 mg | Freq: Once | INTRAMUSCULAR | Status: AC
Start: 2015-12-23 — End: 2015-12-23
  Administered 2015-12-23: 1 mg via INTRAVENOUS
  Filled 2015-12-23: qty 1

## 2015-12-23 MED ORDER — AMLODIPINE BESYLATE 10 MG PO TABS
10.0000 mg | ORAL_TABLET | Freq: Every morning | ORAL | Status: DC
  Administered 2015-12-23 – 2015-12-25 (×3): 10 mg via ORAL
  Filled 2015-12-23 (×3): qty 1

## 2015-12-23 MED ORDER — LABETALOL HCL 5 MG/ML IV SOLN
10.0000 mg | INTRAVENOUS | Status: DC | PRN
  Filled 2015-12-23: qty 4

## 2015-12-23 MED ORDER — METOPROLOL TARTRATE 25 MG PO TABS
25.0000 mg | ORAL_TABLET | Freq: Two times a day (BID) | ORAL | Status: DC
  Administered 2015-12-23 – 2015-12-25 (×5): 25 mg via ORAL
  Filled 2015-12-23 (×5): qty 1

## 2015-12-23 MED ORDER — ATORVASTATIN CALCIUM 80 MG PO TABS
80.0000 mg | ORAL_TABLET | Freq: Every morning | ORAL | Status: DC
  Administered 2015-12-23 – 2015-12-25 (×3): 80 mg via ORAL
  Filled 2015-12-23 (×3): qty 1

## 2015-12-23 MED ORDER — NITROGLYCERIN 0.4 MG SL SUBL: 0.4000 mg | SUBLINGUAL_TABLET | SUBLINGUAL | Status: DC | PRN

## 2015-12-23 MED ORDER — ACETAMINOPHEN 325 MG PO TABS: 650.0000 mg | ORAL_TABLET | ORAL | Status: DC | PRN

## 2015-12-23 MED ORDER — CYCLOBENZAPRINE HCL 10 MG PO TABS
5.0000 mg | ORAL_TABLET | Freq: Once | ORAL | Status: AC
  Administered 2015-12-23: 5 mg via ORAL
  Filled 2015-12-23: qty 1

## 2015-12-23 MED ORDER — ISOSORBIDE MONONITRATE ER 30 MG PO TB24
30.0000 mg | ORAL_TABLET | Freq: Every morning | ORAL | Status: DC
  Administered 2015-12-23 – 2015-12-25 (×3): 30 mg via ORAL
  Filled 2015-12-23 (×3): qty 1

## 2015-12-23 MED ORDER — OXYCODONE HCL 5 MG PO TABS
5.0000 mg | ORAL_TABLET | ORAL | Status: DC | PRN
  Administered 2015-12-23: 5 mg via ORAL
  Administered 2015-12-23 – 2015-12-24 (×4): 10 mg via ORAL
  Administered 2015-12-24: 5 mg via ORAL
  Administered 2015-12-25: 10 mg via ORAL
  Filled 2015-12-23 (×2): qty 2
  Filled 2015-12-23: qty 1
  Filled 2015-12-23 (×2): qty 2
  Filled 2015-12-23: qty 1
  Filled 2015-12-23: qty 2

## 2015-12-23 NOTE — ED Notes (Signed)
Attempted report to floor. Windell Mouldinguth. Charge nurse will call back.

## 2015-12-23 NOTE — ED Notes (Signed)
Foley catheter placed due to urinary retention. output immediately following insertion.

## 2015-12-23 NOTE — Consult Note (Signed)
Reason for Consult: Left hip fracture Referring Physician:  Thedore Mins, MD  Maxwell Copeland is an 82 y.o. male.  HPI: 82 yo male with significant cardiac history fell last evening after sitting for awhile.  Complains of left hip pain.  Brought to ER last night Orthopaedics consulted for management options for left hip  Past Medical History  Diagnosis Date  . Coronary artery disease     LAD w/ Ca++, 100% RCA, in-astent stenosis Cx, high grade lesion Ramus  . Hypercholesteremia   . Dyspnea   . Hypothyroid   . CVA (cerebral infarction)   . Arthritis     hips, will give out  . Varicella   . Depression     never took any medication.  . Emphysema of lung (HCC)     smoking related.  . Hypertension   . Stroke [facility])     left hemiparesis-100% recovery  . AAA (abdominal aortic aneurysm) (HCC)     small  . Severe aortic stenosis 2012    Past Surgical History  Procedure Laterality Date  . Coronary angioplasty with stent placement      Several stents, last in 2004    Family History  Problem Relation Age of Onset  . Coronary artery disease    . Hyperlipidemia Mother   . Heart disease Mother     had artifical valve  . Heart disease Brother   . Hyperlipidemia Brother   . COPD Brother   . Heart disease Brother     CABG, valve replacement  . Alcohol abuse Brother   . Heart attack Mother   . Hypertension Mother     Social History:  reports that he quit smoking about 28 years ago. He uses smokeless tobacco. He reports that he does not drink alcohol or use illicit drugs.  Allergies:  Allergies  Allergen Reactions  . Prednisone Palpitations    See 02/07/14 he feels this caused chest pain    Medications:  I have reviewed the patient's current medications. Scheduled: . amLODipine  10 mg Oral q morning - 10a  . aspirin  81 mg Oral Daily  . atorvastatin  80 mg Oral q morning - 10a  . docusate sodium  100 mg Oral BID  . famotidine  20 mg Oral BID  . isosorbide mononitrate  30  mg Oral q morning - 10a  . levothyroxine  112 mcg Oral QAC breakfast  . metoprolol  25 mg Oral BID  . niacin  500 mg Oral q morning - 10a    Results for orders placed or performed during the hospital encounter of 12/22/15 (from the past 24 hour(s))  Comprehensive metabolic panel     Status: Abnormal   Collection Time: 12/22/15  9:23 PM  Result Value Ref Range   Sodium 136 135 - 145 mmol/L   Potassium 3.8 3.5 - 5.1 mmol/L   Chloride 102 101 - 111 mmol/L   CO2 25 22 - 32 mmol/L   Glucose, Bld 105 (H) 65 - 99 mg/dL   BUN 19 6 - 20 mg/dL   Creatinine, Ser 1.61 (H) 0.61 - 1.24 mg/dL   Calcium 9.2 8.9 - 09.6 mg/dL   Total Protein 7.5 6.5 - 8.1 g/dL   Albumin 3.9 3.5 - 5.0 g/dL   AST 30 15 - 41 U/L   ALT 16 (L) 17 - 63 U/L   Alkaline Phosphatase 91 38 - 126 U/L   Total Bilirubin 0.7 0.3 - 1.2 mg/dL   GFR calc  non Af Amer 49 (L) >60 mL/min   GFR calc Af Amer 57 (L) >60 mL/min   Anion gap 9 5 - 15  CBC WITH DIFFERENTIAL     Status: Abnormal   Collection Time: 12/22/15  9:23 PM  Result Value Ref Range   WBC 7.1 4.0 - 10.5 K/uL   RBC 3.89 (L) 4.22 - 5.81 MIL/uL   Hemoglobin 11.7 (L) 13.0 - 17.0 g/dL   HCT 29.535.4 (L) 62.139.0 - 30.852.0 %   MCV 91.0 78.0 - 100.0 fL   MCH 30.1 26.0 - 34.0 pg   MCHC 33.1 30.0 - 36.0 g/dL   RDW 65.713.2 84.611.5 - 96.215.5 %   Platelets 167 150 - 400 K/uL   Neutrophils Relative % 59 %   Neutro Abs 4.2 1.7 - 7.7 K/uL   Lymphocytes Relative 23 %   Lymphs Abs 1.7 0.7 - 4.0 K/uL   Monocytes Relative 7 %   Monocytes Absolute 0.5 0.1 - 1.0 K/uL   Eosinophils Relative 11 %   Eosinophils Absolute 0.7 0.0 - 0.7 K/uL   Basophils Relative 0 %   Basophils Absolute 0.0 0.0 - 0.1 K/uL  Type and screen [facility]     Status: None   Collection Time: 12/22/15 10:40 PM  Result Value Ref Range   ABO/RH(D) B POS    Antibody Screen NEG    Sample Expiration 12/25/2015   ABO/Rh     Status: None (Preliminary result)   Collection Time: 12/22/15 10:40 PM  Result Value  Ref Range   ABO/RH(D) B POS   Urinalysis, Routine w reflex microscopic ([facility])     Status: None   Collection Time: 12/22/15 10:54 PM  Result Value Ref Range   Color, Urine YELLOW YELLOW   APPearance CLEAR CLEAR   Specific Gravity, Urine 1.011 1.005 - 1.030   pH 7.0 5.0 - 8.0   Glucose, UA NEGATIVE NEGATIVE mg/dL   Hgb urine dipstick NEGATIVE NEGATIVE   Bilirubin Urine NEGATIVE NEGATIVE   Ketones, ur NEGATIVE NEGATIVE mg/dL   Protein, ur NEGATIVE NEGATIVE mg/dL   Nitrite NEGATIVE NEGATIVE   Leukocytes, UA NEGATIVE NEGATIVE  CBC     Status: Abnormal   Collection Time: 12/23/15  2:59 AM  Result Value Ref Range   WBC 15.0 (H) 4.0 - 10.5 K/uL   RBC 3.92 (L) 4.22 - 5.81 MIL/uL   Hemoglobin 11.8 (L) 13.0 - 17.0 g/dL   HCT 95.235.4 (L) 84.139.0 - 32.452.0 %   MCV 90.3 78.0 - 100.0 fL   MCH 30.1 26.0 - 34.0 pg   MCHC 33.3 30.0 - 36.0 g/dL   RDW 40.113.2 02.711.5 - 25.315.5 %   Platelets 138 (L) 150 - 400 K/uL  Basic metabolic panel     Status: Abnormal   Collection Time: 12/23/15  2:59 AM  Result Value Ref Range   Sodium 141 135 - 145 mmol/L   Potassium 3.9 3.5 - 5.1 mmol/L   Chloride 107 101 - 111 mmol/L   CO2 23 22 - 32 mmol/L   Glucose, Bld 124 (H) 65 - 99 mg/dL   BUN 15 6 - 20 mg/dL   Creatinine, Ser 6.641.10 0.61 - 1.24 mg/dL   Calcium 9.2 8.9 - 40.310.3 mg/dL   GFR calc non Af Amer >60 >60 mL/min   GFR calc Af Amer >60 >60 mL/min   Anion gap 11 5 - 15    X-ray: CLINICAL DATA: Fall with left hip pain  EXAM:  DG HIP (WITH OR WITHOUT PELVIS) 2-3V LEFT  COMPARISON: None.  FINDINGS:  Comminuted nondisplaced intertrochanteric left proximal femur  fracture. No additional fracture. No dislocation or appreciable  arthropathy in the left hip. Diffuse osteopenia. Degenerative  changes in the visualized lower lumbar spine. Vascular  calcifications throughout the soft tissues. Prominent stool  throughout the visualized colon.  IMPRESSION:  Comminuted nondisplaced intertrochanteric left proximal femur   fracture. Diffuse osteopenia   ROS  Per H&P Chronic angina with use of Sl nitro  Blood pressure 163/84, pulse 101, temperature 98.8 F (37.1 C), temperature source Oral, resp. rate 20, height  (1.727 m), weight 68 kg (149 lb 14.6 oz), SpO2 94 %.  Physical Exam  Awake alert (daughter in room this am) Laying partially on his left side General: Sleeping but aroused to voice. Immediately in pain with muscle spasm  Head: [place]/AT  ENT: Mucous membranes are dry.  Cardiovascular: Mildly tachycardic but regular. Murmur present.  Respiratory: No significant wheeze ronchi.  GI: Abdomen is soft/NT/ND. Bowel sounds are present. No guarding.  Skin: Warm and dry.  Musculoskeletal: Trying not to move left leg secondary to pain, LEFT leg neutrally aligned  Psychiatric: Normal affect   Assessment/Plan: Left non-displaced intertrochanteric femur fracture  Based on cardiac history if decision on performing an operation to stabilize fracture is to be made Cardiology input will be imperative Please have Cardiology evaluate and comment on aortic stenosis and vessel disease as it pertains to risk assessment Treatment options reviewed and at this point will be deferred until Cardiology consult  Changed diet to regular Please notify me, [phone], about Cardiology recommendations  Shelda Pal 12/23/2015, 5:46 AM

## 2015-12-23 NOTE — Consult Note (Signed)
Reason for Consult: pre-op exam for fx hip  Referring Physician: Dr. Eulas Post and Dr. Alvan Dame   PCP:  Mauricio Po, [name]  Primary Cardiologist:Dr. Lennie Odor Maxwell Copeland is an 82 y.o. male.    Chief Complaint:  Admitted early AM 12/23/15 after standing and legs gave way + nondisplaced comminuted left proximal femur fracture  HPI:   We are asked to see 82 year old male with to risk stratify for surgery.  He has a history of CAD status post non-STEMI in 2012, severe aortic stenosis, HL, prior stroke, HTN, 60-79% stenosis of RICA, 38% LICA, PAD of lower ext.  AAA 3 X 2.7 in 2007.  Patient has refused CABG/AVR in the past. He was admitted 7/11-7/14 with a non-STEMI. Long discussions with the patient regarding surgical intervention of his severe AS in 2015. He was not interested in CABG or aortic valve replacement. He was not felt to be a candidate for TAVR secondary to severe CAD. Continued medical therapy has been recommended.  Last office visit was 04/2014.  He lives beside his daughter.    Pt does take NTG 3-5 times a week.  He can do ADLs without pain but much more exertion and he has angina.  But relief with NTG.  No syncope, he does get lightheaded with the chest pain at times.  No edema.  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    EKG SR with PVC, LVH, similar to EKG 2015. Lytes normal, Cr 1.10 today.  WBC 15 today, H/H 11.8/35.4.  CXR Pending  Last echo 03/2014  Study Conclusions  - Left ventricle: The cavity size was normal. Wall thickness was normal. Systolic function was normal. The estimated ejection fraction was in the range of 55% to 60%. Wall motion was normal; there were no regional wall motion abnormalities. Doppler parameters are consistent with abnormal left ventricular relaxation (grade 1 diastolic dysfunction). The E/e&' ratio is between 8-15, suggesting indeterminate LV filling pressure. - Aortic valve: Moderately calcified  aortic valve with restricted leaflet motion. There is severe aortic stenosis - peak and mean gradients of 64-73 and 35-40 mmHg, respectively. Based on an LVOT diameter of 1.9 cm, the calculated AVA is 0.6-0.7 cm2. There was mild to moderate regurgitation. Valve area (VTI): 0.65 cm^2. Valve area (Vmax): 0.77 cm^2. - Mitral valve: Calcified annulus. There was mild regurgitation. - Left atrium: Mildly dilated at 38 ml/m2.  Impressions:  - LVEF 55-60%, normal wall thickness, calcified aortic valve with severe stenosis, AVA 0.6-0.7 cm2, mild to moderate AI, diastolic dysfunction. dilated LA  Last cath 2012 Angiography: The left main has mild calcifications. There is no significant stenosis.  The left anterior descending artery is very calcified. There is a very long complex irregular stenosis involving the proximal mid LAD. This stenosis ranges between 75% and 80%. It involves the first diagonal vessel. The remaining mid and distal LAD has minor luminal irregularities.  The first diagonal artery has a 75%-80% stenosis in the proximal segment.  The left circumflex artery is large and dominant. There is a very high obtuse marginal artery that is subtotaled. It is very hazy. There is TIMI grade 3 flow down this vessel.  The proximal and mid stents are patent. There is a 40%-50% stenosis between these two stents. The distal LAD circumflex artery has a 70% stenosis in the proximal posterior descending artery. The remaining posterolateral branches are fairly normal.  The collateral filling  can be seen coming from the circumflex vessel to the right coronary artery.  Right coronary artery: The right coronary artery is very small and is nondominant. It is occluded at its mid segment. There are collaterals coming from the right-to-right system and also left-to-right collaterals.  No left ventriculogram was performed secondary to inability to cross  the valve. 1. Severe coronary artery disease. The patient has moderate-to-severe  coronary artery disease involving the left anterior descending  artery, left circumflex artery, and an occluded right coronary  artery. I do not think that these LAD and diagonal lesions are  amenable to the angioplasty/stenting. They are very long and  complex and also occur at a bifurcation. 2. The patient has severe aortic stenosis by echocardiography. 3. He has normal cardiac output by right heart cath. He has normal  right ventricular pressures by right heart cath.   Past Medical History  Diagnosis Date  . Coronary artery disease     LAD w/ Ca++, 100% RCA, in-astent stenosis Cx, high grade lesion Ramus  . Hypercholesteremia   . Dyspnea   . Hypothyroid   . CVA (cerebral infarction)   . Arthritis     hips, will give out  . Varicella   . Depression     never took any medication.  . Emphysema of lung ([place])     smoking related.  . Hypertension   . Stroke [facility])     left hemiparesis-100% recovery  . AAA (abdominal aortic aneurysm) (HCC)     small  . Severe aortic stenosis 2012    Past Surgical History  Procedure Laterality Date  . Coronary angioplasty with stent placement      Several stents, last in 2004    Family History  Problem Relation Age of Onset  . Coronary artery disease    . Hyperlipidemia Mother   . Heart disease Mother     had artifical valve  . Heart disease Brother   . Hyperlipidemia Brother   . COPD Brother   . Heart disease Brother     CABG, valve replacement  . Alcohol abuse Brother   . Heart attack Mother   . Hypertension Mother    Social History:  reports that he quit smoking about 28 years ago. He uses smokeless tobacco. He reports that he does not drink alcohol or use illicit drugs.  Allergies:  Allergies  Allergen Reactions  . Prednisone Palpitations    See 02/07/14 he feels this caused chest pain    OUTPATIENT MEDICATIONS: No  current facility-administered medications on file prior to encounter.   Current Outpatient Prescriptions on File Prior to Encounter  Medication Sig Dispense Refill  . acetaminophen (TYLENOL) 325 MG tablet Take 2 tablets (650 mg total) by mouth every 4 (four) hours as needed for mild pain, fever or headache.    Marland Kitchen amLODipine (NORVASC) 10 MG tablet Take 10 mg by mouth every morning.     Marland Kitchen atorvastatin (LIPITOR) 80 MG tablet Take 80 mg by mouth every morning.    . budesonide-formoterol (SYMBICORT) 160-4.5 MCG/ACT inhaler Inhale 2 puffs into the lungs 2 (two) times daily as needed (for shortness of breath). Reported on 09/13/2015 1 Inhaler 11  . clopidogrel (PLAVIX) 75 MG tablet Take 75 mg by mouth every morning.     . isosorbide mononitrate (IMDUR) 30 MG 24 hr tablet Take 30 mg by mouth every morning.    Marland Kitchen levothyroxine (SYNTHROID, LEVOTHROID) 112 MCG tablet Take 112 mcg by mouth daily before breakfast.     .  metoprolol (LOPRESSOR) 50 MG tablet Take 0.5 tablets (25 mg total) by mouth 2 (two) times daily.    . niacin (SLO-NIACIN) 500 MG tablet Take 500 mg by mouth every morning.     . nitroGLYCERIN (NITROSTAT) 0.4 MG SL tablet Place 1 tablet (0.4 mg total) under the tongue every 5 (five) minutes as needed for chest pain. 25 tablet 3  . ranitidine (ZANTAC) 150 MG tablet Take 150 mg by mouth every morning.      CURRENT MEDICATIONS: Scheduled Meds: . amLODipine  10 mg Oral q morning - 10a  . aspirin  81 mg Oral Daily  . atorvastatin  80 mg Oral q morning - 10a  . docusate sodium  100 mg Oral BID  . famotidine  20 mg Oral BID  . isosorbide mononitrate  30 mg Oral q morning - 10a  . levothyroxine  112 mcg Oral QAC breakfast  . metoprolol  25 mg Oral BID  . niacin  500 mg Oral q morning - 10a   Continuous Infusions:  PRN Meds:.acetaminophen, fentaNYL (SUBLIMAZE) injection, HYDROmorphone (DILAUDID) injection, ipratropium-albuterol, labetalol, nitroGLYCERIN, oxyCODONE  Results for orders placed or  performed during the hospital encounter of 12/22/15 (from the past 48 hour(s))  Comprehensive metabolic panel     Status: Abnormal   Collection Time: 12/22/15  9:23 PM  Result Value Ref Range   Sodium 136 135 - 145 mmol/L   Potassium 3.8 3.5 - 5.1 mmol/L   Chloride 102 101 - 111 mmol/L   CO2 25 22 - 32 mmol/L   Glucose, Bld 105 (H) 65 - 99 mg/dL   BUN 19 6 - 20 mg/dL   Creatinine, Ser 1.31 (H) 0.61 - 1.24 mg/dL   Calcium 9.2 8.9 - 10.3 mg/dL   Total Protein 7.5 6.5 - 8.1 g/dL   Albumin 3.9 3.5 - 5.0 g/dL   AST 30 15 - 41 U/L   ALT 16 (L) 17 - 63 U/L   Alkaline Phosphatase 91 38 - 126 U/L   Total Bilirubin 0.7 0.3 - 1.2 mg/dL   GFR calc non Af Amer 49 (L) >60 mL/min   GFR calc Af Amer 57 (L) >60 mL/min    Comment: (NOTE) The eGFR has been calculated using the CKD EPI equation. This calculation has not been validated in all clinical situations. eGFR's persistently <60 mL/min signify possible Chronic Kidney Disease.    Anion gap 9 5 - 15  CBC WITH DIFFERENTIAL     Status: Abnormal   Collection Time: 12/22/15  9:23 PM  Result Value Ref Range   WBC 7.1 4.0 - 10.5 K/uL   RBC 3.89 (L) 4.22 - 5.81 MIL/uL   Hemoglobin 11.7 (L) 13.0 - 17.0 g/dL   HCT 35.4 (L) 39.0 - 52.0 %   MCV 91.0 78.0 - 100.0 fL   MCH 30.1 26.0 - 34.0 pg   MCHC 33.1 30.0 - 36.0 g/dL   RDW 13.2 11.5 - 15.5 %   Platelets 167 150 - 400 K/uL   Neutrophils Relative % 59 %   Neutro Abs 4.2 1.7 - 7.7 K/uL   Lymphocytes Relative 23 %   Lymphs Abs 1.7 0.7 - 4.0 K/uL   Monocytes Relative 7 %   Monocytes Absolute 0.5 0.1 - 1.0 K/uL   Eosinophils Relative 11 %   Eosinophils Absolute 0.7 0.0 - 0.7 K/uL   Basophils Relative 0 %   Basophils Absolute 0.0 0.0 - 0.1 K/uL  Type and screen [place]  Status: None   Collection Time: 12/22/15 10:40 PM  Result Value Ref Range   ABO/RH(D) B POS    Antibody Screen NEG    Sample Expiration 12/25/2015   ABO/Rh     Status: None (Preliminary result)    Collection Time: 12/22/15 10:40 PM  Result Value Ref Range   ABO/RH(D) B POS   Urinalysis, Routine w reflex microscopic (not at [facility])     Status: None   Collection Time: 12/22/15 10:54 PM  Result Value Ref Range   Color, Urine YELLOW YELLOW   APPearance CLEAR CLEAR   Specific Gravity, Urine 1.011 1.005 - 1.030   pH 7.0 5.0 - 8.0   Glucose, UA NEGATIVE NEGATIVE mg/dL   Hgb urine dipstick NEGATIVE NEGATIVE   Bilirubin Urine NEGATIVE NEGATIVE   Ketones, ur NEGATIVE NEGATIVE mg/dL   Protein, ur NEGATIVE NEGATIVE mg/dL   Nitrite NEGATIVE NEGATIVE   Leukocytes, UA NEGATIVE NEGATIVE    Comment: MICROSCOPIC NOT DONE ON URINES WITH NEGATIVE PROTEIN, BLOOD, LEUKOCYTES, NITRITE, OR GLUCOSE <1000 mg/dL.  CBC     Status: Abnormal   Collection Time: 12/23/15  2:59 AM  Result Value Ref Range   WBC 15.0 (H) 4.0 - 10.5 K/uL   RBC 3.92 (L) 4.22 - 5.81 MIL/uL   Hemoglobin 11.8 (L) 13.0 - 17.0 g/dL   HCT 35.4 (L) 39.0 - 52.0 %   MCV 90.3 78.0 - 100.0 fL   MCH 30.1 26.0 - 34.0 pg   MCHC 33.3 30.0 - 36.0 g/dL   RDW 13.2 11.5 - 15.5 %   Platelets 138 (L) 150 - 400 K/uL  Basic metabolic panel     Status: Abnormal   Collection Time: 12/23/15  2:59 AM  Result Value Ref Range   Sodium 141 135 - 145 mmol/L   Potassium 3.9 3.5 - 5.1 mmol/L   Chloride 107 101 - 111 mmol/L   CO2 23 22 - 32 mmol/L   Glucose, Bld 124 (H) 65 - 99 mg/dL   BUN 15 6 - 20 mg/dL   Creatinine, Ser 1.10 0.61 - 1.24 mg/dL   Calcium 9.2 8.9 - 10.3 mg/dL   GFR calc non Af Amer >60 >60 mL/min   GFR calc Af Amer >60 >60 mL/min    Comment: (NOTE) The eGFR has been calculated using the CKD EPI equation. This calculation has not been validated in all clinical situations. eGFR's persistently <60 mL/min signify possible Chronic Kidney Disease.    Anion gap 11 5 - 15   Dg Hip Unilat With Pelvis 2-3 Views Left  12/22/2015  CLINICAL DATA:  Fall with left hip pain EXAM: DG HIP (WITH OR WITHOUT PELVIS) 2-3V LEFT COMPARISON:  None.  FINDINGS: Comminuted nondisplaced intertrochanteric left proximal femur fracture. No additional fracture. No dislocation or appreciable arthropathy in the left hip. Diffuse osteopenia. Degenerative changes in the visualized lower lumbar spine. Vascular calcifications throughout the soft tissues. Prominent stool throughout the visualized colon. IMPRESSION: Comminuted nondisplaced intertrochanteric left proximal femur fracture. Diffuse osteopenia. Electronically Signed   By: Ilona Sorrel M.D.   On: 12/22/2015 22:00    ROS: General:no colds or fevers, no weight changes Skin:no rashes or ulcers HEENT:no blurred vision, no congestion CV:see HPI PUL:see HPI--COPD GI:no diarrhea constipation or melena, no indigestion GU:no hematuria, no dysuria MS:no joint pain, no claudication Neuro:no syncope, no lightheadedness Endo:no diabetes, + thyroid disease on synthroid   Blood pressure 163/84, pulse 101, temperature 98.8 F (37.1 C), temperature source Oral, resp. rate 20, height 5'  8" (1.727 m), weight 149 lb 14.6 oz (68 kg), SpO2 94 %.  Wt Readings from Last 3 Encounters:  12/23/15 149 lb 14.6 oz (68 kg)  11/03/15 146 lb 8 oz (66.452 kg)  09/26/15 147 lb 12.8 oz (67.042 kg)    PE: General:Pleasant affect, NAD Skin:Warm and dry, brisk capillary refill HEENT:normocephalic, sclera clear, mucus membranes moist Neck:supple, mild JVD, no bruits  Heart:S1S2 RRR with 3/6 aortic murmur heard throughout, no gallup, rub or click Lungs:clear-ant.  without rales, rhonchi, or wheezes ANV:BTYO, non tender, + BS, do not palpate liver spleen or masses Ext:no lower ext edema, ? Pedal pulses.  2+ radial pulses Neuro:alert and oriented X 3, MAE, follows commands, + facial symmetry    Assessment/Plan Active Problems:   Closed left hip fracture (HCC)   Hip fracture (HCC)  Severe AS in 2015  CAD mod to severe in 2012 pt has refused CABG or AVR though he is not candidate for TAVR.  ----+ symptomatic with  exertional angina may need Echo to eval AS.  Dr. Harrington Challenger to see for further plan.      PAD of lower ext and carotid disease.  AAA 3 X 2.7 in 2007 and 2014 2.9 X 2.9  HTN  Elevated today   MAYOKH,TXHFS R  Nurse Practitioner Certified [place] Pager [phone] or after 5pm or weekends call [phone] 12/23/2015, 7:17 AM    Pt seen and examined  Agree with findings as noted by Dot Been above Pt is an 83 yo with severe CAD (not revascularized) and severe aortic stenosis.   Pt has intermitt chest tightness (angina) that is relieved with NTG  None at rest  No CHF  No syncope Now with hip fx after fall  Denies syncope ON exam Lungs are CTA  Neck ? Left bruit vs radiating murmur  Cardiac RRR  III/VI later peakng systolic murmur base  Ext  No edema   EKG"  SR with LVH and repol abnormality   Based on above, pt is at very high risk for cardiac complications (ischemia, CHF) in the periop period  Acutely there is nothing that can decrease this risk   For now would recomm repeat echo to reeval LV function and aortic valve  Strict I/O   PT currently getting IV fluids at 75 cc/hour    Dorris Carnes

## 2015-12-23 NOTE — Progress Notes (Signed)
  Echocardiogram 2D Echocardiogram has been performed.  Maxwell Copeland, Maxwell Copeland 12/23/2015, 9:58 AM

## 2015-12-23 NOTE — H&P (Signed)
[facility] History and Physical  HOPE HOLST WJX:[id number] DOB: [birth year] DOA: 12/22/2015  PCP: [name], [name]   Chief Complaint: Fall, left hip fracture, intractable pain  HPI: Maxwell Copeland is a 82 y.o. gentleman with a history of severe CAD and aortic stenosis (he declined CABG/AVR in the past, diastolic dysfunction on last echo in 2015, and prior CVA who presents for evaluation after having a mechanical fall at home.  He denies preceding chest pain, shortness of breath, or syncope.  He thinks that he may have been in a seated position too long, and his legs "gave out" when he got up from his seated position.  He has his cell phone on his hip, and he immediately called his daughter, who lives nearby.  She came over and called 911 for transport to the ED.  Unfortunately, he has sustained a nondisplaced comminuted left proximal femur fracture.  Ortho consult called to Dr. Charlann Boxer from the ED.  Hospitalist asked to admit.  The patient is having active left hip pain and muscle spasm.  He cannot give a  ROS at this time.  However, review of his medical record suggests that he has chronic angina (uses SL nitroglycerin regularly).  No obvious leg swelling to suggest decompensated heart failure.  Last echo in this system in 2015.  Last cardiology note in this system was also 2015.  Last two outpatient visits in this system were with his PCP.  Review of Systems: Unable to obtain.  Past Medical History  Diagnosis Date  . Coronary artery disease     LAD w/ Ca++, 100% RCA, in-astent stenosis Cx, high grade lesion Ramus  . Hypercholesteremia   . Dyspnea   . Hypothyroid   . CVA (cerebral infarction)   . Arthritis     hips, will give out  . Varicella   . Depression     never took any medication.  . Emphysema of lung (HCC)     smoking related.  . Hypertension   . Stroke [facility])     left hemiparesis-100% recovery  . AAA (abdominal aortic aneurysm) (HCC)     small  . Severe  aortic stenosis 2012   Past Surgical History  Procedure Laterality Date  . Coronary angioplasty with stent placement      Several stents, last in 2004   Social History:  Social History   Social History Narrative   From [place]. Lived [place] since 1960's. Married 47 years - widowed. 2 dtrs. Retired. Lives alone and independently but his dtr is close by.  No tobacco, EtOH, or illicit drug use.  Lives independently.  Ambulated independently prior to fall.  Allergies  Allergen Reactions  . Prednisone Palpitations    See 02/07/14 he feels this caused chest pain    Family History  Problem Relation Age of Onset  . Coronary artery disease    . Hyperlipidemia Mother   . Heart disease Mother     had artifical valve  . Heart disease Brother   . Hyperlipidemia Brother   . COPD Brother   . Heart disease Brother     CABG, valve replacement  . Alcohol abuse Brother   . Heart attack Mother   . Hypertension Mother    Prior to Admission medications   Medication Sig Start Date End Date Taking? Authorizing Provider  acetaminophen (TYLENOL) 325 MG tablet Take 2 tablets (650 mg total) by mouth every 4 (four) hours as needed for mild pain, fever or headache. 03/21/14  Yes Eda Paschal Kilroy, PA-C  amLODipine (NORVASC) 10 MG tablet Take 10 mg by mouth every morning.    Yes Historical Provider, MD  aspirin 325 MG tablet Take 325 mg by mouth daily.   Yes Historical Provider, MD  atorvastatin (LIPITOR) 80 MG tablet Take 80 mg by mouth every morning.   Yes Historical Provider, MD  budesonide-formoterol (SYMBICORT) 160-4.5 MCG/ACT inhaler Inhale 2 puffs into the lungs 2 (two) times daily as needed (for shortness of breath). Reported on 09/13/2015 09/13/15  Yes Renee A Kuneff, DO  clopidogrel (PLAVIX) 75 MG tablet Take 75 mg by mouth every morning.    Yes Historical Provider, MD  isosorbide mononitrate (IMDUR) 30 MG 24 hr tablet Take 30 mg by mouth every morning.   Yes Historical Provider, MD  levothyroxine  (SYNTHROID, LEVOTHROID) 112 MCG tablet Take 112 mcg by mouth daily before breakfast.    Yes Historical Provider, MD  metoprolol (LOPRESSOR) 50 MG tablet Take 0.5 tablets (25 mg total) by mouth 2 (two) times daily. 03/21/14  Yes Luke K Kilroy, PA-C  niacin (SLO-NIACIN) 500 MG tablet Take 500 mg by mouth every morning.    Yes Historical Provider, MD  nitroGLYCERIN (NITROSTAT) 0.4 MG SL tablet Place 1 tablet (0.4 mg total) under the tongue every 5 (five) minutes as needed for chest pain. 04/13/14  Yes Pecola Lawless, MD  ranitidine (ZANTAC) 150 MG tablet Take 150 mg by mouth every morning.    Yes Historical Provider, MD  vitamin E 400 UNIT capsule Take 800 Units by mouth daily.   Yes Historical Provider, MD   Physical Exam: Filed Vitals:   12/23/15 0015 12/23/15 0100 12/23/15 0130 12/23/15 0214  BP: 168/96 147/70 155/76 163/84  Pulse:  90 121 101  Temp:    98.8 F (37.1 C)  TempSrc:    Oral  Resp: Height:     (1.727 m)  Weight:    68 kg (149 lb 14.6 oz)  SpO2:  90% 94% 94%     General:  Sleeping but aroused to voice.  Immediately in pain with muscle spasm  Head: [place]/AT  ENT: Mucous membranes are dry.  Cardiovascular: Mildly tachycardic but regular.  Murmur present.  Respiratory: No significant wheeze ronchi.  GI: Abdomen is soft/NT/ND.  Bowel sounds are present.  No guarding.  Skin: Warm and dry.  Musculoskeletal: Trying not to move left leg secondary to pain.  Psychiatric: Normal affect.  Labs on Admission:  Basic Metabolic Panel:  Recent Labs Lab 12/22/15 2123  NA 136  K 3.8  CL 102  CO2 25  GLUCOSE 105*  BUN 19  CREATININE 1.31*  CALCIUM 9.2   Liver Function Tests:  Recent Labs Lab 12/22/15 2123  AST 30  ALT 16*  ALKPHOS 91  BILITOT 0.7  PROT 7.5  ALBUMIN 3.9   CBC:  Recent Labs Lab 12/22/15 2123  WBC 7.1  NEUTROABS 4.2  HGB 11.7*  HCT 35.4*  MCV 91.0  PLT 167   Radiological Exams on Admission: Dg Hip Unilat With Pelvis  2-3 Views Left  12/22/2015  CLINICAL DATA:  Fall with left hip pain EXAM: DG HIP (WITH OR WITHOUT PELVIS) 2-3V LEFT COMPARISON:  None. FINDINGS: Comminuted nondisplaced intertrochanteric left proximal femur fracture. No additional fracture. No dislocation or appreciable arthropathy in the left hip. Diffuse osteopenia. Degenerative changes in the visualized lower lumbar spine. Vascular calcifications throughout the soft tissues. Prominent stool throughout the visualized colon. IMPRESSION: Comminuted nondisplaced  intertrochanteric left proximal femur fracture. Diffuse osteopenia. Electronically Signed   By: Delbert PhenixJason A Poff M.D.   On: 12/22/2015 22:00   EKG: Independently reviewed. NSR.  LVH.  Assessment/Plan Active Problems:   Closed left hip fracture (HCC)   Hip fracture (HCC)  Admit to telemetry  Left hip fracture --patient will be high risk for perioperative cardiac complications due to his history of angina, severe CAD, aortic stenosis, and prior CVA.  I have notified the Ortho PA on call regarding this assessment.  Defer decision for the OR to Dr. [name]. --Pre-op chest xray ordered.  Consider repeat echo to assess EF, valvular heart disease. --NPO now  CAD with chronic angina, history of CVA --Continue baby aspirin but hold plavix for now until it is known if patient will go to the OR --SL NTG prn for chest pain  Mild AKI --Cautious hydration given history of aortic stenosis.  It appears that first order for IV fluids was given in the around 9pm and there was a subsequent rate adjustment.  Will give NS at 75cc/hr until 7AM for roughly 10 hours of hydration and repeat BMP in the AM.  Accelerated HTN, secondary to acute pain --Continue home medications --PRN IV labetalol for systolic BP greater than 170  Code Status: FULL CODE Family Communication: Daughter at bedside Disposition Plan: Expect him to be here at least two midnights  Time spent: 45 minutes  Constellation BrandsCarter,Tanyon Alipio Harrison [facility]  12/23/2015, 2:30 AM

## 2015-12-23 NOTE — Progress Notes (Signed)
PROGRESS NOTE                                                                                                                                                                                                             Patient Demographics:    Maxwell Copeland, is a 82 y.o. male, DOB - 09/30/1931, ZOX:[id number]  Admit date - 12/22/2015   Admitting Physician Michael LitterNikki Carter, MD  Outpatient Primary MD for the patient is Jeanine Luzalone, [name], [name]  LOS - 0  Outpatient Specialists   Chief Complaint  Patient presents with  . Fall  . Hip Pain       Brief Narrative      Subjective:    Maxwell Copeland today has, No headache, No chest pain, No abdominal pain - No Nausea, No new weakness tingling or numbness, No Cough - SOB. Mild left hip pain   Assessment  & Plan :     1.Mechanical fall with left femoral fracture. Orthopedics Dr. [name] Following, discussed the case with him, patient obviously is a high-risk candidate for adverse cardiopulmonary outcome during perioperative period, patient clearly informed same. Cardiology on board as well. Orthopedics will have detailed discussion with patient and family tomorrow morning and decide on eventual management.  2. CAD, moderate to severe as. EF 60%. Patient had declined left heart catheterization and stent placement 1 year ago, he currently has no chest pain at rest or shortness of breath, however he is high risk candidate for adverse cardio pulmonary outcome. Continue beta blocker as tolerated. Keep hemoglobin above 8, minimize exposure to anesthesia, minimize preload reduction, cardiology on board.  3. Hypertension. Continue home medications along with as needed IV labetalol.  4. Leukocytosis. Likely reactionary from #1 above. UA and chest x-ray unremarkable. Afebrile. Monitor.  5. History of present illness. Resolved with hydration.   Code Status : Full  Family Communication  :  None present  Disposition Plan  : Likely SNF  Barriers For Discharge : Left hip fracture  Consults  :  Cards, ortho  Procedures  :   TTE -  Compared to 2015, there is little progression of aortic stenosis  (severity was previously overestimated due to underestimation of  LVOT diameter) and overall LV systolic function remains normal.  However, new regional wall motion abnormalities are seen. EF 60%.   DVT Prophylaxis  :  Heparin    Lab Results  Component Value Date   PLT 138* 12/23/2015    Antibiotics  :    Anti-infectives    None        Objective:   Filed Vitals:   12/23/15 0015 12/23/15 0100 12/23/15 0130 12/23/15 0214  BP: 168/96 147/70 155/76 163/84  Pulse:  90 121 101  Temp:    98.8 F (37.1 C)  TempSrc:    Oral  Resp: Height:     (1.727 m)  Weight:    68 kg (149 lb 14.6 oz)  SpO2:  90% 94% 94%    Wt Readings from Last 3 Encounters:  12/23/15 68 kg (149 lb 14.6 oz)  11/03/15 66.452 kg (146 lb 8 oz)  09/26/15 67.042 kg (147 lb 12.8 oz)     Intake/Output Summary (Last 24 hours) at 12/23/15 1352 Last data filed at 12/23/15 0802  Gross per 24 hour  Intake      0 ml  Output   1550 ml  Net  -1550 ml     Physical Exam  Awake Alert, Oriented X 3, No new F.N deficits, Normal affect [place].AT,PERRAL Supple Neck,No JVD, No cervical lymphadenopathy appriciated.  Symmetrical Chest wall movement, Good air movement bilaterally, CTAB RRR,No Gallops,Rubs, +ve aortic systolic Murmur, No Parasternal Heave +ve B.Sounds, Abd Soft, No tenderness, No organomegaly appriciated, No rebound - guarding or rigidity. No Cyanosis, Clubbing or edema, No new Rash or bruise       Data Review:    CBC  Recent Labs Lab 12/22/15 2123 12/23/15 0259  WBC 7.1 15.0*  HGB 11.7* 11.8*  HCT 35.4* 35.4*  PLT 167 138*  MCV 91.0 90.3  MCH 30.1 30.1  MCHC 33.1 33.3  RDW 13.2 13.2  LYMPHSABS 1.7  --   MONOABS 0.5  --   EOSABS 0.7  --   BASOSABS 0.0  --      Chemistries   Recent Labs Lab 12/22/15 2123 12/23/15 0259  NA 136 141  K 3.8 3.9  CL 102 107  CO2 25 23  GLUCOSE 105* 124*  BUN 19 15  CREATININE 1.31* 1.10  CALCIUM 9.2 9.2  AST 30  --   ALT 16*  --   ALKPHOS 91  --   BILITOT 0.7  --    ------------------------------------------------------------------------------------------------------------------ No results for input(s): CHOL, HDL, LDLCALC, TRIG, CHOLHDL, LDLDIRECT in the last 72 hours.  No results found for: HGBA1C ------------------------------------------------------------------------------------------------------------------ No results for input(s): TSH, T4TOTAL, T3FREE, THYROIDAB in the last 72 hours.  Invalid input(s): FREET3 ------------------------------------------------------------------------------------------------------------------ No results for input(s): VITAMINB12, FOLATE, FERRITIN, TIBC, IRON, RETICCTPCT in the last 72 hours.  Coagulation profile  Recent Labs Lab 12/23/15 1030  INR 1.34    No results for input(s): DDIMER in the last 72 hours.  Cardiac Enzymes No results for input(s): CKMB, TROPONINI, MYOGLOBIN in the last 168 hours.  Invalid input(s): CK ------------------------------------------------------------------------------------------------------------------ No results found for: BNP  Inpatient Medications  Scheduled Meds: . amLODipine  10 mg Oral q morning - 10a  . aspirin  81 mg Oral Daily  . atorvastatin  80 mg Oral q morning - 10a  . docusate sodium  100 mg Oral BID  . famotidine  20 mg Oral BID  . isosorbide mononitrate  30 mg Oral q morning - 10a  . levothyroxine  112 mcg Oral QAC breakfast  . metoprolol  25 mg Oral BID  . niacin  500 mg Oral q morning -  10a   Continuous Infusions:  PRN Meds:.acetaminophen, fentaNYL (SUBLIMAZE) injection, HYDROmorphone (DILAUDID) injection, ipratropium-albuterol, labetalol, nitroGLYCERIN, oxyCODONE  Micro Results No results  found for this or any previous visit (from the past 240 hour(s)).  Radiology Reports Dg Chest Port 1 View  12/23/2015  CLINICAL DATA:  Pre op for right hip Fx surgery Hx of HTN, CVA, coronary artery disease, emphysema of lung, AAA EXAM: PORTABLE CHEST 1 VIEW COMPARISON:  09/13/2015 FINDINGS: Patient rotated to the left. Midline trachea. Cardiomegaly accentuated by AP portable technique. Atherosclerosis in the transverse aorta. No pleural effusion or pneumothorax. Left infrahilar airspace disease. Clear right lung. IMPRESSION: Oblique portable radiograph demonstrating left infrahilar airspace disease. This could represent mild scarring, accentuated by low lung volumes and obliquity. If there are cardiopulmonary symptoms, recommend preop PA and lateral radiographs. If not, this could be re-evaluated with nonemergent outpatient PA and lateral films. Cardiomegaly without congestive failure. These results will be called to the ordering clinician or representative by the Radiologist Assistant, and communication documented in the PACS or zVision Dashboard. Electronically Signed   By: Jeronimo Greaves M.D.   On: 12/23/2015 10:18   Dg Hip Unilat With Pelvis 2-3 Views Left  12/22/2015  CLINICAL DATA:  Fall with left hip pain EXAM: DG HIP (WITH OR WITHOUT PELVIS) 2-3V LEFT COMPARISON:  None. FINDINGS: Comminuted nondisplaced intertrochanteric left proximal femur fracture. No additional fracture. No dislocation or appreciable arthropathy in the left hip. Diffuse osteopenia. Degenerative changes in the visualized lower lumbar spine. Vascular calcifications throughout the soft tissues. Prominent stool throughout the visualized colon. IMPRESSION: Comminuted nondisplaced intertrochanteric left proximal femur fracture. Diffuse osteopenia. Electronically Signed   By: Delbert Phenix M.D.   On: 12/22/2015 22:00    Time Spent in minutes  25   Tashauna Caisse K M.D on 12/23/2015 at 1:52 PM  Between 7am to 7pm - Pager -  [phone]  After 7pm go to [email or web address] - password [facility]  [facility] -  Office  [phone]

## 2015-12-24 ENCOUNTER — Encounter (HOSPITAL_COMMUNITY)
Admission: EM | Disposition: A | Discharge status: Skilled Nursing Facility | Payer: Self-pay | Source: Home / Self Care | Attending: Internal Medicine

## 2015-12-24 LAB — CBC
HEMATOCRIT: 35.9 % — AB (ref 39.0–52.0)
Hemoglobin: 11.8 g/dL — ABNORMAL LOW (ref 13.0–17.0)
MCH: 30.2 pg (ref 26.0–34.0)
MCHC: 32.9 g/dL (ref 30.0–36.0)
MCV: 91.8 fL (ref 78.0–100.0)
PLATELETS: 132 10*3/uL — AB (ref 150–400)
RBC: 3.91 MIL/uL — ABNORMAL LOW (ref 4.22–5.81)
RDW: 13.4 % (ref 11.5–15.5)
WBC: 8.7 10*3/uL (ref 4.0–10.5)

## 2015-12-24 LAB — BASIC METABOLIC PANEL
ANION GAP: 10 (ref 5–15)
BUN: 16 mg/dL (ref 6–20)
CO2: 24 mmol/L (ref 22–32)
CREATININE: 1.29 mg/dL — AB (ref 0.61–1.24)
Calcium: 9.1 mg/dL (ref 8.9–10.3)
Chloride: 105 mmol/L (ref 101–111)
GFR calc Af Amer: 58 mL/min — ABNORMAL LOW (ref >60)
GFR, EST NON AFRICAN AMERICAN: 50 mL/min — AB (ref >60)
GLUCOSE: 99 mg/dL (ref 65–99)
Potassium: 4 mmol/L (ref 3.5–5.1)
Sodium: 139 mmol/L (ref 135–145)

## 2015-12-24 LAB — URINE CULTURE

## 2015-12-24 SURGERY — FIXATION, FRACTURE, INTERTROCHANTERIC, WITH INTRAMEDULLARY ROD
Anesthesia: General | Laterality: Left

## 2015-12-24 NOTE — Evaluation (Addendum)
Physical Therapy Evaluation Patient Details Name: Maxwell Copeland MRN: [id number] DOB: 1932/06/16 Today's Date: 12/24/2015   History of Present Illness  Maxwell Copeland is a 82 y.o. gentleman with a history of severe CAD and aortic stenosis (he declined CABG/AVR in the past, diastolic dysfunction on last echo in 2015, and prior CVA who presents for evaluation after having a mechanical fall at home.  Clinical Impression  Patient presents with decreased independence with mobility due to deficits listed in PT problem list below.  He will benefit from skilled PT in the acute setting to facilitate d/c to SNF level rehab. Patient with poor sitting balance/tolerance, did not feel safe for OOB at this time due to NWB status, but would benefit from skilled PT in SNF setting to maximize mobility and independence.    Follow Up Recommendations SNF    Equipment Recommendations  Other (comment) (TBA)    Recommendations for Other Services       Precautions / Restrictions Precautions Precautions: Fall Restrictions Weight Bearing Restrictions: Yes LLE Weight Bearing: Non weight bearing      Mobility  Bed Mobility Overal bed mobility: Needs Assistance Bed Mobility: Supine to Sit;Sit to Supine     Supine to sit: Max assist Sit to supine: +2 for physical assistance;Max assist   General bed mobility comments: assist to slowly bring legs off EOB and to lift trunk upright; assist for legs and trunk to supine and to scoot to [facility], pt attempting to assist with UE's, pushed up to [facility] with R LE  Transfers                 General transfer comment: NT, pt nauseated and too weak sitting EOB  Ambulation/Gait                Stairs            Wheelchair Mobility    Modified Rankin (Stroke Patients Only)       Balance Overall balance assessment: Needs assistance Sitting-balance support: Feet supported Sitting balance-Leahy Scale: Poor Sitting balance - Comments: flexed  trunk needing at least min support for balance, unable to extend trunk, allowed pt to lean back on therapist for support due to not feeling well.                                       Pertinent Vitals/Pain Pain Assessment: Faces Faces Pain Scale: Hurts whole lot Pain Location: L hip with movement Pain Descriptors / Indicators: Grimacing;Guarding;Sharp Pain Intervention(s): Repositioned;Monitored during session;Limited activity within patient's tolerance;Premedicated before session    Home Living Family/patient expects to be discharged to:: Skilled nursing facility                      Prior Function Level of Independence: Independent with assistive device(s)               Hand Dominance   Dominant Hand: Right    Extremity/Trunk Assessment   Upper Extremity Assessment: Generalized weakness           Lower Extremity Assessment: RLE deficits/detail;LLE deficits/detail RLE Deficits / Details: AAROM WFL, strength grossly 3+/5 LLE Deficits / Details: AAROM limited holds hip in flexion and external rotation, limited by pain  Cervical / Trunk Assessment: Kyphotic  Communication   Communication: HOH  Cognition Arousal/Alertness: Awake/alert Behavior During Therapy: WFL for tasks assessed/performed Overall Cognitive Status: Impaired/Different  from baseline Area of Impairment: Problem solving;Following commands;Orientation Orientation Level: Place;Time     Following Commands: Follows one step commands with increased time;Follows one step commands inconsistently     Problem Solving: Slow processing;Decreased initiation;Requires tactile cues;Requires verbal cues      General Comments General comments (skin integrity, edema, etc.): VSS in sitting HR 93, SpO2 96% on RA BP 117/67    Exercises Total Joint Exercises Ankle Circles/Pumps: AROM;Both;5 reps;Supine Quad Sets: AROM;Left;Other reps (comment);Supine (2)      Assessment/Plan    PT  Assessment Patient needs continued PT services  PT Diagnosis Generalized weakness;Acute pain   PT Problem List Decreased strength;Decreased knowledge of use of DME;Decreased range of motion;Decreased activity tolerance;Decreased balance;Decreased mobility;Pain;Decreased knowledge of precautions;Decreased cognition  PT Treatment Interventions DME instruction;Balance training;Functional mobility training;Patient/family education;Therapeutic activities;Therapeutic exercise   PT Goals (Current goals can be found in the Care Plan section) Acute Rehab PT Goals Patient Stated Goal: To go to rehab PT Goal Formulation: With patient/family Time For Goal Achievement: 01/07/16 Potential to Achieve Goals: Fair    Frequency Min 3X/week   Barriers to discharge        Co-evaluation               End of Session   Activity Tolerance: Patient limited by fatigue;Patient limited by pain Patient left: with call bell/phone within reach;in bed;with family/visitor present           Time: 1530-1555 PT Time Calculation (min) (ACUTE ONLY): 25 min   Charges:   PT Evaluation $PT Eval High Complexity: 1 Procedure PT Treatments $Therapeutic Activity: 8-22 mins   PT G CodesElray Mcgregor:        Boluwatife Mutchler 12/24/2015, 4:17 PM  Sheran Lawlessyndi Elbert Polyakov, PT [phone] 12/24/2015

## 2015-12-24 NOTE — Progress Notes (Signed)
Utilization review completed.  

## 2015-12-24 NOTE — Progress Notes (Signed)
Patient ID: Maxwell ShipperRichard C Buchan, male   DOB: 05/19/1932, 82 y.o.   MRN: [id number]  Doing fine no events Cardiology assess recent Echo, feel he is moderate to high risk and provided recommendations  At this point in time family and patient have decided to try and treat this non- operatively.    Reviewed risks of fracture displacement, instructions for NWB left lower extremity Canceled surgery for this pm Changed diet PT order placed Will need SNF Follow up in 4 weeks

## 2015-12-24 NOTE — Progress Notes (Signed)
PROGRESS NOTE                                                                                                                                                                                                             Patient Demographics:    Maxwell Copeland, is a 82 y.o. male, DOB - 12/07/1931, BJY:[id number]  Admit date - 12/22/2015   Admitting Physician Michael LitterNikki Carter, MD  Outpatient Primary MD for the patient is Jeanine Luzalone, [name], [name]  LOS - 1  Outpatient Specialists   Chief Complaint  Patient presents with  . Fall  . Hip Pain       Brief Narrative      Subjective:    Maxwell Copeland today has, No headache, No chest pain, No abdominal pain - No Nausea, No new weakness tingling or numbness, No Cough - SOB. Mild left hip pain   Assessment  & Plan :     1.Mechanical fall with left femoral fracture. Orthopedics Dr. Nadine Countslin FollowingAlong with cardiology, due to patient's history of underlying CAD, wall motion abnormality on echo, moderate to severe aortic stenosis it was decided that patient will be a high-risk candidate for adverse cardio pulmonary outcome from the operative standpoint. Patient and family have decided nonoperative management. I discussed his case with Dr. [name] who had extensive discussions with patient's family on 12/24/2015.  Orthopedic recommendations are pain control, PT evaluation with no weightbearing on the left leg for at least 8 weeks, SNF placement and outpatient follow-up. Patient's family bedside had already voiced this preference this morning to me.  2. CAD, moderate to severe as. EF 60%. Patient had declined left heart catheterization and stent placement 1 year ago, he currently has no chest pain at rest or shortness of breath, however he is high risk candidate for adverse cardio pulmonary outcome. Continue beta blocker as tolerated. Keep hemoglobin above 8, cardiology on board.  3.  Hypertension. Continue home medications along with as needed IV labetalol.  4. Leukocytosis. Likely reactionary from #1 above. UA and chest x-ray unremarkable. Afebrile. Leukocytosis has resolved.  5. Mild AKI. Resolved with hydration.   Code Status : Full  Family Communication  : None present  Disposition Plan  : Likely SNF in am  Barriers For Discharge : Left hip fracture  Consults  :  Cards, ortho  Procedures  :  TTE -  Compared to 2015, there is little progression of aortic stenosis  (severity was previously overestimated due to underestimation of  LVOT diameter) and overall LV systolic function remains normal.  However, new regional wall motion abnormalities are seen. EF 60%.   DVT Prophylaxis  :    Heparin    Lab Results  Component Value Date   PLT 132* 12/24/2015    Antibiotics  :    Anti-infectives    None        Objective:   Filed Vitals:   12/23/15 0214 12/23/15 1443 12/23/15 2041 12/24/15 0617  BP: 163/84 154/76 136/61 149/72  Pulse: 101 106  78  Temp: 98.8 F (37.1 C) 98.5 F (36.9 C) 98.8 F (37.1 C) 98.8 F (37.1 C)  TempSrc: Oral Oral Oral   Resp: Height:  (1.727 m)     Weight: 68 kg (149 lb 14.6 oz)     SpO2: 94% 94% 95% 94%    Wt Readings from Last 3 Encounters:  12/23/15 68 kg (149 lb 14.6 oz)  11/03/15 66.452 kg (146 lb 8 oz)  09/26/15 67.042 kg (147 lb 12.8 oz)     Intake/Output Summary (Last 24 hours) at 12/24/15 1024 Last data filed at 12/24/15 0626  Gross per 24 hour  Intake      0 ml  Output    950 ml  Net   -950 ml     Physical Exam  Awake Alert, Oriented X 3, No new F.N deficits, Normal affect Rogers.AT,PERRAL Supple Neck,No JVD, No cervical lymphadenopathy appriciated.  Symmetrical Chest wall movement, Good air movement bilaterally, CTAB RRR,No Gallops,Rubs, +ve aortic systolic Murmur, No Parasternal Heave +ve B.Sounds, Abd Soft, No tenderness, No organomegaly appriciated, No rebound - guarding or  rigidity. No Cyanosis, Clubbing or edema, No new Rash or bruise , l hip tender     Data Review:    CBC  Recent Labs Lab 12/22/15 2123 12/23/15 0259 12/24/15 0426  WBC 7.1 15.0* 8.7  HGB 11.7* 11.8* 11.8*  HCT 35.4* 35.4* 35.9*  PLT 167 138* 132*  MCV 91.0 90.3 91.8  MCH 30.1 30.1 30.2  MCHC 33.1 33.3 32.9  RDW 13.2 13.2 13.4  LYMPHSABS 1.7  --   --   MONOABS 0.5  --   --   EOSABS 0.7  --   --   BASOSABS 0.0  --   --     Chemistries   Recent Labs Lab 12/22/15 2123 12/23/15 0259 12/24/15 0426  NA 136 141 139  K 3.8 3.9 4.0  CL 102 107 105  CO2 GLUCOSE 105* 124* 99  BUN CREATININE 1.31* 1.10 1.29*  CALCIUM 9.2 9.2 9.1  AST 30  --   --   ALT 16*  --   --   ALKPHOS 91  --   --   BILITOT 0.7  --   --    ------------------------------------------------------------------------------------------------------------------ No results for input(s): CHOL, HDL, LDLCALC, TRIG, CHOLHDL, LDLDIRECT in the last 72 hours.  No results found for: HGBA1C ------------------------------------------------------------------------------------------------------------------ No results for input(s): TSH, T4TOTAL, T3FREE, THYROIDAB in the last 72 hours.  Invalid input(s): FREET3 ------------------------------------------------------------------------------------------------------------------ No results for input(s): VITAMINB12, FOLATE, FERRITIN, TIBC, IRON, RETICCTPCT in the last 72 hours.  Coagulation profile  Recent Labs Lab 12/23/15 1030  INR 1.34    No results for input(s): DDIMER in the last 72 hours.  Cardiac Enzymes No results for  input(s): CKMB, TROPONINI, MYOGLOBIN in the last 168 hours.  Invalid input(s): CK ------------------------------------------------------------------------------------------------------------------ No results found for: BNP  Inpatient Medications  Scheduled Meds: . amLODipine  10 mg Oral q morning - 10a  . aspirin  81  mg Oral Daily  . atorvastatin  80 mg Oral q morning - 10a  . docusate sodium  100 mg Oral BID  . famotidine  20 mg Oral BID  . heparin subcutaneous  5,000 Units Subcutaneous 3 times per day  . isosorbide mononitrate  30 mg Oral q morning - 10a  . levothyroxine  112 mcg Oral QAC breakfast  . metoprolol  25 mg Oral BID  . niacin  500 mg Oral q morning - 10a   Continuous Infusions:  PRN Meds:.acetaminophen, baclofen, fentaNYL (SUBLIMAZE) injection, HYDROmorphone (DILAUDID) injection, ipratropium-albuterol, labetalol, nitroGLYCERIN, oxyCODONE  Micro Results No results found for this or any previous visit (from the past 240 hour(s)).  Radiology Reports Dg Chest Port 1 View  12/23/2015  CLINICAL DATA:  Pre op for right hip Fx surgery Hx of HTN, CVA, coronary artery disease, emphysema of lung, AAA EXAM: PORTABLE CHEST 1 VIEW COMPARISON:  09/13/2015 FINDINGS: Patient rotated to the left. Midline trachea. Cardiomegaly accentuated by AP portable technique. Atherosclerosis in the transverse aorta. No pleural effusion or pneumothorax. Left infrahilar airspace disease. Clear right lung. IMPRESSION: Oblique portable radiograph demonstrating left infrahilar airspace disease. This could represent mild scarring, accentuated by low lung volumes and obliquity. If there are cardiopulmonary symptoms, recommend preop PA and lateral radiographs. If not, this could be re-evaluated with nonemergent outpatient PA and lateral films. Cardiomegaly without congestive failure. These results will be called to the ordering clinician or representative by the Radiologist Assistant, and communication documented in the PACS or zVision Dashboard. Electronically Signed   By: Jeronimo Greaves M.D.   On: 12/23/2015 10:18   Dg Hip Unilat With Pelvis 2-3 Views Left  12/22/2015  CLINICAL DATA:  Fall with left hip pain EXAM: DG HIP (WITH OR WITHOUT PELVIS) 2-3V LEFT COMPARISON:  None. FINDINGS: Comminuted nondisplaced intertrochanteric left  proximal femur fracture. No additional fracture. No dislocation or appreciable arthropathy in the left hip. Diffuse osteopenia. Degenerative changes in the visualized lower lumbar spine. Vascular calcifications throughout the soft tissues. Prominent stool throughout the visualized colon. IMPRESSION: Comminuted nondisplaced intertrochanteric left proximal femur fracture. Diffuse osteopenia. Electronically Signed   By: Delbert Phenix M.D.   On: 12/22/2015 22:00    Time Spent in minutes  25   Charnelle Bergeman K M.D on 12/24/2015 at 10:24 AM  Between 7am to 7pm - Pager - [phone]  After 7pm go to [email or web address] - password [facility]  [facility] -  Office  [phone]

## 2015-12-25 DIAGNOSIS — M25519 Pain in unspecified shoulder: Secondary | ICD-10-CM | POA: Diagnosis not present

## 2015-12-25 DIAGNOSIS — S72009S Fracture of unspecified part of neck of unspecified femur, sequela: Secondary | ICD-10-CM | POA: Diagnosis not present

## 2015-12-25 DIAGNOSIS — N390 Urinary tract infection, site not specified: Secondary | ICD-10-CM | POA: Diagnosis not present

## 2015-12-25 DIAGNOSIS — I714 Abdominal aortic aneurysm, without rupture: Secondary | ICD-10-CM | POA: Diagnosis not present

## 2015-12-25 DIAGNOSIS — R0602 Shortness of breath: Secondary | ICD-10-CM | POA: Diagnosis not present

## 2015-12-25 DIAGNOSIS — G9340 Encephalopathy, unspecified: Secondary | ICD-10-CM | POA: Diagnosis not present

## 2015-12-25 DIAGNOSIS — R0902 Hypoxemia: Secondary | ICD-10-CM | POA: Diagnosis present

## 2015-12-25 DIAGNOSIS — I2699 Other pulmonary embolism without acute cor pulmonale: Secondary | ICD-10-CM | POA: Diagnosis not present

## 2015-12-25 DIAGNOSIS — I350 Nonrheumatic aortic (valve) stenosis: Secondary | ICD-10-CM | POA: Diagnosis not present

## 2015-12-25 DIAGNOSIS — R402421 Glasgow coma scale score 9-12, in the field [EMT or ambulance]: Secondary | ICD-10-CM | POA: Diagnosis not present

## 2015-12-25 DIAGNOSIS — J9601 Acute respiratory failure with hypoxia: Secondary | ICD-10-CM | POA: Diagnosis not present

## 2015-12-25 DIAGNOSIS — R296 Repeated falls: Secondary | ICD-10-CM | POA: Diagnosis not present

## 2015-12-25 DIAGNOSIS — I2510 Atherosclerotic heart disease of native coronary artery without angina pectoris: Secondary | ICD-10-CM | POA: Diagnosis not present

## 2015-12-25 DIAGNOSIS — R0600 Dyspnea, unspecified: Secondary | ICD-10-CM | POA: Diagnosis not present

## 2015-12-25 DIAGNOSIS — R109 Unspecified abdominal pain: Secondary | ICD-10-CM | POA: Diagnosis not present

## 2015-12-25 DIAGNOSIS — S72002A Fracture of unspecified part of neck of left femur, initial encounter for closed fracture: Secondary | ICD-10-CM | POA: Diagnosis not present

## 2015-12-25 DIAGNOSIS — S72002D Fracture of unspecified part of neck of left femur, subsequent encounter for closed fracture with routine healing: Secondary | ICD-10-CM | POA: Diagnosis not present

## 2015-12-25 DIAGNOSIS — Z79899 Other long term (current) drug therapy: Secondary | ICD-10-CM | POA: Diagnosis not present

## 2015-12-25 DIAGNOSIS — D649 Anemia, unspecified: Secondary | ICD-10-CM | POA: Diagnosis present

## 2015-12-25 DIAGNOSIS — S7292XD Unspecified fracture of left femur, subsequent encounter for closed fracture with routine healing: Secondary | ICD-10-CM | POA: Diagnosis not present

## 2015-12-25 DIAGNOSIS — M25529 Pain in unspecified elbow: Secondary | ICD-10-CM | POA: Diagnosis not present

## 2015-12-25 DIAGNOSIS — I10 Essential (primary) hypertension: Secondary | ICD-10-CM | POA: Diagnosis not present

## 2015-12-25 DIAGNOSIS — W19XXXA Unspecified fall, initial encounter: Secondary | ICD-10-CM | POA: Diagnosis present

## 2015-12-25 DIAGNOSIS — N189 Chronic kidney disease, unspecified: Secondary | ICD-10-CM | POA: Diagnosis present

## 2015-12-25 DIAGNOSIS — I82412 Acute embolism and thrombosis of left femoral vein: Secondary | ICD-10-CM | POA: Diagnosis not present

## 2015-12-25 DIAGNOSIS — E039 Hypothyroidism, unspecified: Secondary | ICD-10-CM | POA: Diagnosis not present

## 2015-12-25 DIAGNOSIS — M25539 Pain in unspecified wrist: Secondary | ICD-10-CM | POA: Diagnosis not present

## 2015-12-25 DIAGNOSIS — Z7902 Long term (current) use of antithrombotics/antiplatelets: Secondary | ICD-10-CM | POA: Diagnosis not present

## 2015-12-25 DIAGNOSIS — R262 Difficulty in walking, not elsewhere classified: Secondary | ICD-10-CM | POA: Diagnosis not present

## 2015-12-25 DIAGNOSIS — Z8673 Personal history of transient ischemic attack (TIA), and cerebral infarction without residual deficits: Secondary | ICD-10-CM | POA: Diagnosis not present

## 2015-12-25 DIAGNOSIS — M8580 Other specified disorders of bone density and structure, unspecified site: Secondary | ICD-10-CM | POA: Diagnosis not present

## 2015-12-25 DIAGNOSIS — I2690 Septic pulmonary embolism without acute cor pulmonale: Secondary | ICD-10-CM | POA: Diagnosis not present

## 2015-12-25 DIAGNOSIS — I2119 ST elevation (STEMI) myocardial infarction involving other coronary artery of inferior wall: Secondary | ICD-10-CM | POA: Diagnosis not present

## 2015-12-25 DIAGNOSIS — J449 Chronic obstructive pulmonary disease, unspecified: Secondary | ICD-10-CM | POA: Diagnosis present

## 2015-12-25 DIAGNOSIS — F329 Major depressive disorder, single episode, unspecified: Secondary | ICD-10-CM | POA: Diagnosis present

## 2015-12-25 DIAGNOSIS — K219 Gastro-esophageal reflux disease without esophagitis: Secondary | ICD-10-CM | POA: Diagnosis present

## 2015-12-25 DIAGNOSIS — Z515 Encounter for palliative care: Secondary | ICD-10-CM | POA: Diagnosis present

## 2015-12-25 DIAGNOSIS — J439 Emphysema, unspecified: Secondary | ICD-10-CM | POA: Diagnosis not present

## 2015-12-25 DIAGNOSIS — G9341 Metabolic encephalopathy: Secondary | ICD-10-CM | POA: Diagnosis not present

## 2015-12-25 DIAGNOSIS — R1010 Upper abdominal pain, unspecified: Secondary | ICD-10-CM | POA: Diagnosis not present

## 2015-12-25 DIAGNOSIS — N179 Acute kidney failure, unspecified: Secondary | ICD-10-CM | POA: Diagnosis not present

## 2015-12-25 DIAGNOSIS — E0781 Sick-euthyroid syndrome: Secondary | ICD-10-CM | POA: Diagnosis present

## 2015-12-25 DIAGNOSIS — I214 Non-ST elevation (NSTEMI) myocardial infarction: Secondary | ICD-10-CM | POA: Diagnosis not present

## 2015-12-25 DIAGNOSIS — I6350 Cerebral infarction due to unspecified occlusion or stenosis of unspecified cerebral artery: Secondary | ICD-10-CM | POA: Diagnosis not present

## 2015-12-25 DIAGNOSIS — R748 Abnormal levels of other serum enzymes: Secondary | ICD-10-CM | POA: Diagnosis not present

## 2015-12-25 DIAGNOSIS — Z7982 Long term (current) use of aspirin: Secondary | ICD-10-CM | POA: Diagnosis not present

## 2015-12-25 DIAGNOSIS — Z955 Presence of coronary angioplasty implant and graft: Secondary | ICD-10-CM | POA: Diagnosis not present

## 2015-12-25 DIAGNOSIS — Z720 Tobacco use: Secondary | ICD-10-CM | POA: Diagnosis not present

## 2015-12-25 DIAGNOSIS — M25559 Pain in unspecified hip: Secondary | ICD-10-CM | POA: Diagnosis not present

## 2015-12-25 DIAGNOSIS — M79603 Pain in arm, unspecified: Secondary | ICD-10-CM | POA: Diagnosis not present

## 2015-12-25 DIAGNOSIS — E876 Hypokalemia: Secondary | ICD-10-CM | POA: Diagnosis not present

## 2015-12-25 DIAGNOSIS — I129 Hypertensive chronic kidney disease with stage 1 through stage 4 chronic kidney disease, or unspecified chronic kidney disease: Secondary | ICD-10-CM | POA: Diagnosis present

## 2015-12-25 DIAGNOSIS — J9811 Atelectasis: Secondary | ICD-10-CM | POA: Diagnosis not present

## 2015-12-25 DIAGNOSIS — Z66 Do not resuscitate: Secondary | ICD-10-CM | POA: Diagnosis present

## 2015-12-25 DIAGNOSIS — S72142A Displaced intertrochanteric fracture of left femur, initial encounter for closed fracture: Secondary | ICD-10-CM | POA: Diagnosis not present

## 2015-12-25 DIAGNOSIS — E7800 Pure hypercholesterolemia, unspecified: Secondary | ICD-10-CM | POA: Diagnosis present

## 2015-12-25 DIAGNOSIS — I739 Peripheral vascular disease, unspecified: Secondary | ICD-10-CM | POA: Diagnosis present

## 2015-12-25 DIAGNOSIS — R042 Hemoptysis: Secondary | ICD-10-CM | POA: Diagnosis not present

## 2015-12-25 DIAGNOSIS — S72009A Fracture of unspecified part of neck of unspecified femur, initial encounter for closed fracture: Secondary | ICD-10-CM | POA: Diagnosis not present

## 2015-12-25 DIAGNOSIS — M160 Bilateral primary osteoarthritis of hip: Secondary | ICD-10-CM | POA: Diagnosis not present

## 2015-12-25 MED ORDER — CLOPIDOGREL BISULFATE 75 MG PO TABS
75.0000 mg | ORAL_TABLET | Freq: Every day | ORAL | Status: DC
  Administered 2015-12-25: 75 mg via ORAL
  Filled 2015-12-25: qty 1

## 2015-12-25 MED ORDER — ASPIRIN EC 325 MG PO TBEC
325.0000 mg | DELAYED_RELEASE_TABLET | Freq: Every day | ORAL | Status: DC
  Administered 2015-12-25: 325 mg via ORAL
  Filled 2015-12-25: qty 1

## 2015-12-25 MED ORDER — DOCUSATE SODIUM 100 MG PO CAPS: 100.0000 mg | ORAL_CAPSULE | Freq: Two times a day (BID) | ORAL | Status: AC

## 2015-12-25 MED ORDER — OXYCODONE HCL 5 MG PO TABS: 5.0000 mg | ORAL_TABLET | ORAL | Status: DC | PRN

## 2015-12-25 MED ORDER — MORPHINE SULFATE ER 30 MG PO TBCR
30.0000 mg | EXTENDED_RELEASE_TABLET | Freq: Two times a day (BID) | ORAL | Status: DC
  Administered 2015-12-25: 30 mg via ORAL
  Filled 2015-12-25: qty 1

## 2015-12-25 MED ORDER — PANTOPRAZOLE SODIUM 40 MG PO TBEC
40.0000 mg | DELAYED_RELEASE_TABLET | Freq: Every day | ORAL | Status: DC
  Administered 2015-12-25: 40 mg via ORAL
  Filled 2015-12-25: qty 1

## 2015-12-25 MED ORDER — BACLOFEN 10 MG PO TABS: 10.0000 mg | ORAL_TABLET | Freq: Three times a day (TID) | ORAL | Status: AC | PRN

## 2015-12-25 MED ORDER — PANTOPRAZOLE SODIUM 40 MG PO TBEC: 40.0000 mg | DELAYED_RELEASE_TABLET | Freq: Every day | ORAL | Status: AC

## 2015-12-25 MED ORDER — ONDANSETRON HCL 4 MG/2ML IJ SOLN
4.0000 mg | Freq: Four times a day (QID) | INTRAMUSCULAR | Status: DC | PRN
  Administered 2015-12-25: 4 mg via INTRAVENOUS

## 2015-12-25 MED ORDER — MORPHINE SULFATE ER 30 MG PO TBCR: 30.0000 mg | EXTENDED_RELEASE_TABLET | Freq: Two times a day (BID) | ORAL | Status: DC

## 2015-12-25 NOTE — Clinical Social Work Note (Signed)
Clinical Social Work Assessment  Patient Details  Name: Maxwell Copeland MRN: [id number] Date of Birth: 03/03/1932  Date of referral:  12/25/15               Reason for consult:  Facility Placement                Permission sought to share information with:  Family Supports, Oceanographeracility Contact Representative Permission granted to share information::  Yes, Verbal Permission Granted  Name::      (daughters and son)  Agency::   [facility]([place] SN[facility])  Relationship::     Contact Information:     Housing/Transportation Living arrangements for the past 2 months:  Skilled Building surveyorursing Facility Source of Information:  Patient, Adult Children (daughter Maxwell Copeland) Patient Interpreter Needed:  None Criminal Activity/Legal Involvement Pertinent to Current Situation/Hospitalization:  No - Comment as needed Significant Relationships:  Adult Children Lives with:  Self Do you feel safe going back to the place where you live?  No Need for family participation in patient care:     Care giving concerns:  Daughter is concerned that family cannot care for patient at time of discharge.  Daughter, Maxwell Copeland states patient was completely independent prior to admission.   Social Worker assessment / plan:  CSW received consult for possible SNF placement.  Patient's orientation fluctuates and is only oriented to self at this time.  CSW spoke with patient's daughter, Maxwell Copeland, who is main caregiver.  Maxwell Copeland states that her brother (patient's son) dropped by to check on the patient often prior to admission.  Maxwell Copeland states she lives next door to patient.  Reportedly, patient was from home alone completely independent with his ADLs and continuing to drive prior to admission.  Family and patient are unsure if patient will receive surgery due to his complex medical issues.  Family is requesting orthopedic MD and cardiologist to come together to a family meeting prior to a decision being made.  This request was made through the RN director of  [facility] per daughter, Maxwell Copeland.  At this time, PT is recommending SNF.  Family is agreeable to SNF at time of discharge and requests CSW to [place] [facility] [place] [place]. The family does not have preference at this point, but does wish for patient to [place] [address] [place].  Daughter expressed guilt in regards to the complicated decision that needs to be made.  CSW offered support.    Employment status:  Retired Database administratornsurance information:  Managed Medicare PT Recommendations:  Skilled Nursing Facility Information / Referral to community resources:  Skilled Nursing Facility  Patient/Family's Response to care:  Family is agreeable to SNF.  Patient/Family's Understanding of and Emotional Response to Diagnosis, Current Treatment, and Prognosis:  Unable to access patient's response.  Family is having a difficult time dealing with the patient's new prognosis.  Patient is unsure if he will/will not have surgery due to his complex medical issues, which adds more stress on the children who will ultimately make the decision for surgery. CSW offered support.  Emotional Assessment Appearance:  Appears stated age Attitude/Demeanor/Rapport:    Affect (typically observed):  Accepting Orientation:  Oriented to Self, Fluctuating Orientation (Suspected and/or reported Sundowners) Alcohol / Substance use:  Not Applicable Psych involvement (Current and /or in the community):  No (Comment)  Discharge Needs  Concerns to be addressed:  Adjustment to Illness, Care Coordination Readmission within the last 30 days:  No Current discharge risk:  Cognitively Impaired Barriers  to Discharge:  Continued Medical Work up, [facility]   Melrose Nakayama 12/25/2015, 11:36 AM

## 2015-12-25 NOTE — Clinical Social Work Placement (Signed)
   CLINICAL SOCIAL WORK PLACEMENT  NOTE  Date:  12/25/2015  Patient Details  Name: Maxwell Copeland MRN: [id number] Date of Birth: 12/10/1931  Clinical Social Work is seeking post-discharge placement for this patient at the Skilled  Nursing Facility level of care (*CSW will initial, date and re-position this form in  chart as items are completed):  Yes   Patient/family provided with [facility] Clinical Social Work Department's list of facilities offering this level of care within the geographic area requested by the patient (or if unable, by the patient's family).  Yes   Patient/family informed of their freedom to choose among providers that offer the needed level of care, that participate in Medicare, Medicaid or managed care program needed by the patient, have an available bed and are willing to accept the patient.  Yes   Patient/family informed of [facility]'s ownership interest in [facility] and [facility], as well as of the fact that they are under no obligation to receive care at these facilities.  PASRR submitted to EDS on 12/25/15     PASRR number received on 12/25/15     Existing PASRR number confirmed on       FL2 transmitted to all facilities in geographic area requested by pt/family on 12/25/15     FL2 transmitted to all facilities within larger geographic area on       Patient informed that his/her managed care company has contracts with or will negotiate with certain facilities, including the following:        Yes   Patient/family informed of bed offers received.  [facility]     Physician recommends and patient chooses bed at      [name] 12/25[facility]15.  Patient to be transferred to facility by [facility]     Patient family notified on 12/25/15 of transfer.  Name of family member notified:  Maxwell Copeland daughter     PHYSICIAN Please sign FL2     Additional Comment:     _______________________________________________ Rondel BatonIngle, Temima Kutsch C, LCSW 12/25/2015, 3:01 PM

## 2015-12-25 NOTE — Clinical Social Work Note (Addendum)
Patient will discharge today per MD order. [facility] RN to call report prior to transportation to: [phone] Transportation: [facility]- to be scheduled after insurance approval  CSW sent discharge summary to SNF for review.  Packet is complete.  RN, patient and family aware of discharge plans.  Vickii PennaGina Ranya Fiddler, LCSW [phone]) [phone]  5N1-9, 2S 15-16 and Psychiatric Service Line  Licensed Clinical Social Worker     3:01pm- [facility] had no bed today.  [name] is now family's choice. Daughter June updated and agreeable to complete admission's paperwork with SNF today at 3:45pm.  CSW continues to await insurance authorization.  CSW spoke with daughter June to relay bed offers. D[facility] June [facility].  Insurance authorization is currently pending.  Daughter aware and hopeful of approval.    Vickii PennaGina Brayden Brodhead, LCSW [phone]  5N1-9; 2S 15-16 and Hospital Psychiatric Service Line Licensed Clinical Social Worker

## 2015-12-25 NOTE — Progress Notes (Signed)
[facility]. Discharge packet faxed to SNF by social work. Patient transported to SNF by [facility]. Family aware of transfer no c/o pain at d/c.   Bethzaida Boord, Kae HellerMiranda Lynn, RN

## 2015-12-25 NOTE — Discharge Summary (Addendum)
Maxwell Copeland, is a 82 y.o. male  DOB 1932-02-17  MRN [id number].  Admission date:  12/22/2015  Admitting Physician  [name], MD  Discharge Date:  12/25/2015   Primary MD  [name], [name]  Recommendations for primary care physician for things to follow:   Check CBC, BMP in a week. Kindly see activity instructions below carefully.  Patient follow-up with orthopedics in 3-[redacted] weeks along with his primary cardiologist in 1-2 weeks.   Admission Diagnosis  Closed left hip fracture, initial encounter [facility]) [S72.002A]   Discharge Diagnosis  Closed left hip fracture, initial encounter (HCC) [S72.002A]    Active Problems:   Closed left hip fracture (HCC)   Hip fracture [facility])      Past Medical History  Diagnosis Date  . Coronary artery disease     LAD w/ Ca++, 100% RCA, in-astent stenosis Cx, high grade lesion Ramus  . Hypercholesteremia   . Dyspnea   . Hypothyroid   . CVA (cerebral infarction)   . Arthritis     hips, will give out  . Varicella   . Depression     never took any medication.  . Emphysema of lung (HCC)     smoking related.  . Hypertension   . Stroke [facility])     left hemiparesis-100% recovery  . AAA (abdominal aortic aneurysm) (HCC)     small  . Severe aortic stenosis 2012    Past Surgical History  Procedure Laterality Date  . Coronary angioplasty with stent placement      Several stents, last in 2004       HPI  from the history and physical done on the day of admission:    Maxwell Copeland is a 82 y.o. gentleman with a history of severe CAD and aortic stenosis (he declined CABG/AVR in the past, diastolic dysfunction on last echo in 2015, and prior CVA who presents for evaluation after having a mechanical fall at home. He denies preceding chest pain, shortness of breath,  or syncope. He thinks that he may have been in a seated position too long, and his legs "gave out" when he got up from his seated position. He has his cell phone on his hip, and he immediately called his daughter, who lives nearby. She came over and called 911 for transport to the ED. Unfortunately, he has sustained a nondisplaced comminuted left proximal femur fracture. Ortho consult called to Dr. Charlann Boxer from the ED. Hospitalist asked to admit.  The patient is having active left hip pain and muscle spasm. He cannot give a ROS at this time. However, review of his medical record suggests that he has chronic angina (uses SL nitroglycerin regularly). No obvious leg swelling to suggest decompensated heart failure. Last echo in this system in 2015. Last cardiology note in this system was also 2015. Last two outpatient visits in this system were with his PCP.     Hospital Course:     1. Mechanical fall with left femoral fracture. He was seen by  Orthopedics Dr. [name] along with cardiology, due to patient's history of underlying CAD, wall motion abnormality on echo, moderate to severe aortic stenosis it was decided that patient will be a high-risk candidate for adverse cardio pulmonary outcome from the operative standpoint.   Patient and family have decided nonoperative management. I discussed his case with Dr. [name] who had extensive discussions with patient's family on 12/24/2015. Discussed his case with orthopedics Dr. [name] on 12/24/2015, he advised to continue aspirin and Plavix for DVT prophylaxis along with no weightbearing in the left leg and outpatient follow-up with him.  Orthopedic recommendations are pain control, PT evaluation with no weightbearing on the left leg for at least 8 weeks, SNF placement and outpatient orthopedics follow-up. Patient's family with agreeable with this plan.  2. CAD, moderate to severe as. EF 60%. Patient had declined left heart catheterization and stent  placement 1 year ago, he currently has no chest pain at rest or shortness of breath, Continue beta blocker, aspirin, Plavix, statin and indoor for secondary prevention. Keep hemoglobin above 8, cardiology saw the patient here and patient must follow with his primary cardiologist within 1-2 weeks.  3. Hypertension. Continue home medications along with pain control.  4. Leukocytosis. Likely reactionary from #1 above. UA and chest x-ray unremarkable. Afebrile. Leukocytosis has resolved.  5. Mild AKI. Resolved with hydration.       Follow UP  Follow-up Information    Follow up with Shelda PalLIN,MATTHEW D, MD. Schedule an appointment as soon as possible for a visit in 4 weeks.   Specialty:  Orthopedic Surgery   Why:  For X-ray follow up of left hip   Contact information:   [address] [place] [address] [phone]       Follow up with Jeanine Luzalone, [name], [name]. Schedule an appointment as soon as possible for a visit in 3 days.   Specialty:  Family Medicine   Contact information:   [address] [place] [address] [phone]       [facility] .   Specialty:  Skilled Nursing Facility   Contact information:   [address] [place] [id number] [phone]       Consults obtained - Ortho, cards  Discharge Condition: Fair  Diet and Activity recommendation: See Discharge Instructions below  Discharge Instructions           Discharge Instructions    Diet - low sodium heart healthy    Complete by:  As directed      Discharge instructions    Complete by:  As directed   Activity  - NON weight bearing LEFT lower extremity Pivot transfers on right leg, bed to chair, bed or chair to wheel chair Use walker or cane if needed with full assistance and fall precautions  Follow with Primary MD Jeanine Luzalone, [name], [name] in 7 days   Get CBC, CMP, 2 view Chest X ray checked  by Primary MD next visit.   Disposition SNF  Diet:    Heart Healthy with feeding assistance and aspiration precautions.  For Heart failure patients - Check your Weight same time everyday, if you gain over 2 pounds, or you develop in leg swelling, experience more shortness of breath or chest pain, call your Primary MD immediately. Follow Cardiac Low Salt Diet and 1.5 lit/day fluid restriction.   On your next visit with your primary care physician please Get Medicines reviewed and adjusted.   Please request your Prim.MD to  go over all Hospital Tests and Procedure/Radiological results at the follow up, please get all Hospital records sent to your Prim MD by signing hospital release before you go home.   If you experience worsening of your admission symptoms, develop shortness of breath, life threatening emergency, suicidal or homicidal thoughts you must seek medical attention immediately by calling 911 or calling your MD immediately  if symptoms less severe.  You Must read complete instructions/literature along with all the possible adverse reactions/side effects for all the Medicines you take and that have been prescribed to you. Take any new Medicines after you have completely understood and accpet all the possible adverse reactions/side effects.   Do not drive, operating heavy machinery, perform activities at heights, swimming or participation in water activities or provide baby sitting services if your were admitted for syncope or siezures until you have seen by Primary MD or a Neurologist and advised to do so again.  Do not drive when taking Pain medications.    Do not take more than prescribed Pain, Sleep and Anxiety Medications  Special Instructions: If you have smoked or chewed Tobacco  in the last 2 yrs please stop smoking, stop any regular Alcohol  and or any Recreational drug use.  Wear Seat belts while driving.   Please note  You were cared for by a hospitalist during your hospital stay. If you have any questions about your  discharge medications or the care you received while you were in the hospital after you are discharged, you can call the unit and asked to speak with the hospitalist on call if the hospitalist that took care of you is not available. Once you are discharged, your primary care physician will handle any further medical issues. Please note that NO REFILLS for any discharge medications will be authorized once you are discharged, as it is imperative that you return to your primary care physician (or establish a relationship with a primary care physician if you do not have one) for your aftercare needs so that they can reassess your need for medications and monitor your lab values.             Discharge Medications       Medication List    STOP taking these medications        ranitidine 150 MG tablet  Commonly known as:  ZANTAC      TAKE these medications        acetaminophen 325 MG tablet  Commonly known as:  TYLENOL  Take 2 tablets (650 mg total) by mouth every 4 (four) hours as needed for mild pain, fever or headache.     amLODipine 10 MG tablet  Commonly known as:  NORVASC  Take 10 mg by mouth every morning.     aspirin 325 MG tablet  Take 325 mg by mouth daily.     atorvastatin 80 MG tablet  Commonly known as:  LIPITOR  Take 80 mg by mouth every morning.     baclofen 10 MG tablet  Commonly known as:  LIORESAL  Take 1 tablet (10 mg total) by mouth 3 (three) times daily as needed for muscle spasms.     budesonide-formoterol 160-4.5 MCG/ACT inhaler  Commonly known as:  SYMBICORT  Inhale 2 puffs into the lungs 2 (two) times daily as needed (for shortness of breath). Reported on 09/13/2015     clopidogrel 75 MG tablet  Commonly known as:  PLAVIX  Take 75 mg by mouth every morning.  docusate sodium 100 MG capsule  Commonly known as:  COLACE  Take 1 capsule (100 mg total) by mouth 2 (two) times daily.     isosorbide mononitrate 30 MG 24 hr tablet  Commonly known as:   IMDUR  Take 30 mg by mouth every morning.     levothyroxine 112 MCG tablet  Commonly known as:  SYNTHROID, LEVOTHROID  Take 112 mcg by mouth daily before breakfast.     metoprolol 50 MG tablet  Commonly known as:  LOPRESSOR  Take 0.5 tablets (25 mg total) by mouth 2 (two) times daily.     morphine 30 MG 12 hr tablet  Commonly known as:  MS CONTIN  Take 1 tablet (30 mg total) by mouth every 12 (twelve) hours.     niacin 500 MG tablet  Commonly known as:  SLO-NIACIN  Take 500 mg by mouth every morning.     nitroGLYCERIN 0.4 MG SL tablet  Commonly known as:  NITROSTAT  Place 1 tablet (0.4 mg total) under the tongue every 5 (five) minutes as needed for chest pain.     oxyCODONE 5 MG immediate release tablet  Commonly known as:  Oxy IR/ROXICODONE  Take 1-2 tablets (5-10 mg total) by mouth every 4 (four) hours as needed for breakthrough pain ((for MODERATE breakthrough pain)).     pantoprazole 40 MG tablet  Commonly known as:  PROTONIX  Take 1 tablet (40 mg total) by mouth daily.     vitamin E 400 UNIT capsule  Take 800 Units by mouth daily.        Major procedures and Radiology Reports - PLEASE review detailed and final reports for all details, in brief -   TTE - Compared to 2015, there is little progression of aortic stenosis (severity was previously overestimated due to underestimation of LVOT diameter) and overall LV systolic function remains normal. However, new regional wall motion abnormalities are seen. EF 60%.     Dg Chest Port 1 View  12/23/2015  CLINICAL DATA:  Pre op for right hip Fx surgery Hx of HTN, CVA, coronary artery disease, emphysema of lung, AAA EXAM: PORTABLE CHEST 1 VIEW COMPARISON:  09/13/2015 FINDINGS: Patient rotated to the left. Midline trachea. Cardiomegaly accentuated by AP portable technique. Atherosclerosis in the transverse aorta. No pleural effusion or pneumothorax. Left infrahilar airspace disease. Clear right lung. IMPRESSION: Oblique  portable radiograph demonstrating left infrahilar airspace disease. This could represent mild scarring, accentuated by low lung volumes and obliquity. If there are cardiopulmonary symptoms, recommend preop PA and lateral radiographs. If not, this could be re-evaluated with nonemergent outpatient PA and lateral films. Cardiomegaly without congestive failure. These results will be called to the ordering clinician or representative by the Radiologist Assistant, and communication documented in the PACS or zVision Dashboard. Electronically Signed   By: Jeronimo Greaves M.D.   On: 12/23/2015 10:18   Dg Hip Unilat With Pelvis 2-3 Views Left  12/22/2015  CLINICAL DATA:  Fall with left hip pain EXAM: DG HIP (WITH OR WITHOUT PELVIS) 2-3V LEFT COMPARISON:  None. FINDINGS: Comminuted nondisplaced intertrochanteric left proximal femur fracture. No additional fracture. No dislocation or appreciable arthropathy in the left hip. Diffuse osteopenia. Degenerative changes in the visualized lower lumbar spine. Vascular calcifications throughout the soft tissues. Prominent stool throughout the visualized colon. IMPRESSION: Comminuted nondisplaced intertrochanteric left proximal femur fracture. Diffuse osteopenia. Electronically Signed   By: Delbert Phenix M.D.   On: 12/22/2015 22:00    Micro Results  Recent Results (from the past 240 hour(s))  Urine culture     Status: Abnormal   Collection Time: 12/22/15 10:48 PM  Result Value Ref Range Status   Specimen Description URINE, RANDOM  Final   Special Requests NONE  Final   Culture 2,000 COLONIES/mL INSIGNIFICANT GROWTH (A)  Final   Report Status 12/24/2015 FINAL  Final    Today   Subjective    Maxwell Copeland today has no headache,no chest abdominal pain,no new weakness tingling or numbness, feels much better.   Objective   Blood pressure 132/70, pulse 84, temperature 98.8 F (37.1 C), temperature source Oral, resp. rate 16, height  (1.727 m), weight 68 kg  (149 lb 14.6 oz), SpO2 95 %.   Intake/Output Summary (Last 24 hours) at 12/25/15 1646 Last data filed at 12/25/15 1414  Gross per 24 hour  Intake    400 ml  Output    325 ml  Net     75 ml    Exam Awake Alert, Oriented x 3, No new F.N deficits, Normal affect [place].AT,PERRAL Supple Neck,No JVD, No cervical lymphadenopathy appriciated.  Symmetrical Chest wall movement, Good air movement bilaterally, CTAB RRR,No Gallops,Rubs, +ve Aortic systolic Murmur, No Parasternal Heave +ve B.Sounds, Abd Soft, Non tender, No organomegaly appriciated, No rebound -guarding or rigidity. No Cyanosis, Clubbing or edema, No new Rash or bruise, L hip tender   Data Review   CBC w Diff:  Lab Results  Component Value Date   WBC 8.7 12/24/2015   HGB 11.8* 12/24/2015   HCT 35.9* 12/24/2015   PLT 132* 12/24/2015   LYMPHOPCT 23 12/22/2015   MONOPCT 7 12/22/2015   EOSPCT 11 12/22/2015   BASOPCT 0 12/22/2015    CMP:  Lab Results  Component Value Date   NA 139 12/24/2015   K 4.0 12/24/2015   CL 105 12/24/2015   CO2 24 12/24/2015   BUN 16 12/24/2015   CREATININE 1.29* 12/24/2015   PROT 7.5 12/22/2015   ALBUMIN 3.9 12/22/2015   BILITOT 0.7 12/22/2015   ALKPHOS 91 12/22/2015   AST 30 12/22/2015   ALT 16* 12/22/2015  .   Total Time in preparing paper work, data evaluation and todays exam - 35 minutes  Leroy Sea M.D on 12/25/2015 at 4:46 PM  [facility]   Office  [phone]

## 2015-12-25 NOTE — Discharge Instructions (Signed)
Activity  - NON weight bearing LEFT lower extremity Pivot transfers on right leg, bed to chair, bed or chair to wheel chair Use walker or cane if needed with full assistance and fall precautions  Follow with Primary MD Jeanine Luzalone, [name], [name] in 7 days   Get CBC, CMP, 2 view Chest X ray checked  by Primary MD next visit.   Disposition SNF  Diet:   Heart Healthy with feeding assistance and aspiration precautions.  For Heart failure patients - Check your Weight same time everyday, if you gain over 2 pounds, or you develop in leg swelling, experience more shortness of breath or chest pain, call your Primary MD immediately. Follow Cardiac Low Salt Diet and 1.5 lit/day fluid restriction.   On your next visit with your primary care physician please Get Medicines reviewed and adjusted.   Please request your Prim.MD to go over all Hospital Tests and Procedure/Radiological results at the follow up, please get all Hospital records sent to your Prim MD by signing hospital release before you go home.   If you experience worsening of your admission symptoms, develop shortness of breath, life threatening emergency, suicidal or homicidal thoughts you must seek medical attention immediately by calling 911 or calling your MD immediately  if symptoms less severe.  You Must read complete instructions/literature along with all the possible adverse reactions/side effects for all the Medicines you take and that have been prescribed to you. Take any new Medicines after you have completely understood and accpet all the possible adverse reactions/side effects.   Do not drive, operating heavy machinery, perform activities at heights, swimming or participation in water activities or provide baby sitting services if your were admitted for syncope or siezures until you have seen by Primary MD or a Neurologist and advised to do so again.  Do not drive when taking Pain medications.    Do not take more than prescribed  Pain, Sleep and Anxiety Medications  Special Instructions: If you have smoked or chewed Tobacco  in the last 2 yrs please stop smoking, stop any regular Alcohol  and or any Recreational drug use.  Wear Seat belts while driving.   Please note  You were cared for by a hospitalist during your hospital stay. If you have any questions about your discharge medications or the care you received while you were in the hospital after you are discharged, you can call the unit and asked to speak with the hospitalist on call if the hospitalist that took care of you is not available. Once you are discharged, your primary care physician will handle any further medical issues. Please note that NO REFILLS for any discharge medications will be authorized once you are discharged, as it is imperative that you return to your primary care physician (or establish a relationship with a primary care physician if you do not have one) for your aftercare needs so that they can reassess your need for medications and monitor your lab values.

## 2015-12-25 NOTE — Clinical Social Work Placement (Signed)
   CLINICAL SOCIAL WORK PLACEMENT  NOTE  Date:  12/25/2015  Patient Details  Name: Osvaldo ShipperRichard C Behunin MRN: [id number] Date of Birth: 07/23/1932  Clinical Social Work is seeking post-discharge placement for this patient at the Skilled  Nursing Facility level of care (*CSW will initial, date and re-position this form in  chart as items are completed):  Yes   Patient/family provided with [facility] Clinical Social Work Department's list of facilities offering this level of care within the geographic area requested by the patient (or if unable, by the patient's family).  Yes   Patient/family informed of their freedom to choose among providers that offer the needed level of care, that participate in Medicare, Medicaid or managed care program needed by the patient, have an available bed and are willing to accept the patient.  Yes   Patient/family informed of [facility]'s ownership interest in [facility] and [facility], as well as of the fact that they are under no obligation to receive care at these facilities.  PASRR submitted to EDS on 12/25/15     PASRR number received on 12/25/15     Existing PASRR number confirmed on       FL2 transmitted to all facilities in geographic area requested by pt/family on 12/25/15     FL2 transmitted to all facilities within larger geographic area on       Patient informed that his/her managed care company has contracts with or will negotiate with certain facilities, including the following:            Patient/family informed of bed offers received.  Patient chooses bed at       Physician recommends and patient chooses bed at      Patient to be transferred to   on  .  Patient to be transferred to facility by       Patient family notified on   of transfer.  Name of family member notified:        PHYSICIAN Please sign FL2     Additional Comment:    _______________________________________________ Rondel BatonIngle, Shamarcus Hoheisel C, LCSW 12/25/2015,  11:53 AM

## 2015-12-25 NOTE — [place] FL2 (Signed)
[place] MEDICAID FL2 LEVEL OF CARE SCREENING TOOL     IDENTIFICATION  Patient Name: Maxwell Copeland Birthdate: 08/25/1932 Sex: male Admission Date (Current Location): 12/22/2015  [facility] [place]   Facility and Address:  [facility], [address], [place], [place] [address]      Provider Number: [id number]  Attending Physician Name and Address:  Leroy SeaPrashant K Singh, MD  Relative Name and Phone Number:       Current Level of Care: Hospital Recommended Level of Care: Skilled Nursing Facility Prior Approval Number:    Date Approved/Denied:   PASRR Number: [id number]  Discharge Plan: SNF    Current Diagnoses: Patient Active Problem List   Diagnosis Date Noted  . Closed left hip fracture (HCC) 12/23/2015  . Hip fracture (HCC) 12/23/2015  . Acute bronchitis 09/13/2015  . Non-compliant behavior 03/27/2014  . Malnutrition of moderate degree (HCC) 03/20/2014  . NSTEMI (non-ST elevated myocardial infarction) (HCC) 03/18/2014  . PNA (pneumonia), secondary to aspiration 03/18/2014  . Severe aortic stenosis   . LBP radiating to right leg 01/14/2013  . AAA (abdominal aortic aneurysm) without rupture (HCC) 01/14/2013  . Elevated PSA 01/14/2013  . Dizzy spells 01/30/2011  . AORTIC VALVE DISORDERS 08/13/2009  . PVD 08/13/2009  . CAROTID BRUIT 08/13/2009  . HYPOTHYROIDISM 02/22/2009  . HYPERCHOLESTEROLEMIA  IIA 02/22/2009  . CAD, UNSPECIFIED SITE 02/22/2009  . CVA 02/22/2009  . DYSPNEA 02/22/2009    Orientation RESPIRATION BLADDER Height & Weight     Self  Normal Continent Weight: 149 lb 14.6 oz (68 kg) Height:  5\' 8"  (172.7 cm)  BEHAVIORAL SYMPTOMS/MOOD NEUROLOGICAL BOWEL NUTRITION STATUS      Continent    AMBULATORY STATUS COMMUNICATION OF NEEDS Skin   Extensive Assist Verbally Normal                       Personal Care Assistance Level of Assistance  Bathing, Dressing Bathing Assistance: Maximum assistance    Dressing Assistance: Maximum assistance     Functional Limitations Info             SPECIAL CARE FACTORS FREQUENCY  PT (By licensed PT), OT (By licensed OT)     PT Frequency: daily OT Frequency: daily            Contractures Contractures Info: Not present    Additional Factors Info  Allergies   Allergies Info: prednisone           Current Medications (12/25/2015):  This is the current hospital active medication list Current Facility-Administered Medications  Medication Dose Route Frequency Provider Last Rate Last Dose  . acetaminophen (TYLENOL) tablet 650 mg  650 mg Oral Q4H PRN Michael LitterNikki Carter, MD      . amLODipine (NORVASC) tablet 10 mg  10 mg Oral q morning - 10a Michael LitterNikki Carter, MD   10 mg at 12/25/15 0957  . aspirin EC tablet 325 mg  325 mg Oral Daily Leroy SeaPrashant K Singh, MD   325 mg at 12/25/15 0956  . atorvastatin (LIPITOR) tablet 80 mg  80 mg Oral q morning - 10a Michael LitterNikki Carter, MD   80 mg at 12/25/15 0957  . baclofen (LIORESAL) tablet 10 mg  10 mg Oral TID PRN Leroy SeaPrashant K Singh, MD   10 mg at 12/23/15 1728  . clopidogrel (PLAVIX) tablet 75 mg  75 mg Oral Daily Leroy SeaPrashant K Singh, MD   75 mg at 12/25/15 0956  . docusate  sodium (COLACE) capsule 100 mg  100 mg Oral BID [name], MD   100 mg at 12/25/15 0956  . famotidine (PEPCID) tablet 20 mg  20 mg Oral BID [name], MD   20 mg at 12/25/15 0956  . HYDROmorphone (DILAUDID) injection 0.5 mg  0.5 mg Intravenous Q2H PRN [name], MD   0.5 mg at 12/24/15 0847  . ipratropium-albuterol (DUONEB) 0.5-2.5 (3) MG/3ML nebulizer solution 3 mL  3 mL Nebulization Q4H PRN [name], MD      . isosorbide mononitrate (IMDUR) 24 hr tablet 30 mg  30 mg Oral q morning - 10a [name], MD   30 mg at 12/25/15 0956  . labetalol (NORMODYNE,TRANDATE) injection 10 mg  10 mg Intravenous Q4H PRN [name], MD      . levothyroxine (SYNTHROID, LEVOTHROID) tablet 112 mcg  112 mcg Oral QAC breakfast [name], MD   112 mcg at 12/25/15  0956  . metoprolol tartrate (LOPRESSOR) tablet 25 mg  25 mg Oral BID [name], MD   25 mg at 12/25/15 0956  . morphine (MS CONTIN) 12 hr tablet 30 mg  30 mg Oral Q12H Leroy Sea, MD   30 mg at 12/25/15 0956  . niacin (SLO-NIACIN) CR tablet 500 mg  500 mg Oral q morning - 10a [name], MD   500 mg at 12/25/15 0956  . nitroGLYCERIN (NITROSTAT) SL tablet 0.4 mg  0.4 mg Sublingual Q5 min PRN [name], MD      . ondansetron [facility]) injection 4 mg  4 mg Intravenous Q6H PRN Leroy Sea, MD      . oxyCODONE (Oxy IR/ROXICODONE) immediate release tablet 5-10 mg  5-10 mg Oral Q4H PRN [name], MD   10 mg at 12/24/15 1503  . pantoprazole (PROTONIX) EC tablet 40 mg  40 mg Oral Daily Leroy Sea, MD   40 mg at 12/25/15 1610     Discharge Medications: Please see discharge summary for a list of discharge medications.  Relevant Imaging Results:  Relevant Lab Results:   Additional Information SSN: [id number]  Rondel Baton, LCSW

## 2015-12-26 ENCOUNTER — Telehealth: Payer: Self-pay | Admitting: *Deleted

## 2015-12-26 DIAGNOSIS — R262 Difficulty in walking, not elsewhere classified: Secondary | ICD-10-CM | POA: Diagnosis not present

## 2015-12-26 DIAGNOSIS — M79603 Pain in arm, unspecified: Secondary | ICD-10-CM | POA: Diagnosis not present

## 2015-12-26 DIAGNOSIS — M25529 Pain in unspecified elbow: Secondary | ICD-10-CM | POA: Diagnosis not present

## 2015-12-26 DIAGNOSIS — I10 Essential (primary) hypertension: Secondary | ICD-10-CM | POA: Diagnosis not present

## 2015-12-26 DIAGNOSIS — S7292XD Unspecified fracture of left femur, subsequent encounter for closed fracture with routine healing: Secondary | ICD-10-CM | POA: Diagnosis not present

## 2015-12-26 DIAGNOSIS — M25539 Pain in unspecified wrist: Secondary | ICD-10-CM | POA: Diagnosis not present

## 2015-12-26 DIAGNOSIS — I2510 Atherosclerotic heart disease of native coronary artery without angina pectoris: Secondary | ICD-10-CM | POA: Diagnosis not present

## 2015-12-26 DIAGNOSIS — M25519 Pain in unspecified shoulder: Secondary | ICD-10-CM | POA: Diagnosis not present

## 2015-12-26 DIAGNOSIS — M25559 Pain in unspecified hip: Secondary | ICD-10-CM | POA: Diagnosis not present

## 2015-12-26 NOTE — Telephone Encounter (Signed)
Pt was on TCM list admitted for (L) hip fracture. Pt d/c 12/25/15 sent to SNF...Raechel Chute/lmb

## 2015-12-27 ENCOUNTER — Emergency Department (HOSPITAL_COMMUNITY): Payer: Medicare Other

## 2015-12-27 ENCOUNTER — Inpatient Hospital Stay (HOSPITAL_COMMUNITY): Payer: Medicare Other

## 2015-12-27 ENCOUNTER — Encounter (HOSPITAL_COMMUNITY): Payer: Self-pay

## 2015-12-27 ENCOUNTER — Inpatient Hospital Stay (HOSPITAL_COMMUNITY)
Admission: EM | Admit: 2015-12-27 | Discharge: 2016-01-02 | DRG: 166 | Disposition: A | Discharge status: Skilled Nursing Facility | Payer: Medicare Other | Attending: Internal Medicine | Admitting: Internal Medicine

## 2015-12-27 DIAGNOSIS — I714 Abdominal aortic aneurysm, without rupture, unspecified: Secondary | ICD-10-CM | POA: Diagnosis present

## 2015-12-27 DIAGNOSIS — K219 Gastro-esophageal reflux disease without esophagitis: Secondary | ICD-10-CM | POA: Diagnosis present

## 2015-12-27 DIAGNOSIS — R778 Other specified abnormalities of plasma proteins: Secondary | ICD-10-CM | POA: Insufficient documentation

## 2015-12-27 DIAGNOSIS — N189 Chronic kidney disease, unspecified: Secondary | ICD-10-CM | POA: Diagnosis present

## 2015-12-27 DIAGNOSIS — R0902 Hypoxemia: Secondary | ICD-10-CM | POA: Diagnosis not present

## 2015-12-27 DIAGNOSIS — I2699 Other pulmonary embolism without acute cor pulmonale: Secondary | ICD-10-CM

## 2015-12-27 DIAGNOSIS — I739 Peripheral vascular disease, unspecified: Secondary | ICD-10-CM | POA: Diagnosis present

## 2015-12-27 DIAGNOSIS — E876 Hypokalemia: Secondary | ICD-10-CM | POA: Diagnosis not present

## 2015-12-27 DIAGNOSIS — Z515 Encounter for palliative care: Secondary | ICD-10-CM | POA: Insufficient documentation

## 2015-12-27 DIAGNOSIS — I6350 Cerebral infarction due to unspecified occlusion or stenosis of unspecified cerebral artery: Secondary | ICD-10-CM | POA: Diagnosis present

## 2015-12-27 DIAGNOSIS — I2510 Atherosclerotic heart disease of native coronary artery without angina pectoris: Secondary | ICD-10-CM | POA: Diagnosis present

## 2015-12-27 DIAGNOSIS — Z79899 Other long term (current) drug therapy: Secondary | ICD-10-CM

## 2015-12-27 DIAGNOSIS — I129 Hypertensive chronic kidney disease with stage 1 through stage 4 chronic kidney disease, or unspecified chronic kidney disease: Secondary | ICD-10-CM | POA: Diagnosis present

## 2015-12-27 DIAGNOSIS — Z7189 Other specified counseling: Secondary | ICD-10-CM | POA: Insufficient documentation

## 2015-12-27 DIAGNOSIS — S72142A Displaced intertrochanteric fracture of left femur, initial encounter for closed fracture: Secondary | ICD-10-CM | POA: Diagnosis present

## 2015-12-27 DIAGNOSIS — Z955 Presence of coronary angioplasty implant and graft: Secondary | ICD-10-CM | POA: Diagnosis not present

## 2015-12-27 DIAGNOSIS — R1010 Upper abdominal pain, unspecified: Secondary | ICD-10-CM | POA: Diagnosis not present

## 2015-12-27 DIAGNOSIS — J9811 Atelectasis: Secondary | ICD-10-CM | POA: Diagnosis not present

## 2015-12-27 DIAGNOSIS — R748 Abnormal levels of other serum enzymes: Secondary | ICD-10-CM | POA: Diagnosis not present

## 2015-12-27 DIAGNOSIS — M6281 Muscle weakness (generalized): Secondary | ICD-10-CM | POA: Diagnosis not present

## 2015-12-27 DIAGNOSIS — E039 Hypothyroidism, unspecified: Secondary | ICD-10-CM | POA: Diagnosis not present

## 2015-12-27 DIAGNOSIS — S72002D Fracture of unspecified part of neck of left femur, subsequent encounter for closed fracture with routine healing: Secondary | ICD-10-CM

## 2015-12-27 DIAGNOSIS — R0602 Shortness of breath: Secondary | ICD-10-CM | POA: Diagnosis not present

## 2015-12-27 DIAGNOSIS — J9601 Acute respiratory failure with hypoxia: Secondary | ICD-10-CM

## 2015-12-27 DIAGNOSIS — I82412 Acute embolism and thrombosis of left femoral vein: Secondary | ICD-10-CM | POA: Diagnosis present

## 2015-12-27 DIAGNOSIS — R262 Difficulty in walking, not elsewhere classified: Secondary | ICD-10-CM | POA: Diagnosis not present

## 2015-12-27 DIAGNOSIS — E7800 Pure hypercholesterolemia, unspecified: Secondary | ICD-10-CM | POA: Diagnosis present

## 2015-12-27 DIAGNOSIS — N390 Urinary tract infection, site not specified: Secondary | ICD-10-CM | POA: Diagnosis present

## 2015-12-27 DIAGNOSIS — R042 Hemoptysis: Secondary | ICD-10-CM | POA: Diagnosis not present

## 2015-12-27 DIAGNOSIS — I2129 ST elevation (STEMI) myocardial infarction involving other sites: Secondary | ICD-10-CM | POA: Diagnosis not present

## 2015-12-27 DIAGNOSIS — Z7902 Long term (current) use of antithrombotics/antiplatelets: Secondary | ICD-10-CM | POA: Diagnosis not present

## 2015-12-27 DIAGNOSIS — R109 Unspecified abdominal pain: Secondary | ICD-10-CM | POA: Diagnosis not present

## 2015-12-27 DIAGNOSIS — J449 Chronic obstructive pulmonary disease, unspecified: Secondary | ICD-10-CM | POA: Diagnosis present

## 2015-12-27 DIAGNOSIS — I2601 Septic pulmonary embolism with acute cor pulmonale: Secondary | ICD-10-CM

## 2015-12-27 DIAGNOSIS — F329 Major depressive disorder, single episode, unspecified: Secondary | ICD-10-CM | POA: Diagnosis present

## 2015-12-27 DIAGNOSIS — R0689 Other abnormalities of breathing: Secondary | ICD-10-CM

## 2015-12-27 DIAGNOSIS — N179 Acute kidney failure, unspecified: Secondary | ICD-10-CM | POA: Diagnosis not present

## 2015-12-27 DIAGNOSIS — Z720 Tobacco use: Secondary | ICD-10-CM

## 2015-12-27 DIAGNOSIS — I350 Nonrheumatic aortic (valve) stenosis: Secondary | ICD-10-CM | POA: Diagnosis not present

## 2015-12-27 DIAGNOSIS — I2782 Chronic pulmonary embolism: Secondary | ICD-10-CM

## 2015-12-27 DIAGNOSIS — I82409 Acute embolism and thrombosis of unspecified deep veins of unspecified lower extremity: Secondary | ICD-10-CM | POA: Diagnosis not present

## 2015-12-27 DIAGNOSIS — S72009S Fracture of unspecified part of neck of unspecified femur, sequela: Secondary | ICD-10-CM | POA: Diagnosis not present

## 2015-12-27 DIAGNOSIS — Z8673 Personal history of transient ischemic attack (TIA), and cerebral infarction without residual deficits: Secondary | ICD-10-CM

## 2015-12-27 DIAGNOSIS — G9341 Metabolic encephalopathy: Secondary | ICD-10-CM | POA: Diagnosis not present

## 2015-12-27 DIAGNOSIS — D649 Anemia, unspecified: Secondary | ICD-10-CM | POA: Diagnosis present

## 2015-12-27 DIAGNOSIS — R41841 Cognitive communication deficit: Secondary | ICD-10-CM | POA: Diagnosis not present

## 2015-12-27 DIAGNOSIS — R0600 Dyspnea, unspecified: Secondary | ICD-10-CM | POA: Diagnosis not present

## 2015-12-27 DIAGNOSIS — S72009A Fracture of unspecified part of neck of unspecified femur, initial encounter for closed fracture: Secondary | ICD-10-CM | POA: Diagnosis present

## 2015-12-27 DIAGNOSIS — S72145D Nondisplaced intertrochanteric fracture of left femur, subsequent encounter for closed fracture with routine healing: Secondary | ICD-10-CM | POA: Diagnosis not present

## 2015-12-27 DIAGNOSIS — I214 Non-ST elevation (NSTEMI) myocardial infarction: Secondary | ICD-10-CM | POA: Diagnosis not present

## 2015-12-27 DIAGNOSIS — E0781 Sick-euthyroid syndrome: Secondary | ICD-10-CM | POA: Diagnosis present

## 2015-12-27 DIAGNOSIS — G9340 Encephalopathy, unspecified: Secondary | ICD-10-CM

## 2015-12-27 DIAGNOSIS — I2690 Septic pulmonary embolism without acute cor pulmonale: Secondary | ICD-10-CM | POA: Diagnosis not present

## 2015-12-27 DIAGNOSIS — I2119 ST elevation (STEMI) myocardial infarction involving other coronary artery of inferior wall: Secondary | ICD-10-CM | POA: Diagnosis not present

## 2015-12-27 DIAGNOSIS — Z66 Do not resuscitate: Secondary | ICD-10-CM | POA: Diagnosis present

## 2015-12-27 DIAGNOSIS — Z7982 Long term (current) use of aspirin: Secondary | ICD-10-CM

## 2015-12-27 DIAGNOSIS — R402421 Glasgow coma scale score 9-12, in the field [EMT or ambulance]: Secondary | ICD-10-CM | POA: Diagnosis not present

## 2015-12-27 DIAGNOSIS — J9690 Respiratory failure, unspecified, unspecified whether with hypoxia or hypercapnia: Secondary | ICD-10-CM

## 2015-12-27 DIAGNOSIS — R2689 Other abnormalities of gait and mobility: Secondary | ICD-10-CM | POA: Diagnosis not present

## 2015-12-27 DIAGNOSIS — W19XXXA Unspecified fall, initial encounter: Secondary | ICD-10-CM | POA: Diagnosis present

## 2015-12-27 DIAGNOSIS — M25559 Pain in unspecified hip: Secondary | ICD-10-CM | POA: Diagnosis not present

## 2015-12-27 DIAGNOSIS — Z9181 History of falling: Secondary | ICD-10-CM | POA: Diagnosis not present

## 2015-12-27 DIAGNOSIS — J80 Acute respiratory distress syndrome: Secondary | ICD-10-CM | POA: Diagnosis not present

## 2015-12-27 DIAGNOSIS — R7989 Other specified abnormal findings of blood chemistry: Secondary | ICD-10-CM

## 2015-12-27 DIAGNOSIS — R1312 Dysphagia, oropharyngeal phase: Secondary | ICD-10-CM | POA: Diagnosis not present

## 2015-12-27 LAB — COMPREHENSIVE METABOLIC PANEL
ALK PHOS: 72 U/L (ref 38–126)
ALT: 22 U/L (ref 17–63)
AST: 40 U/L (ref 15–41)
Albumin: 3.3 g/dL — ABNORMAL LOW (ref 3.5–5.0)
Anion gap: 12 (ref 5–15)
BILIRUBIN TOTAL: 1.1 mg/dL (ref 0.3–1.2)
BUN: 33 mg/dL — AB (ref 6–20)
CALCIUM: 9.8 mg/dL (ref 8.9–10.3)
CO2: 24 mmol/L (ref 22–32)
Chloride: 101 mmol/L (ref 101–111)
Creatinine, Ser: 1.51 mg/dL — ABNORMAL HIGH (ref 0.61–1.24)
GFR calc Af Amer: 48 mL/min — ABNORMAL LOW (ref >60)
GFR, EST NON AFRICAN AMERICAN: 42 mL/min — AB (ref >60)
Glucose, Bld: 124 mg/dL — ABNORMAL HIGH (ref 65–99)
POTASSIUM: 4.9 mmol/L (ref 3.5–5.1)
Sodium: 137 mmol/L (ref 135–145)
TOTAL PROTEIN: 7.8 g/dL (ref 6.5–8.1)

## 2015-12-27 LAB — CBC WITH DIFFERENTIAL/PLATELET
BASOS ABS: 0 10*3/uL (ref 0.0–0.1)
BASOS PCT: 0 %
EOS ABS: 0.5 10*3/uL (ref 0.0–0.7)
EOS PCT: 5 %
HCT: 37.8 % — ABNORMAL LOW (ref 39.0–52.0)
Hemoglobin: 12.8 g/dL — ABNORMAL LOW (ref 13.0–17.0)
Lymphocytes Relative: 21 %
Lymphs Abs: 2 10*3/uL (ref 0.7–4.0)
MCH: 30.8 pg (ref 26.0–34.0)
MCHC: 33.9 g/dL (ref 30.0–36.0)
MCV: 90.9 fL (ref 78.0–100.0)
MONO ABS: 1 10*3/uL (ref 0.1–1.0)
MONOS PCT: 10 %
Neutro Abs: 6.3 10*3/uL (ref 1.7–7.7)
Neutrophils Relative %: 64 %
PLATELETS: 177 10*3/uL (ref 150–400)
RBC: 4.16 MIL/uL — ABNORMAL LOW (ref 4.22–5.81)
RDW: 13.2 % (ref 11.5–15.5)
WBC: 9.9 10*3/uL (ref 4.0–10.5)

## 2015-12-27 LAB — URINE MICROSCOPIC-ADD ON

## 2015-12-27 LAB — URINALYSIS, ROUTINE W REFLEX MICROSCOPIC
Glucose, UA: NEGATIVE mg/dL
KETONES UR: NEGATIVE mg/dL
NITRITE: NEGATIVE
PH: 5.5 (ref 5.0–8.0)
Protein, ur: 30 mg/dL — AB
SPECIFIC GRAVITY, URINE: 1.03 (ref 1.005–1.030)

## 2015-12-27 LAB — I-STAT ARTERIAL BLOOD GAS, ED
BICARBONATE: 24.9 meq/L — AB (ref 20.0–24.0)
O2 Saturation: 82 %
PH ART: 7.414 (ref 7.350–7.450)
TCO2: 26 mmol/L (ref 0–100)
pCO2 arterial: 38.9 mmHg (ref 35.0–45.0)
pO2, Arterial: 45 mmHg — ABNORMAL LOW (ref 80.0–100.0)

## 2015-12-27 LAB — I-STAT TROPONIN, ED: Troponin i, poc: 0.74 ng/mL (ref 0.00–0.08)

## 2015-12-27 LAB — I-STAT CG4 LACTIC ACID, ED: LACTIC ACID, VENOUS: 1.48 mmol/L (ref 0.5–2.0)

## 2015-12-27 MED ORDER — ALBUTEROL SULFATE (2.5 MG/3ML) 0.083% IN NEBU
5.0000 mg | INHALATION_SOLUTION | Freq: Once | RESPIRATORY_TRACT | Status: AC
  Administered 2015-12-27: 5 mg via RESPIRATORY_TRACT
  Filled 2015-12-27: qty 6

## 2015-12-27 MED ORDER — FUROSEMIDE 10 MG/ML IJ SOLN
20.0000 mg | Freq: Once | INTRAMUSCULAR | Status: AC
  Administered 2015-12-27: 20 mg via INTRAVENOUS
  Filled 2015-12-27: qty 2

## 2015-12-27 MED ORDER — SODIUM CHLORIDE 0.9 % IV BOLUS (SEPSIS)
500.0000 mL | Freq: Once | INTRAVENOUS | Status: AC
  Administered 2015-12-27: 500 mL via INTRAVENOUS

## 2015-12-27 MED ORDER — ONDANSETRON HCL 4 MG PO TABS: 4.0000 mg | ORAL_TABLET | Freq: Four times a day (QID) | ORAL | Status: DC | PRN

## 2015-12-27 MED ORDER — PANTOPRAZOLE SODIUM 40 MG IV SOLR
40.0000 mg | Freq: Every day | INTRAVENOUS | Status: DC
  Administered 2015-12-28 – 2015-12-31 (×5): 40 mg via INTRAVENOUS
  Filled 2015-12-27 (×6): qty 40

## 2015-12-27 MED ORDER — SODIUM CHLORIDE 0.9 % IV SOLN
INTRAVENOUS | Status: DC
  Administered 2015-12-27: 22:00:00 via INTRAVENOUS

## 2015-12-27 MED ORDER — IPRATROPIUM-ALBUTEROL 0.5-2.5 (3) MG/3ML IN SOLN
3.0000 mL | Freq: Once | RESPIRATORY_TRACT | Status: AC
  Administered 2015-12-27: 3 mL via RESPIRATORY_TRACT
  Filled 2015-12-27: qty 3

## 2015-12-27 MED ORDER — MORPHINE SULFATE (PF) 2 MG/ML IV SOLN: 1.0000 mg | INTRAVENOUS | Status: DC | PRN

## 2015-12-27 MED ORDER — MIDAZOLAM HCL 2 MG/2ML IJ SOLN
INTRAMUSCULAR | Status: AC
  Administered 2015-12-27: 2 mg
  Filled 2015-12-27: qty 2

## 2015-12-27 MED ORDER — HEPARIN (PORCINE) IN NACL 100-0.45 UNIT/ML-% IJ SOLN
1000.0000 [IU]/h | INTRAMUSCULAR | Status: DC
  Administered 2015-12-27 – 2015-12-30 (×4): 1000 [IU]/h via INTRAVENOUS
  Filled 2015-12-27 (×7): qty 250

## 2015-12-27 MED ORDER — PROPOFOL 1000 MG/100ML IV EMUL
INTRAVENOUS | Status: AC
  Administered 2015-12-27: 20 ug/kg/min
  Filled 2015-12-27: qty 100

## 2015-12-27 MED ORDER — HYDRALAZINE HCL 20 MG/ML IJ SOLN: 5.0000 mg | INTRAMUSCULAR | Status: DC | PRN

## 2015-12-27 MED ORDER — ARFORMOTEROL TARTRATE 15 MCG/2ML IN NEBU
15.0000 ug | INHALATION_SOLUTION | Freq: Two times a day (BID) | RESPIRATORY_TRACT | Status: DC
  Administered 2015-12-28 – 2016-01-02 (×11): 15 ug via RESPIRATORY_TRACT
  Filled 2015-12-27 (×12): qty 2

## 2015-12-27 MED ORDER — ASPIRIN 300 MG RE SUPP: 150.0000 mg | Freq: Every day | RECTAL | Status: DC

## 2015-12-27 MED ORDER — CLOPIDOGREL BISULFATE 75 MG PO TABS
75.0000 mg | ORAL_TABLET | Freq: Every day | ORAL | Status: DC
  Administered 2015-12-28 – 2015-12-30 (×3): 75 mg
  Filled 2015-12-27 (×3): qty 1

## 2015-12-27 MED ORDER — ACETAMINOPHEN 650 MG RE SUPP: 650.0000 mg | Freq: Four times a day (QID) | RECTAL | Status: DC | PRN

## 2015-12-27 MED ORDER — ONDANSETRON HCL 4 MG/2ML IJ SOLN: 4.0000 mg | Freq: Four times a day (QID) | INTRAMUSCULAR | Status: DC | PRN

## 2015-12-27 MED ORDER — BUDESONIDE 0.5 MG/2ML IN SUSP
0.5000 mg | Freq: Two times a day (BID) | RESPIRATORY_TRACT | Status: DC
  Administered 2015-12-28 – 2016-01-02 (×11): 0.5 mg via RESPIRATORY_TRACT
  Filled 2015-12-27 (×11): qty 2

## 2015-12-27 MED ORDER — DEXTROSE 5 % IV SOLN
1.0000 g | INTRAVENOUS | Status: DC
  Administered 2015-12-27: 1 g via INTRAVENOUS
  Filled 2015-12-27: qty 10

## 2015-12-27 MED ORDER — FENTANYL CITRATE (PF) 100 MCG/2ML IJ SOLN: 50.0000 ug | INTRAMUSCULAR | Status: DC | PRN

## 2015-12-27 MED ORDER — LEVOTHYROXINE SODIUM 100 MCG IV SOLR
56.0000 ug | Freq: Every day | INTRAVENOUS | Status: DC
  Administered 2015-12-29 – 2015-12-31 (×3): 56 ug via INTRAVENOUS
  Filled 2015-12-27 (×3): qty 5

## 2015-12-27 MED ORDER — SODIUM CHLORIDE 0.9 % IV SOLN
INTRAVENOUS | Status: DC
  Administered 2015-12-28 – 2015-12-29 (×4): via INTRAVENOUS

## 2015-12-27 MED ORDER — METOPROLOL TARTRATE 1 MG/ML IV SOLN
2.5000 mg | Freq: Three times a day (TID) | INTRAVENOUS | Status: DC
  Administered 2015-12-27: 2.5 mg via INTRAVENOUS
  Filled 2015-12-27: qty 5

## 2015-12-27 MED ORDER — ALBUTEROL SULFATE (2.5 MG/3ML) 0.083% IN NEBU: 2.5000 mg | INHALATION_SOLUTION | RESPIRATORY_TRACT | Status: DC | PRN

## 2015-12-27 MED ORDER — ETOMIDATE 2 MG/ML IV SOLN
20.0000 mg | Freq: Once | INTRAVENOUS | Status: AC
  Administered 2015-12-27: 20 mg via INTRAVENOUS

## 2015-12-27 MED ORDER — SODIUM CHLORIDE 0.9% FLUSH
3.0000 mL | Freq: Two times a day (BID) | INTRAVENOUS | Status: DC
  Administered 2015-12-28 – 2015-12-29 (×2): 3 mL via INTRAVENOUS

## 2015-12-27 MED ORDER — ANTISEPTIC ORAL RINSE SOLUTION (CORINZ)
7.0000 mL | Freq: Four times a day (QID) | OROMUCOSAL | Status: DC
  Administered 2015-12-28 – 2015-12-29 (×7): 7 mL via OROMUCOSAL

## 2015-12-27 MED ORDER — ACETAMINOPHEN 325 MG PO TABS: 650.0000 mg | ORAL_TABLET | Freq: Four times a day (QID) | ORAL | Status: DC | PRN

## 2015-12-27 MED ORDER — CHLORHEXIDINE GLUCONATE 0.12% ORAL RINSE (MEDLINE KIT)
15.0000 mL | Freq: Two times a day (BID) | OROMUCOSAL | Status: DC
  Administered 2015-12-28 – 2015-12-29 (×4): 15 mL via OROMUCOSAL

## 2015-12-27 MED ORDER — LEVOTHYROXINE SODIUM 100 MCG IV SOLR
50.0000 ug | Freq: Every day | INTRAVENOUS | Status: DC
  Filled 2015-12-27: qty 5

## 2015-12-27 MED ORDER — ASPIRIN 325 MG PO TABS
325.0000 mg | ORAL_TABLET | Freq: Every day | ORAL | Status: DC
  Administered 2015-12-28 – 2016-01-01 (×5): 325 mg
  Filled 2015-12-27 (×5): qty 1

## 2015-12-27 MED ORDER — DOCUSATE SODIUM 100 MG PO CAPS
100.0000 mg | ORAL_CAPSULE | Freq: Two times a day (BID) | ORAL | Status: DC
Start: 2015-12-27 — End: 2015-12-28
  Filled 2015-12-27 (×2): qty 1

## 2015-12-27 MED ORDER — HYDRALAZINE HCL 20 MG/ML IJ SOLN: 10.0000 mg | INTRAMUSCULAR | Status: DC | PRN

## 2015-12-27 MED ORDER — SODIUM CHLORIDE 0.9 % IV SOLN: 250.0000 mL | INTRAVENOUS | Status: DC | PRN

## 2015-12-27 MED ORDER — HEPARIN BOLUS VIA INFUSION
3000.0000 [IU] | Freq: Once | INTRAVENOUS | Status: AC
  Administered 2015-12-27: 3000 [IU] via INTRAVENOUS
  Filled 2015-12-27: qty 3000

## 2015-12-27 MED ORDER — PANTOPRAZOLE SODIUM 40 MG IV SOLR
40.0000 mg | INTRAVENOUS | Status: DC
  Administered 2015-12-27: 40 mg via INTRAVENOUS
  Filled 2015-12-27: qty 40

## 2015-12-27 MED ORDER — FENTANYL CITRATE (PF) 100 MCG/2ML IJ SOLN
50.0000 ug | INTRAMUSCULAR | Status: DC | PRN
  Administered 2015-12-28 – 2015-12-29 (×5): 50 ug via INTRAVENOUS
  Filled 2015-12-27 (×6): qty 2

## 2015-12-27 MED ORDER — SUCCINYLCHOLINE CHLORIDE 20 MG/ML IJ SOLN
150.0000 mg | Freq: Once | INTRAMUSCULAR | Status: AC
  Administered 2015-12-27: 150 mg via INTRAVENOUS
  Filled 2015-12-27: qty 7.5

## 2015-12-27 MED ORDER — ATORVASTATIN CALCIUM 80 MG PO TABS
80.0000 mg | ORAL_TABLET | Freq: Every day | ORAL | Status: DC
  Administered 2015-12-28: 80 mg
  Filled 2015-12-27 (×3): qty 1

## 2015-12-27 NOTE — ED Provider Notes (Addendum)
CSN: [id number]     Arrival date & time 12/27/15  1856 History   First MD Initiated Contact with Patient 12/27/15 1857     Chief Complaint  Patient presents with  . Altered Mental Status     (Consider location/radiation/quality/duration/timing/severity/associated sxs/prior Treatment) HPI Comments: Patient is an 82 year old male with past medical history of dementia, coronary artery disease, and recent hospitalization for a left hip fracture. The decision was made to treat this hip fracture nonoperatively secondary to the patient being high risk for serious complication. He was then sent to a rehabilitation facility where he has been for the past 2 days. He became more confused this afternoon and was found to have low oxygen saturations which did not improve with supplemental O2. For this reason, he was sent here to be evaluated. The patient adds little additional information due to dementia. His daughter who is present at bedside does report some cough over the past several days, but no fevers.  Patient is a 82 y.o. male presenting with altered mental status. The history is provided by the patient and a relative.  Altered Mental Status Presenting symptoms: confusion   Severity:  Moderate Most recent episode:  Today Episode history:  Continuous Timing:  Constant Progression:  Unchanged Context: dementia     Past Medical History  Diagnosis Date  . Coronary artery disease     LAD w/ Ca++, 100% RCA, in-astent stenosis Cx, high grade lesion Ramus  . Hypercholesteremia   . Dyspnea   . Hypothyroid   . CVA (cerebral infarction)   . Arthritis     hips, will give out  . Varicella   . Depression     never took any medication.  . Emphysema of lung (HCC)     smoking related.  . Hypertension   . Stroke [facility])     left hemiparesis-100% recovery  . AAA (abdominal aortic aneurysm) (HCC)     small  . Severe aortic stenosis 2012   Past Surgical History  Procedure Laterality Date  .  Coronary angioplasty with stent placement      Several stents, last in 2004   Family History  Problem Relation Age of Onset  . Coronary artery disease    . Hyperlipidemia Mother   . Heart disease Mother     had artifical valve  . Heart disease Brother   . Hyperlipidemia Brother   . COPD Brother   . Heart disease Brother     CABG, valve replacement  . Alcohol abuse Brother   . Heart attack Mother   . Hypertension Mother    Social History  Substance Use Topics  . Smoking status: Former Smoker    Quit date: 09/09/1987  . Smokeless tobacco: Current User  . Alcohol Use: No    Review of Systems  Psychiatric/Behavioral: Positive for confusion.  All other systems reviewed and are negative.     Allergies  Prednisone  Home Medications   Prior to Admission medications   Medication Sig Start Date End Date Taking? Authorizing Provider  acetaminophen (TYLENOL) 325 MG tablet Take 2 tablets (650 mg total) by mouth every 4 (four) hours as needed for mild pain, fever or headache. 03/21/14   Abelino Derrick, PA-C  amLODipine (NORVASC) 10 MG tablet Take 10 mg by mouth every morning.     Historical Provider, MD  aspirin 325 MG tablet Take 325 mg by mouth daily.    Historical Provider, MD  atorvastatin (LIPITOR) 80 MG tablet Take 80 mg  by mouth every morning.    Historical Provider, MD  baclofen (LIORESAL) 10 MG tablet Take 1 tablet (10 mg total) by mouth 3 (three) times daily as needed for muscle spasms. 12/25/15   Leroy Sea, MD  budesonide-formoterol (SYMBICORT) 160-4.5 MCG/ACT inhaler Inhale 2 puffs into the lungs 2 (two) times daily as needed (for shortness of breath). Reported on 09/13/2015 09/13/15   Renee A Kuneff, DO  clopidogrel (PLAVIX) 75 MG tablet Take 75 mg by mouth every morning.     Historical Provider, MD  docusate sodium (COLACE) 100 MG capsule Take 1 capsule (100 mg total) by mouth 2 (two) times daily. 12/25/15   Leroy Sea, MD  isosorbide mononitrate (IMDUR) 30 MG 24  hr tablet Take 30 mg by mouth every morning.    Historical Provider, MD  levothyroxine (SYNTHROID, LEVOTHROID) 112 MCG tablet Take 112 mcg by mouth daily before breakfast.     Historical Provider, MD  metoprolol (LOPRESSOR) 50 MG tablet Take 0.5 tablets (25 mg total) by mouth 2 (two) times daily. 03/21/14   Abelino Derrick, PA-C  morphine (MS CONTIN) 30 MG 12 hr tablet Take 1 tablet (30 mg total) by mouth every 12 (twelve) hours. 12/25/15   Leroy Sea, MD  niacin (SLO-NIACIN) 500 MG tablet Take 500 mg by mouth every morning.     Historical Provider, MD  nitroGLYCERIN (NITROSTAT) 0.4 MG SL tablet Place 1 tablet (0.4 mg total) under the tongue every 5 (five) minutes as needed for chest pain. 04/13/14   Pecola Lawless, MD  oxyCODONE (OXY IR/ROXICODONE) 5 MG immediate release tablet Take 1-2 tablets (5-10 mg total) by mouth every 4 (four) hours as needed for breakthrough pain ((for MODERATE breakthrough pain)). 12/25/15   Leroy Sea, MD  pantoprazole (PROTONIX) 40 MG tablet Take 1 tablet (40 mg total) by mouth daily. 12/25/15   Leroy Sea, MD  vitamin E 400 UNIT capsule Take 800 Units by mouth daily.    Historical Provider, MD   BP 158/79 mmHg  Pulse 105  Temp(Src) 97.9 F (36.6 C) (Oral)  Resp 18  Ht  (1.778 m)  Wt 149 lb (67.586 kg)  BMI 21.38 kg/m2  SpO2 91% Physical Exam  Constitutional: He is oriented to person, place, and time. He appears well-developed and well-nourished. No distress.  HENT:  Head: Normocephalic and atraumatic.  Mucous membranes appear somewhat dry.  Neck: Normal range of motion. Neck supple.  Cardiovascular: Normal rate, regular rhythm and normal heart sounds.   No murmur heard. Pulmonary/Chest: Effort normal. No respiratory distress. He has no rales.  There are bilateral rhonchi present.  Abdominal: Soft. Bowel sounds are normal. He exhibits no distension. There is no tenderness. There is no rebound.  Musculoskeletal: Normal range of motion. He  exhibits no edema.  Neurological: He is alert and oriented to person, place, and time.  Skin: Skin is warm and dry. He is not diaphoretic.  Nursing note and vitals reviewed.   ED Course  Procedures (including critical care time) Labs Review Labs Reviewed  CULTURE, BLOOD (ROUTINE X 2)  CULTURE, BLOOD (ROUTINE X 2)  URINE CULTURE  COMPREHENSIVE METABOLIC PANEL  CBC WITH DIFFERENTIAL/PLATELET  URINALYSIS, ROUTINE W REFLEX MICROSCOPIC (NOT AT [facility])  I-STAT CG4 LACTIC ACID, ED  I-STAT TROPOININ, ED    Imaging Review No results found. I have personally reviewed and evaluated these images and lab results as part of my medical decision-making.   EKG Interpretation   Date/Time:  Thursday December 27 2015 19:06:24 EDT Ventricular Rate:  97 PR Interval:    QRS Duration: 105 QT Interval:  354 QTC Calculation: 450 R Axis:   65 Text Interpretation:  Atrial fibrillation Inferoposterior infarct, recent  Abnormal lateral Q waves Abnrm T, consider ischemia, anterolateral lds  Confirmed by Dezi Schaner  MD, Sonnie Pawloski ([id number]) on 12/27/2015 9:15:05 PM      MDM   Final diagnoses:  None    Patient presents with complaints of confusion and low O2 saturations. He is hypoxic and requires supplemental oxygen. As this patient recently sustained a fracture of his hip, he is at risk for pulmonary embolism. He also has an elevated troponin. For both of these reasons, heparin will be started. Due to the patient's renal function, he will likely undergo a VQ scan tomorrow instead of CT scan.    Geoffery Lyonsouglas Meriah Shands, MD 12/27/15 2117  ADDENDUM: After the patient was seen by the hospitalist, his condition continued to deteriorate. He became more hypoxic and required greater supplemental oxygen. Eventually he was on a nonrebreather and continue to remain hypoxic. An ABG was obtained which revealed significant hypoxia. In consultation with the hospitalist and intensivist, the decision was made to proceed with  intubation.  INTUBATION Performed by: Geoffery LyonseLo, Aunya Lemler  Required items: required blood products, implants, devices, and special equipment available Patient identity confirmed: provided demographic data and hospital-assigned identification number Time out: Immediately prior to procedure a "time out" was called to verify the correct patient, procedure, equipment, support staff and site/side marked as required.  Indications: Respiratory failure/hypoxia   Intubation method: McIntosh blade #3   Preoxygenation: BVM  Sedatives: 20 mg Etomidate Paralytic: 150 Succinylcholine  Tube Size: 7.5 cuffed  Post-procedure assessment: chest rise and ETCO2 monitor Breath sounds: equal and absent over the epigastrium Tube secured with: ETT holder Chest x-ray interpreted by radiologist and me.  Chest x-ray findings: endotracheal tube in appropriate position  Patient tolerated the procedure well with no immediate complications.  CRITICAL CARE Performed by: Geoffery LyonseLo, Dany Harten Total critical care time: 60 minutes Critical care time was exclusive of separately billable procedures and treating other patients. Critical care was necessary to treat or prevent imminent or life-threatening deterioration. Critical care was time spent personally by me on the following activities: development of treatment plan with patient and/or surrogate as well as nursing, discussions with consultants, evaluation of patient's response to treatment, examination of patient, obtaining history from patient or surrogate, ordering and performing treatments and interventions, ordering and review of laboratory studies, ordering and review of radiographic studies, pulse oximetry and re-evaluation of patient's condition.       Geoffery Lyonsouglas Marene Gilliam, MD 12/27/15 [phone]

## 2015-12-27 NOTE — ED Notes (Signed)
Pt. [facility] for altered mental status and low oxygen saturation starting today. Pt. Seen here recently for femur head fracture. Pt. Sent home in splint with follow up to ortho. Pt. Oxygen saturation 90% at facility. Pt. Given 2L oxygen via [place] with no improvement and EMS was called at that time. EMS reports rhonchi in all lung fields. Pt. Put on NRB en route and oxygen saturation up to 97%. Pt. Has foley on assessment. Pt. Non-verbal at this time.

## 2015-12-27 NOTE — Procedures (Signed)
Intubation Procedure Note Osvaldo ShipperRichard C Eveland [id number] 06/06/1932  Procedure: Intubation Indications: Respiratory insufficiency  Procedure Details Consent: Risks of procedure as well as the alternatives and risks of each were explained to the (patient/caregiver).  Consent for procedure obtained. Time Out: Verified patient identification, verified procedure, site/side was marked, verified correct patient position, special equipment/implants available, medications/allergies/relevent history reviewed, required imaging and test results available.  Performed  Maximum sterile technique was used including gloves and hand hygiene.  MAC and 3    Evaluation Hemodynamic Status: BP stable throughout; O2 sats: stable throughout Patient's Current Condition: stable Complications: No apparent complications Patient did tolerate procedure well. Chest X-ray ordered to verify placement.  CXR: tube position acceptable.   Morley KosJohnson, Nolene Rocks Leroy 12/27/2015

## 2015-12-27 NOTE — ED Notes (Signed)
Family notified RN that foley catheter placed today.

## 2015-12-27 NOTE — ED Notes (Signed)
EDP at bedside  

## 2015-12-27 NOTE — ED Notes (Signed)
Portable CXR at bedside at this time

## 2015-12-27 NOTE — Progress Notes (Signed)
ANTICOAGULATION CONSULT NOTE - Initial Consult  Pharmacy Consult for Heparin Indication: pulmonary embolus (rule out)  Allergies  Allergen Reactions  . Prednisone Palpitations    See 02/07/14 he feels this caused chest pain    Patient Measurements: Height: 5\' 10"  (177.8 cm) Weight: 149 lb (67.586 kg) IBW/kg (Calculated) : 73   Vital Signs: Temp: 97.9 F (36.6 C) (04/20 1901) Temp Source: Oral (04/20 1901) BP: 158/79 mmHg (04/20 1901) Pulse Rate: 103 (04/20 1901)  Labs:  Recent Labs  12/27/15 1900  HGB 12.8*  HCT 37.8*  PLT 177  CREATININE 1.51*    Estimated Creatinine Clearance: 36.7 mL/min (by C-G formula based on Cr of 1.51).   Medical History: Past Medical History  Diagnosis Date  . Coronary artery disease     LAD w/ Ca++, 100% RCA, in-astent stenosis Cx, high grade lesion Ramus  . Hypercholesteremia   . Dyspnea   . Hypothyroid   . CVA (cerebral infarction)   . Arthritis     hips, will give out  . Varicella   . Depression     never took any medication.  . Emphysema of lung (HCC)     smoking related.  . Hypertension   . Stroke [facility](HCC)     left hemiparesis-100% recovery  . AAA (abdominal aortic aneurysm) (HCC)     small  . Severe aortic stenosis 2012    Assessment: 82 year old male s/p recent hospitalization for left hip fracture returns with low oxygen saturations and confusion.  Pharmacy asked to start heparin for possible pulmonary embolus  PMH: Dementia, CAD  Goal of Therapy:  Heparin level 0.3-0.7 units/ml Monitor platelets by anticoagulation protocol: Yes   Plan:  Heparin 3000 units iv bolus x 1 Heparin drip at 1000 units / hr Heparin level and CBC daily  Thank you Okey RegalLisa Jase Reep, PharmD [phone]  12/27/2015,9:39 PM

## 2015-12-27 NOTE — H&P (Signed)
PULMONARY / CRITICAL CARE MEDICINE   Name: Maxwell ShipperRichard C Copeland MRN: [id number] DOB: 09/02/1932    ADMISSION DATE:  12/27/2015 CONSULTATION DATE:  12/27/15  REFERRING MD:  EDP  CHIEF COMPLAINT:  SOB  HISTORY OF PRESENT ILLNESS:  Pt is encephelopathic; therefore, this HPI is obtained from chart review. Maxwell ShipperRichard C Copeland is a 82 y.o. male with PMH as outlined below including recent left non-displaced intertrochanteric femur fx for which he was recently hospitalized 12/22/15 through 12/25/15.  Due to his extensive cardiac hx including CAD and severe aortic stenosis, he was deemed a high risk surgical candidate.  After discussions with family, decision was made to defer surgery and manage the fracture conservatively.  He was seen in consultation by Dr. [name] of orthopedics who instructed him to be NWB and to follow up with ortho as an outpatient 3 - 4 weeks post discharge.  He was discharged to SNF for rehab efforts, and on 12/27/15, he was brought to [facility] ED with AMS and hypoxia.  In ED, he had persistent hypoxia with SpO2 in the 70's despite NRB.  He was subsequently intubated by EDP and he was empirically started on heparin gtt for presumed PE given his hx of femur fx.  Due to his renal function, CTA of the chest was deferred at this time.  PAST MEDICAL HISTORY :  He  has a past medical history of Coronary artery disease; Hypercholesteremia; Dyspnea; Hypothyroid; CVA (cerebral infarction); Arthritis; Varicella; Depression; Emphysema of lung (HCC); Hypertension; Stroke [facility](HCC); AAA (abdominal aortic aneurysm) (HCC); and Severe aortic stenosis (2012).  PAST SURGICAL HISTORY: He  has past surgical history that includes Coronary angioplasty with stent.  Allergies  Allergen Reactions  . Prednisone Palpitations    See 02/07/14 he feels this caused chest pain    No current facility-administered medications on file prior to encounter.   Current Outpatient Prescriptions on File Prior to Encounter   Medication Sig  . acetaminophen (TYLENOL) 325 MG tablet Take 2 tablets (650 mg total) by mouth every 4 (four) hours as needed for mild pain, fever or headache.  Marland Kitchen. amLODipine (NORVASC) 10 MG tablet Take 10 mg by mouth every morning.   Marland Kitchen. aspirin 325 MG tablet Take 325 mg by mouth daily.  Marland Kitchen. atorvastatin (LIPITOR) 80 MG tablet Take 80 mg by mouth every morning.  . baclofen (LIORESAL) 10 MG tablet Take 1 tablet (10 mg total) by mouth 3 (three) times daily as needed for muscle spasms.  . budesonide-formoterol (SYMBICORT) 160-4.5 MCG/ACT inhaler Inhale 2 puffs into the lungs 2 (two) times daily as needed (for shortness of breath). Reported on 09/13/2015  . clopidogrel (PLAVIX) 75 MG tablet Take 75 mg by mouth every morning.   . docusate sodium (COLACE) 100 MG capsule Take 1 capsule (100 mg total) by mouth 2 (two) times daily.  . isosorbide mononitrate (IMDUR) 30 MG 24 hr tablet Take 30 mg by mouth every morning.  Marland Kitchen. levothyroxine (SYNTHROID, LEVOTHROID) 112 MCG tablet Take 112 mcg by mouth daily before breakfast.   . metoprolol (LOPRESSOR) 50 MG tablet Take 0.5 tablets (25 mg total) by mouth 2 (two) times daily.  . niacin (SLO-NIACIN) 500 MG tablet Take 500 mg by mouth every morning.   . nitroGLYCERIN (NITROSTAT) 0.4 MG SL tablet Place 1 tablet (0.4 mg total) under the tongue every 5 (five) minutes as needed for chest pain.  Marland Kitchen. oxyCODONE (OXY IR/ROXICODONE) 5 MG immediate release tablet Take 1-2 tablets (5-10 mg total) by mouth every 4 (four) hours  as needed for breakthrough pain ((for MODERATE breakthrough pain)).  . pantoprazole (PROTONIX) 40 MG tablet Take 1 tablet (40 mg total) by mouth daily.  . vitamin E 400 UNIT capsule Take 800 Units by mouth daily.  Marland Kitchen morphine (MS CONTIN) 30 MG 12 hr tablet Take 1 tablet (30 mg total) by mouth every 12 (twelve) hours.    FAMILY HISTORY:  His indicated that his mother is deceased. He indicated that his father is deceased. He indicated that all of his three  brothers are alive.   SOCIAL HISTORY: He  reports that he quit smoking about 28 years ago. He uses smokeless tobacco. He reports that he does not drink alcohol or use illicit drugs.  REVIEW OF SYSTEMS:   Unable to obtain as pt is encephalopathic.  SUBJECTIVE:  On vent, unresponsive.  VITAL SIGNS: BP 111/64 mmHg  Pulse 87  Temp(Src) 97.9 F (36.6 C) (Oral)  Resp 22  Ht  (1.778 m)  Wt 67.586 kg (149 lb)  BMI 21.38 kg/m2  SpO2 93%  HEMODYNAMICS:    VENTILATOR SETTINGS:    INTAKE / OUTPUT:     PHYSICAL EXAMINATION: General: Elderly male, chronically ill appearing, in NAD. Neuro:  Sedated, non-responsive. HEENT: [place]/AT. PERRL, sclerae anicteric. Cardiovascular: RRR, 4/6 SEM radiating into carotids bilaterally. Lungs: Respirations even and unlabored.  CTA bilaterally, No W/R/R. Abdomen: BS x 4, soft, NT/ND.  Musculoskeletal: Leg pillow in place due to left femur fx, no edema.  Skin: Intact, warm, no rashes.  LABS:  BMET  Recent Labs Lab 12/23/15 0259 12/24/15 0426 12/27/15 1900  NA 141 139 137  K 3.9 4.0 4.9  CL 107 105 101  CO2 BUN 15 16 33*  CREATININE 1.10 1.29* 1.51*  GLUCOSE 124* 99 124*    Electrolytes  Recent Labs Lab 12/23/15 0259 12/24/15 0426 12/27/15 1900  CALCIUM 9.2 9.1 9.8    CBC  Recent Labs Lab 12/23/15 0259 12/24/15 0426 12/27/15 1900  WBC 15.0* 8.7 9.9  HGB 11.8* 11.8* 12.8*  HCT 35.4* 35.9* 37.8*  PLT 138* 132* 177    Coag's  Recent Labs Lab 12/23/15 1030  INR 1.34    Sepsis Markers  Recent Labs Lab 12/27/15 1921  LATICACIDVEN 1.48    ABG  Recent Labs Lab 12/27/15 2207  PHART 7.414  PCO2ART 38.9  PO2ART 45.0*    Liver Enzymes  Recent Labs Lab 12/22/15 2123 12/27/15 1900  AST 30 40  ALT 16* 22  ALKPHOS 91 72  BILITOT 0.7 1.1  ALBUMIN 3.9 3.3*    Cardiac Enzymes No results for input(s): TROPONINI, PROBNP in the last 168 hours.  Glucose No results for input(s): GLUCAP  in the last 168 hours.  Imaging Dg Chest Portable 1 View  12/27/2015  CLINICAL DATA:  Shortness of breath, irregular heartbeat, congestion, hypertension, stroke, former smoker EXAM: PORTABLE CHEST 1 VIEW COMPARISON:  Portable exam 1918 hours compared to 12/23/2015 FINDINGS: Normal heart size with note of a coronary arterial stent. Atherosclerotic calcification aorta. Mediastinal contours and pulmonary vascularity normal. Bibasilar atelectasis. Lungs otherwise clear. No pleural effusion or pneumothorax. Bones demineralized. IMPRESSION: Bibasilar atelectasis. Electronically Signed   By: Ulyses Southward M.D.   On: 12/27/2015 19:25     STUDIES:  CXR 04/20 > bibasilar atx. CT head 04/20 > V/Q 04/21 > Echo 04/21 > LE duplex 04/21 >   CULTURES: None.  ANTIBIOTICS: None.  SIGNIFICANT EVENTS: 12/22/15 through 12/25/15 > admitted after mechanical fall at home where  he sustained closed left hip fx.  Decision made to treat non-operatively. 12/27/15 > admitted with acute hypoxemic respiratory failure, presumed due to PE.  Required intubation in ED.  LINES/TUBES: ETT 04/20 >  DISCUSSION: 82 y.o. M with recent closed left femur fx.  Discharged to SNF then re-admitted just 2 days later with acute hypoxemic respiratory failure.  History highly suspicious for PE therefore, pt started on empiric heparin gtt.  ASSESSMENT / PLAN:  PULMONARY A: Acute hypoxemic respiratory failure - due to presumed PE. Presumed PE - had recent closed left hip femur fx that is being treated conservatively, just discharged 2 days ago after. Hx emphysema. P:   Full vent support. Wean as able. VAP prevention measures. SBT in AM if able, cpap 5 ps 5, goal 1 hr Continue empiric heparin gtt. No role lysis at this point. Will need transition to oral agent if indeed this is PE, would use warfarin and avoid other NOAC's given his age and hx of falls. F/u VQ scan not sure will change management, echo, LE duplex. Budesonide  / Brovana in lieu of outpatient symbicort. Albuterol PRN. CXR in AM.  CARDIOVASCULAR A:  Hx HTN, HLD, CAD (reportely refused CABG in the past), severe aortic stenosis, AAA. Troponin leak - suspect due PE Presumed submassive PE P:  Monitor hemodynamics. Hydralazine PRN. Trend troponins. Hold outpatient amlodipine, imdur, lopressor, nitro. Continue outpatient ASA, plavix, atorvastatin. [place] back- mobil clot, place filter retrievable Hep drip, likely to oral agent in am    NEUROLOGIC A:   Acute metabolic encephalopathy. Hx falls, depression. P:   Sedation:  Propofol gtt / Fentanyl PRN. RASS goal: 0 to -1. Daily WUA. Now awake, NO ct needed  RENAL A:   CKD. P:   NS @ 75. BMP in AM.  GASTROINTESTINAL A:   GI prophylaxis. Nutrition. P:   SUP: Pantoprazole. NPO Start TF  HEMATOLOGIC A:   VTE Prophylaxis Presumed submassive PE P: Heparin gtt. CBC in AM  INFECTIOUS A:   No evidence of infection. P:   Monitor clinically.  ENDOCRINE A:   Hypothyroidism.   P:   Continue outpatient synthroid, change to IV formulation. Assess TSH.   Family updated: None.  Interdisciplinary Family Meeting v Palliative Care Meeting:  Due by: 01/02/16.  CC time: 35 minutes.   Rutherford Guys, [place] [facility] Pager: [phone]  or [phone] 12/27/2015, 11:05 PM   STAFF NOTE: I, Rory Percy, MD FACP have personally reviewed patient's available data, including medical history, events of note, physical examination and test results as part of my evaluation. I have discussed with resident/NP and other care providers such as pharmacist, RN and RRT. In addition, I personally evaluated patient and elicited key findings of: awakens to follow command snow, NON ct head needed, prop prn, c/w submassive PE, r/o Rv failure with  ( likely) , has Major comorbids that give him limited reserve - hence ended up on vent, I dont think VQ will  change management, hep to oral agent in am likely if no declines overnight, dopler back, mobile clot, get filter retrievable as likely with all his comorbids if has further PE will code and die, await echo for PA pressures and rv fxn, ABG reviewed, SBT planned , no extubation likely, need to discuss with dauighter code status and dni status??, start feeds The patient is critically ill with multiple organ systems failure and requires high complexity decision making for  assessment and support, frequent evaluation and titration of therapies, application of advanced monitoring technologies and extensive interpretation of multiple databases.   Critical Care Time devoted to patient care services described in this note is45 Minutes. This time reflects time of care of this signee: Rory Percy, MD FACP. This critical care time does not reflect procedure time, or teaching time or supervisory time of PA/NP/Med student/Med Resident etc but could involve care discussion time. Rest per NP/medical resident whose note is outlined above and that I agree with   Mcarthur Rossetti. Tyson Alias, MD, FACP Pgr: [phone] [place] [facility] 12/28/2015 11:15 AM

## 2015-12-28 ENCOUNTER — Inpatient Hospital Stay (HOSPITAL_COMMUNITY): Payer: Medicare Other

## 2015-12-28 DIAGNOSIS — R1010 Upper abdominal pain, unspecified: Secondary | ICD-10-CM

## 2015-12-28 DIAGNOSIS — G9340 Encephalopathy, unspecified: Secondary | ICD-10-CM

## 2015-12-28 DIAGNOSIS — J9601 Acute respiratory failure with hypoxia: Secondary | ICD-10-CM

## 2015-12-28 DIAGNOSIS — I714 Abdominal aortic aneurysm, without rupture: Secondary | ICD-10-CM

## 2015-12-28 LAB — TROPONIN I
Troponin I: 0.71 ng/mL (ref <0.031)
Troponin I: 0.78 ng/mL (ref <0.031)

## 2015-12-28 LAB — COMPREHENSIVE METABOLIC PANEL
ALT: 20 U/L (ref 17–63)
AST: 48 U/L — AB (ref 15–41)
Albumin: 3 g/dL — ABNORMAL LOW (ref 3.5–5.0)
Alkaline Phosphatase: 62 U/L (ref 38–126)
Anion gap: 12 (ref 5–15)
BILIRUBIN TOTAL: 1.8 mg/dL — AB (ref 0.3–1.2)
BUN: 30 mg/dL — AB (ref 6–20)
CALCIUM: 8.8 mg/dL — AB (ref 8.9–10.3)
CHLORIDE: 102 mmol/L (ref 101–111)
CO2: 22 mmol/L (ref 22–32)
CREATININE: 1.42 mg/dL — AB (ref 0.61–1.24)
GFR, EST AFRICAN AMERICAN: 52 mL/min — AB (ref >60)
GFR, EST NON AFRICAN AMERICAN: 45 mL/min — AB (ref >60)
Glucose, Bld: 118 mg/dL — ABNORMAL HIGH (ref 65–99)
Potassium: 4.2 mmol/L (ref 3.5–5.1)
Sodium: 136 mmol/L (ref 135–145)
TOTAL PROTEIN: 6.7 g/dL (ref 6.5–8.1)

## 2015-12-28 LAB — CBC
HEMATOCRIT: 34.4 % — AB (ref 39.0–52.0)
HEMOGLOBIN: 11.6 g/dL — AB (ref 13.0–17.0)
MCH: 30.8 pg (ref 26.0–34.0)
MCHC: 33.7 g/dL (ref 30.0–36.0)
MCV: 91.2 fL (ref 78.0–100.0)
Platelets: 187 10*3/uL (ref 150–400)
RBC: 3.77 MIL/uL — ABNORMAL LOW (ref 4.22–5.81)
RDW: 13.2 % (ref 11.5–15.5)
WBC: 10 10*3/uL (ref 4.0–10.5)

## 2015-12-28 LAB — GLUCOSE, CAPILLARY
GLUCOSE-CAPILLARY: 122 mg/dL — AB (ref 65–99)
Glucose-Capillary: 124 mg/dL — ABNORMAL HIGH (ref 65–99)
Glucose-Capillary: 112 mg/dL — ABNORMAL HIGH (ref 65–99)

## 2015-12-28 LAB — BRAIN NATRIURETIC PEPTIDE: B Natriuretic Peptide: 67.7 pg/mL (ref 0.0–100.0)

## 2015-12-28 LAB — TRIGLYCERIDES: Triglycerides: 65 mg/dL (ref <150)

## 2015-12-28 LAB — HEPARIN LEVEL (UNFRACTIONATED)
HEPARIN UNFRACTIONATED: 0.5 [IU]/mL (ref 0.30–0.70)
HEPARIN UNFRACTIONATED: 0.42 [IU]/mL (ref 0.30–0.70)

## 2015-12-28 LAB — POCT I-STAT 3, ART BLOOD GAS (G3+)
ACID-BASE EXCESS: 1 mmol/L (ref 0.0–2.0)
Bicarbonate: 25.9 mEq/L — ABNORMAL HIGH (ref 20.0–24.0)
O2 SAT: 100 %
PH ART: 7.393 (ref 7.350–7.450)
TCO2: 27 mmol/L (ref 0–100)
pCO2 arterial: 42.3 mmHg (ref 35.0–45.0)
pO2, Arterial: 323 mmHg — ABNORMAL HIGH (ref 80.0–100.0)

## 2015-12-28 LAB — PHOSPHORUS
PHOSPHORUS: 3.6 mg/dL (ref 2.5–4.6)
PHOSPHORUS: 3.4 mg/dL (ref 2.5–4.6)

## 2015-12-28 LAB — LIPID PANEL
Cholesterol: 122 mg/dL (ref 0–200)
HDL: 40 mg/dL — AB (ref >40)
LDL CALC: 66 mg/dL (ref 0–99)
TRIGLYCERIDES: 78 mg/dL (ref <150)
Total CHOL/HDL Ratio: 3.1 RATIO
VLDL: 16 mg/dL (ref 0–40)

## 2015-12-28 LAB — MAGNESIUM
Magnesium: 2.3 mg/dL (ref 1.7–2.4)
Magnesium: 2.1 mg/dL (ref 1.7–2.4)

## 2015-12-28 LAB — T4, FREE: Free T4: 0.79 ng/dL (ref 0.61–1.12)

## 2015-12-28 LAB — TSH: TSH: 21.99 u[IU]/mL — AB (ref 0.350–4.500)

## 2015-12-28 LAB — PROTIME-INR
INR: 1.38 (ref 0.00–1.49)
Prothrombin Time: 17 seconds — ABNORMAL HIGH (ref 11.6–15.2)

## 2015-12-28 LAB — LIPASE, BLOOD: Lipase: 22 U/L (ref 11–51)

## 2015-12-28 LAB — MRSA PCR SCREENING: MRSA BY PCR: NEGATIVE

## 2015-12-28 LAB — APTT: APTT: 141 s — AB (ref 24–37)

## 2015-12-28 MED ORDER — PROPOFOL 1000 MG/100ML IV EMUL
0.0000 ug/kg/min | INTRAVENOUS | Status: DC
  Administered 2015-12-28: 20 ug/kg/min via INTRAVENOUS
  Administered 2015-12-28 – 2015-12-29 (×2): 5 ug/kg/min via INTRAVENOUS
  Filled 2015-12-28 (×3): qty 100

## 2015-12-28 MED ORDER — LIDOCAINE HCL 1 % IJ SOLN
INTRAMUSCULAR | Status: AC
  Administered 2015-12-28: 14:00:00
  Filled 2015-12-28: qty 20

## 2015-12-28 MED ORDER — DOCUSATE SODIUM 50 MG/5ML PO LIQD
100.0000 mg | Freq: Two times a day (BID) | ORAL | Status: DC
  Administered 2015-12-28 – 2016-01-02 (×9): 100 mg via ORAL
  Filled 2015-12-28 (×12): qty 10

## 2015-12-28 MED ORDER — VITAL HIGH PROTEIN PO LIQD
1000.0000 mL | ORAL | Status: DC
  Administered 2015-12-28: 1000 mL

## 2015-12-28 MED ORDER — PRO-STAT SUGAR FREE PO LIQD
30.0000 mL | Freq: Two times a day (BID) | ORAL | Status: DC
  Filled 2015-12-28 (×2): qty 30

## 2015-12-28 MED ORDER — FENTANYL CITRATE (PF) 100 MCG/2ML IJ SOLN
INTRAMUSCULAR | Status: AC
  Filled 2015-12-28: qty 2

## 2015-12-28 MED ORDER — IOPAMIDOL (ISOVUE-300) INJECTION 61%
INTRAVENOUS | Status: AC
  Administered 2015-12-28: 14:00:00
  Filled 2015-12-28: qty 100

## 2015-12-28 MED ORDER — VITAL AF 1.2 CAL PO LIQD
1000.0000 mL | ORAL | Status: DC
  Administered 2015-12-28: 1000 mL

## 2015-12-28 NOTE — Sedation Documentation (Signed)
Patient is resting comfortably. 

## 2015-12-28 NOTE — Progress Notes (Signed)
CRITICAL VALUE ALERT  Critical value received:  Troponin 0.71  Date of notification:  12/28/2015  Time of notification:  03:55  Critical value read back:Yes.    Nurse who received alert:  Wilburn MylarEmily Burdo, RN  MD notified (1st page):  Earlene PlaterWallace, A.  Time of first page:  04:00  MD notified (2nd page):  Time of second page:  Responding MD:  Earlene PlaterWallace, A.  Time MD responded:  04:05

## 2015-12-28 NOTE — H&P (Deleted)
PULMONARY / CRITICAL CARE MEDICINE   Name: Maxwell Copeland MRN: [id number] DOB: 01/04/1932    ADMISSION DATE:  12/27/2015 CONSULTATION DATE:  12/27/15  REFERRING MD:  EDP  CHIEF COMPLAINT:  SOB  HISTORY OF PRESENT ILLNESS:  Pt is encephelopathic; therefore, this HPI is obtained from chart review. Maxwell Copeland is a 82 y.o. male with PMH as outlined below including recent left non-displaced intertrochanteric femur fx for which he was recently hospitalized 12/22/15 through 12/25/15.  Due to his extensive cardiac hx including CAD and severe aortic stenosis, he was deemed a high risk surgical candidate.  After discussions with family, decision was made to defer surgery and manage the fracture conservatively.  He was seen in consultation by Dr. [name] of orthopedics who instructed him to be NWB and to follow up with ortho as an outpatient 3 - 4 weeks post discharge.  Readmitted for hypoxia on 04/20 and empiric trx started for PE.  Awaiting confirmatory studies.   Allergies  Allergen Reactions  . Prednisone Palpitations    See 02/07/14 he feels this caused chest pain    No current facility-administered medications on file prior to encounter.   Current Outpatient Prescriptions on File Prior to Encounter  Medication Sig  . acetaminophen (TYLENOL) 325 MG tablet Take 2 tablets (650 mg total) by mouth every 4 (four) hours as needed for mild pain, fever or headache.  Marland Kitchen. amLODipine (NORVASC) 10 MG tablet Take 10 mg by mouth every morning.   Marland Kitchen. aspirin 325 MG tablet Take 325 mg by mouth daily.  Marland Kitchen. atorvastatin (LIPITOR) 80 MG tablet Take 80 mg by mouth every morning.  . baclofen (LIORESAL) 10 MG tablet Take 1 tablet (10 mg total) by mouth 3 (three) times daily as needed for muscle spasms.  . budesonide-formoterol (SYMBICORT) 160-4.5 MCG/ACT inhaler Inhale 2 puffs into the lungs 2 (two) times daily as needed (for shortness of breath). Reported on 09/13/2015  . clopidogrel (PLAVIX) 75 MG tablet Take  75 mg by mouth every morning.   . docusate sodium (COLACE) 100 MG capsule Take 1 capsule (100 mg total) by mouth 2 (two) times daily.  . isosorbide mononitrate (IMDUR) 30 MG 24 hr tablet Take 30 mg by mouth every morning.  Marland Kitchen. levothyroxine (SYNTHROID, LEVOTHROID) 112 MCG tablet Take 112 mcg by mouth daily before breakfast.   . metoprolol (LOPRESSOR) 50 MG tablet Take 0.5 tablets (25 mg total) by mouth 2 (two) times daily.  . niacin (SLO-NIACIN) 500 MG tablet Take 500 mg by mouth every morning.   . nitroGLYCERIN (NITROSTAT) 0.4 MG SL tablet Place 1 tablet (0.4 mg total) under the tongue every 5 (five) minutes as needed for chest pain.  Marland Kitchen. oxyCODONE (OXY IR/ROXICODONE) 5 MG immediate release tablet Take 1-2 tablets (5-10 mg total) by mouth every 4 (four) hours as needed for breakthrough pain ((for MODERATE breakthrough pain)).  . pantoprazole (PROTONIX) 40 MG tablet Take 1 tablet (40 mg total) by mouth daily.  . vitamin E 400 UNIT capsule Take 800 Units by mouth daily.  Marland Kitchen. morphine (MS CONTIN) 30 MG 12 hr tablet Take 1 tablet (30 mg total) by mouth every 12 (twelve) hours.    SUBJECTIVE:  pt is sedate and intubated on vent.  VITAL SIGNS: BP 116/58 mmHg  Pulse 109  Temp(Src) 98.3 F (36.8 C) (Oral)  Resp 15  Ht 5\' 7"  (1.702 m)  Wt 144 lb 13.5 oz (65.7 kg)  BMI 22.68 kg/m2  SpO2 99%  HEMODYNAMICS:  VENTILATOR SETTINGS: Vent Mode:  [-] PRVC FiO2 (%):  [60 %-100 %] 60 % Set Rate:  [14 bmp] 14 bmp Vt Set:  [510 mL] 510 mL PEEP:  [5 cmH20] 5 cmH20 Plateau Pressure:  [14 cmH20-16 cmH20] 15 cmH20  INTAKE / OUTPUT: I/O last 3 completed shifts: In: 1217.4 [I.V.:1177.4; NG/GT:40] Out: 975 [Urine:975]   PHYSICAL EXAMINATION: General: Elderly male, chronically ill appearing, in NAD. Neuro:  Sedated, non-responsive. HEENT: [place]/AT Cardiovascular: RRR, 4/6 SEM  Lungs: on vent, CTA, no w/r/r Abdomen: soft, NT/ND Ext:  No LE edema, left leg in brace  Skin: Intact, warm, no  rashes.  LABS:  BMET  Recent Labs Lab 12/24/15 0426 12/27/15 1900 12/28/15 0301  NA 139 137 136  K 4.0 4.9 4.2  CL 105 101 102  CO2 BUN 16 33* 30*  CREATININE 1.29* 1.51* 1.42*  GLUCOSE 99 124* 118*    Electrolytes  Recent Labs Lab 12/24/15 0426 12/27/15 1900 12/28/15 0301  CALCIUM 9.1 9.8 8.8*  MG  --   --  2.3  PHOS  --   --  3.6    CBC  Recent Labs Lab 12/24/15 0426 12/27/15 1900 12/28/15 0301  WBC 8.7 9.9 10.0  HGB 11.8* 12.8* 11.6*  HCT 35.9* 37.8* 34.4*  PLT 132* 177 187    Coag's  Recent Labs Lab 12/23/15 1030 12/28/15 0040  APTT  --  141*  INR 1.34 1.38    Sepsis Markers  Recent Labs Lab 12/27/15 1921  LATICACIDVEN 1.48    ABG  Recent Labs Lab 12/27/15 2207 12/28/15 0053  PHART 7.414 7.393  PCO2ART 38.9 42.3  PO2ART 45.0* 323.0*    Liver Enzymes  Recent Labs Lab 12/22/15 2123 12/27/15 1900 12/28/15 0301  AST 30 40 48*  ALT 16* 22 20  ALKPHOS 91 72 62  BILITOT 0.7 1.1 1.8*  ALBUMIN 3.9 3.3* 3.0*    Cardiac Enzymes  Recent Labs Lab 12/28/15 0301  TROPONINI 0.71*    Glucose  Recent Labs Lab 12/28/15 0103  GLUCAP 122*    Imaging Dg Chest Portable 1 View  12/27/2015  CLINICAL DATA:  Post intubation. EXAM: PORTABLE CHEST 1 VIEW COMPARISON:  Earlier this day at 1918 hour FINDINGS: Endotracheal tube 4.9 cm from the carina. There is increased bibasilar atelectasis from prior, left greater than right. Heart is normal in size. No pulmonary edema large pleural effusion or pneumothorax. IMPRESSION: Endotracheal tube 4.9 cm from the carina. Increasing bibasilar atelectasis. Electronically Signed   By: Rubye Oaks M.D.   On: 12/27/2015 23:29   Dg Chest Portable 1 View  12/27/2015  CLINICAL DATA:  Shortness of breath, irregular heartbeat, congestion, hypertension, stroke, former smoker EXAM: PORTABLE CHEST 1 VIEW COMPARISON:  Portable exam 1918 hours compared to 12/23/2015 FINDINGS: Normal heart size  with note of a coronary arterial stent. Atherosclerotic calcification aorta. Mediastinal contours and pulmonary vascularity normal. Bibasilar atelectasis. Lungs otherwise clear. No pleural effusion or pneumothorax. Bones demineralized. IMPRESSION: Bibasilar atelectasis. Electronically Signed   By: Ulyses Southward M.D.   On: 12/27/2015 19:25     STUDIES:  CXR 04/20 > bibasilar atx. CT head 04/20 > V/Q 04/21 > Echo 04/21 > LE duplex 04/21 >   CULTURES: None.  ANTIBIOTICS: None.  SIGNIFICANT EVENTS: 12/22/15 through 12/25/15 > admitted after mechanical fall at home where he sustained closed left hip fx.  Decision made to treat non-operatively. 12/27/15 > admitted with acute hypoxemic respiratory failure, presumed due to PE.  Required  intubation in ED.  LINES/TUBES: ETT 04/20 >  DISCUSSION: 82 y.o. M with recent closed left femur fx.  Discharged to SNF then re-admitted just 2 days later with acute hypoxemic respiratory failure.  History highly suspicious for PE therefore, pt started on empiric heparin gtt.  ASSESSMENT / PLAN:  PULMONARY A: Acute hypoxemic respiratory failure - due to presumed PE. Presumed PE - had recent closed left hip femur fx that is being treated conservatively, just discharged 2 days ago after. Hx emphysema. P:   Full vent support. Wean as able. VAP prevention measures. SBT in AM if able. Continue empiric heparin gtt. No role lysis at this point. Will need transition to oral agent if indeed this is PE. VQ scan, echo, LE duplex - pending Budesonide / Brovana in lieu of outpatient symbicort. Albuterol PRN. CXR in AM.  CARDIOVASCULAR A:  Hx HTN, HLD, CAD (reportely refused CABG in the past), severe aortic stenosis, AAA. Troponin leak, possibly RH strain due to PE P:  Monitor hemodynamics. Hydralazine PRN. Trend troponins. Continue to hold outpatient amlodipine, imdur, lopressor, nitro. Continue outpatient ASA, plavix, atorvastatin.  Would d/c plavix  and continue ASA + oral A/C if long-term A/C is needed.  NEUROLOGIC A:    Acute metabolic encephalopathy. Hx falls, depression. P:   Sedation:  Propofol gtt / Fentanyl PRN. RASS goal: 0 to -1. Daily WUA.  RENAL A:   CKD. P:   Continue NS @ 75 for now, monitor UOP Monitor BMP  GASTROINTESTINAL A:   GI prophylaxis. Nutrition. P:   SUP: Pantoprazole. NPO, start TF tomorrow if still NPO  HEMATOLOGIC A:   VTE Prophylaxis. P: Heparin gtt for presumed PE CBC daily, heparin levels  INFECTIOUS A:   No evidence of infection. P:   Monitor clinically.  ENDOCRINE A:   Hypothyroidism.  TSH 21.9 this admission, likely euthyroid sick syndrome. P:   Continue outpatient synthroid IV for now Check free T4 and T3.   Family updated: None  Interdisciplinary Family Meeting v Palliative Care Meeting:  Due by: 01/02/16.  Evelena Peat DO IMTS PGY3 12/28/2015, 7:15 AM

## 2015-12-28 NOTE — Procedures (Signed)
IVCgram: negative IVC filter placed, infrarenal, retrievable No complication No blood loss. See complete dictation in [facility] PACS  

## 2015-12-28 NOTE — Progress Notes (Signed)
Initial Nutrition Assessment  DOCUMENTATION CODES:   Severe malnutrition in context of chronic illness  INTERVENTION:   Initiate Vital AF 1.2 @ 25 ml/hr via OG tube and increase by 10 ml every 8 hours to goal rate of 55 ml/hr.   Once at goal rate can then institute PEPuP.   Monitor magnesium, potassium, and phosphorus daily for at least 3 days, MD to replete as needed, as pt is at risk for refeeding syndrome given severe malnutrition.  NUTRITION DIAGNOSIS:   Malnutrition related to chronic illness (severe cardiac disease) as evidenced by severe depletion of muscle mass, severe depletion of body fat.  GOAL:   Patient will meet greater than or equal to 90% of their needs  MONITOR:   Vent status, Weight trends, TF tolerance, I & O's, Labs  REASON FOR ASSESSMENT:   Consult, Ventilator Enteral/tube feeding initiation and management  ASSESSMENT:   Pt with PMH including recent left non-displaced intertrochanteric femur fx for which he was recently hospitalized 12/22/15 through 12/25/15. Due to his extensive cardiac hx including CAD and severe aortic stenosis, he was deemed a high risk surgical candidate. After discussions with family, decision was made to defer surgery and manage the fracture conservatively. Admitted 4/20 wth hypoxia and PE.   Patient is currently intubated on ventilator support MV: 9.9 L/min Temp (24hrs), Avg:98.6 F (37 C), Min:97.9 F (36.6 C), Max:99.8 F (37.7 C)  Propofol: 2 ml/hr provides 52 kcal per day from lipid Nutrition-Focused physical exam completed. Findings are severe  fat depletion, severe muscle depletion, and no edema.  Medications reviewed and include: colace Labs reviewed   Diet Order:  Diet NPO time specified  Skin:  Reviewed, no issues  Last BM:  unknown  Height:   Ht Readings from Last 1 Encounters:  12/27/15 5\' 7"  (1.702 m)    Weight:   Wt Readings from Last 1 Encounters:  12/28/15 144 lb 13.5 oz (65.7 kg)     Ideal Body Weight:  67.2 kg  BMI:  Body mass index is 22.68 kg/(m^2).  Estimated Nutritional Needs:   Kcal:  1664  Protein:  90-110 grams  Fluid:  > 1.7 L/day  EDUCATION NEEDS:   No education needs identified at this time  Kendell BaneHeather Beretta Ginsberg RD, LDN, CNSC [phone] Pager [phone] After Hours Pager

## 2015-12-28 NOTE — Progress Notes (Signed)
PULMONARY / CRITICAL CARE MEDICINE Progress Note   Name: Maxwell Copeland MRN: [id number] DOB: Oct 24, 1931    ADMISSION DATE:  12/27/2015 CONSULTATION DATE:  12/27/15  REFERRING MD:  EDP  CHIEF COMPLAINT:  SOB  HISTORY OF PRESENT ILLNESS:  Pt is encephelopathic; therefore, this HPI is obtained from chart review. GEROD CALIGIURI is a 82 y.o. male with PMH as outlined below including recent left non-displaced intertrochanteric femur fx for which he was recently hospitalized 12/22/15 through 12/25/15.  Due to his extensive cardiac hx including CAD and severe aortic stenosis, he was deemed a high risk surgical candidate.  After discussions with family, decision was made to defer surgery and manage the fracture conservatively.  He was seen in consultation by Dr. Charlann Boxer of orthopedics who instructed him to be NWB and to follow up with ortho as an outpatient 3 - 4 weeks post discharge.  Readmitted for hypoxia on 04/20 and empiric trx started for PE.  Awaiting confirmatory studies.   Allergies  Allergen Reactions  . Prednisone Palpitations    See 02/07/14 he feels this caused chest pain    No current facility-administered medications on file prior to encounter.   Current Outpatient Prescriptions on File Prior to Encounter  Medication Sig  . acetaminophen (TYLENOL) 325 MG tablet Take 2 tablets (650 mg total) by mouth every 4 (four) hours as needed for mild pain, fever or headache.  Marland Kitchen amLODipine (NORVASC) 10 MG tablet Take 10 mg by mouth every morning.   Marland Kitchen aspirin 325 MG tablet Take 325 mg by mouth daily.  Marland Kitchen atorvastatin (LIPITOR) 80 MG tablet Take 80 mg by mouth every morning.  . baclofen (LIORESAL) 10 MG tablet Take 1 tablet (10 mg total) by mouth 3 (three) times daily as needed for muscle spasms.  . budesonide-formoterol (SYMBICORT) 160-4.5 MCG/ACT inhaler Inhale 2 puffs into the lungs 2 (two) times daily as needed (for shortness of breath). Reported on 09/13/2015  . clopidogrel (PLAVIX) 75  MG tablet Take 75 mg by mouth every morning.   . docusate sodium (COLACE) 100 MG capsule Take 1 capsule (100 mg total) by mouth 2 (two) times daily.  . isosorbide mononitrate (IMDUR) 30 MG 24 hr tablet Take 30 mg by mouth every morning.  Marland Kitchen levothyroxine (SYNTHROID, LEVOTHROID) 112 MCG tablet Take 112 mcg by mouth daily before breakfast.   . metoprolol (LOPRESSOR) 50 MG tablet Take 0.5 tablets (25 mg total) by mouth 2 (two) times daily.  . niacin (SLO-NIACIN) 500 MG tablet Take 500 mg by mouth every morning.   . nitroGLYCERIN (NITROSTAT) 0.4 MG SL tablet Place 1 tablet (0.4 mg total) under the tongue every 5 (five) minutes as needed for chest pain.  Marland Kitchen oxyCODONE (OXY IR/ROXICODONE) 5 MG immediate release tablet Take 1-2 tablets (5-10 mg total) by mouth every 4 (four) hours as needed for breakthrough pain ((for MODERATE breakthrough pain)).  . pantoprazole (PROTONIX) 40 MG tablet Take 1 tablet (40 mg total) by mouth daily.  . vitamin E 400 UNIT capsule Take 800 Units by mouth daily.  Marland Kitchen morphine (MS CONTIN) 30 MG 12 hr tablet Take 1 tablet (30 mg total) by mouth every 12 (twelve) hours.    SUBJECTIVE:  pt is sedate and intubated on vent.  VITAL SIGNS: BP 116/58 mmHg  Pulse 109  Temp(Src) 98.3 F (36.8 C) (Oral)  Resp 15  Ht  (1.702 m)  Wt 144 lb 13.5 oz (65.7 kg)  BMI 22.68 kg/m2  SpO2 99%  HEMODYNAMICS:  VENTILATOR SETTINGS: Vent Mode:  [-] PRVC FiO2 (%):  [60 %-100 %] 60 % Set Rate:  [14 bmp] 14 bmp Vt Set:  [510 mL] 510 mL PEEP:  [5 cmH20] 5 cmH20 Plateau Pressure:  [14 cmH20-16 cmH20] 15 cmH20  INTAKE / OUTPUT: I/O last 3 completed shifts: In: 1217.4 [I.V.:1177.4; NG/GT:40] Out: 975 [Urine:975]   PHYSICAL EXAMINATION: General: Elderly male, chronically ill appearing, in NAD. Neuro:  Sedated, non-responsive. HEENT: [place]/AT Cardiovascular: RRR, 4/6 SEM  Lungs: on vent, CTA, no w/r/r Abdomen: soft, NT/ND Ext:  No LE edema, left leg in brace  Skin: Intact, warm,  no rashes.  LABS:  BMET  Recent Labs Lab 12/24/15 0426 12/27/15 1900 12/28/15 0301  NA 139 137 136  K 4.0 4.9 4.2  CL 105 101 102  CO2 BUN 16 33* 30*  CREATININE 1.29* 1.51* 1.42*  GLUCOSE 99 124* 118*    Electrolytes  Recent Labs Lab 12/24/15 0426 12/27/15 1900 12/28/15 0301  CALCIUM 9.1 9.8 8.8*  MG  --   --  2.3  PHOS  --   --  3.6    CBC  Recent Labs Lab 12/24/15 0426 12/27/15 1900 12/28/15 0301  WBC 8.7 9.9 10.0  HGB 11.8* 12.8* 11.6*  HCT 35.9* 37.8* 34.4*  PLT 132* 177 187    Coag's  Recent Labs Lab 12/23/15 1030 12/28/15 0040  APTT  --  141*  INR 1.34 1.38    Sepsis Markers  Recent Labs Lab 12/27/15 1921  LATICACIDVEN 1.48    ABG  Recent Labs Lab 12/27/15 2207 12/28/15 0053  PHART 7.414 7.393  PCO2ART 38.9 42.3  PO2ART 45.0* 323.0*    Liver Enzymes  Recent Labs Lab 12/22/15 2123 12/27/15 1900 12/28/15 0301  AST 30 40 48*  ALT 16* 22 20  ALKPHOS 91 72 62  BILITOT 0.7 1.1 1.8*  ALBUMIN 3.9 3.3* 3.0*    Cardiac Enzymes  Recent Labs Lab 12/28/15 0301  TROPONINI 0.71*    Glucose  Recent Labs Lab 12/28/15 0103  GLUCAP 122*    Imaging Dg Chest Portable 1 View  12/27/2015  CLINICAL DATA:  Post intubation. EXAM: PORTABLE CHEST 1 VIEW COMPARISON:  Earlier this day at 1918 hour FINDINGS: Endotracheal tube 4.9 cm from the carina. There is increased bibasilar atelectasis from prior, left greater than right. Heart is normal in size. No pulmonary edema large pleural effusion or pneumothorax. IMPRESSION: Endotracheal tube 4.9 cm from the carina. Increasing bibasilar atelectasis. Electronically Signed   By: Rubye Oaks M.D.   On: 12/27/2015 23:29   Dg Chest Portable 1 View  12/27/2015  CLINICAL DATA:  Shortness of breath, irregular heartbeat, congestion, hypertension, stroke, former smoker EXAM: PORTABLE CHEST 1 VIEW COMPARISON:  Portable exam 1918 hours compared to 12/23/2015 FINDINGS: Normal heart  size with note of a coronary arterial stent. Atherosclerotic calcification aorta. Mediastinal contours and pulmonary vascularity normal. Bibasilar atelectasis. Lungs otherwise clear. No pleural effusion or pneumothorax. Bones demineralized. IMPRESSION: Bibasilar atelectasis. Electronically Signed   By: Ulyses Southward M.D.   On: 12/27/2015 19:25     STUDIES:  CXR 04/20 > bibasilar atx. CT head 04/20 > V/Q 04/21 > Echo 04/21 > LE duplex 04/21 >   CULTURES: None.  ANTIBIOTICS: None.  SIGNIFICANT EVENTS: 12/22/15 through 12/25/15 > admitted after mechanical fall at home where he sustained closed left hip fx.  Decision made to treat non-operatively. 12/27/15 > admitted with acute hypoxemic respiratory failure, presumed due to PE.  Required  intubation in ED.  LINES/TUBES: ETT 04/20 >  DISCUSSION: 82 y.o. M with recent closed left femur fx.  Discharged to SNF then re-admitted just 2 days later with acute hypoxemic respiratory failure.  History highly suspicious for PE therefore, pt started on empiric heparin gtt.  ASSESSMENT / PLAN:  PULMONARY A: Acute hypoxemic respiratory failure - due to presumed PE. Presumed PE - had recent closed left hip femur fx that is being treated conservatively, just discharged 2 days ago after. Hx emphysema. P:   Full vent support. Wean as able. VAP prevention measures. SBT in AM if able. Continue empiric heparin gtt. No role lysis at this point. Will need transition to oral agent if indeed this is PE. VQ scan, echo, LE duplex - pending Budesonide / Brovana in lieu of outpatient symbicort. Albuterol PRN. CXR in AM.  CARDIOVASCULAR A:  Hx HTN, HLD, CAD (reportely refused CABG in the past), severe aortic stenosis, AAA. Troponin leak, possibly RH strain due to PE P:  Monitor hemodynamics. Hydralazine PRN. Trend troponins. Continue to hold outpatient amlodipine, imdur, lopressor, nitro. Continue outpatient ASA, plavix, atorvastatin.  Would d/c  plavix and continue ASA + oral A/C if long-term A/C is needed.  NEUROLOGIC A:    Acute metabolic encephalopathy. Hx falls, depression. P:   Sedation:  Propofol gtt / Fentanyl PRN. RASS goal: 0 to -1. Daily WUA.  RENAL A:   CKD. P:   Continue NS @ 75 for now, monitor UOP Monitor BMP  GASTROINTESTINAL A:   GI prophylaxis. Nutrition. P:   SUP: Pantoprazole. NPO, start TF tomorrow if still NPO  HEMATOLOGIC A:   VTE Prophylaxis. P: Heparin gtt for presumed PE CBC daily, heparin levels  INFECTIOUS A:   No evidence of infection. P:   Monitor clinically.  ENDOCRINE A:   Hypothyroidism.  TSH 21.9 this admission, likely euthyroid sick syndrome. P:   Continue outpatient synthroid IV for now Check free T4 and T3.   Family updated: daughter at bedside  Interdisciplinary Family Meeting v Palliative Care Meeting:  Due by: 01/02/16.  Evelena PeatWilson, Dorris Vangorder DO IMTS PGY3 12/28/2015, 7:15 AM

## 2015-12-28 NOTE — Consult Note (Signed)
Chief Complaint: Patient was seen in consultation today for retrievable inferior vena cava filter placement Chief Complaint  Patient presents with  . Altered Mental Status   at the request of Dr Tyson Alias  Referring Physician(s): Dr Rory Percy  Supervising Physician: Oley Balm  History of Present Illness: Maxwell Copeland is a 82 y.o. male   Recent left femur fx - (fall at home) Not surgical candidate secondary CAD/Ao stenosis Hospitalized 4/15-4/18/17 Discharged on ASA 325 and Plavix 75 mg daily and Non weight bearing x 4 weeks Returned to ED 4/20 with SOB; hypoxia Did not do CTA secondary renal fxn (Cr 1.42) US doppler 4/21: Preliminary report: Right: No evidence of DVT, superficial thrombosis, or Baker's cyst. Left - Acute mobile DVT noted in the distal common femoral and coursing to the mid femoral veins.. No evidence of superficial thrombosis. No Baker's cyst. Now on Heparin drip  Request for retrievable inferior vena cava filter placement per Dr Marion Downer could not withstand any further clot burden if mobile clot were to move to lung Reviewed case with Dr Jodell Cipro procedure   Past Medical History  Diagnosis Date  . Coronary artery disease     LAD w/ Ca++, 100% RCA, in-astent stenosis Cx, high grade lesion Ramus  . Hypercholesteremia   . Dyspnea   . Hypothyroid   . CVA (cerebral infarction)   . Arthritis     hips, will give out  . Varicella   . Depression     never took any medication.  . Emphysema of lung (HCC)     smoking related.  . Hypertension   . Stroke [facility])     left hemiparesis-100% recovery  . AAA (abdominal aortic aneurysm) (HCC)     small  . Severe aortic stenosis 2012    Past Surgical History  Procedure Laterality Date  . Coronary angioplasty with stent placement      Several stents, last in 2004    Allergies: Prednisone  Medications: Prior to Admission medications   Medication Sig Start Date End  Date Taking? Authorizing Provider  acetaminophen (TYLENOL) 325 MG tablet Take 2 tablets (650 mg total) by mouth every 4 (four) hours as needed for mild pain, fever or headache. 03/21/14  Yes Eda Paschal Kilroy, PA-C  amLODipine (NORVASC) 10 MG tablet Take 10 mg by mouth every morning.    Yes Historical Provider, MD  aspirin 325 MG tablet Take 325 mg by mouth daily.   Yes Historical Provider, MD  atorvastatin (LIPITOR) 80 MG tablet Take 80 mg by mouth every morning.   Yes Historical Provider, MD  baclofen (LIORESAL) 10 MG tablet Take 1 tablet (10 mg total) by mouth 3 (three) times daily as needed for muscle spasms. 12/25/15  Yes Leroy Sea, MD  budesonide-formoterol (SYMBICORT) 160-4.5 MCG/ACT inhaler Inhale 2 puffs into the lungs 2 (two) times daily as needed (for shortness of breath). Reported on 09/13/2015 09/13/15  Yes Renee A Kuneff, DO  clopidogrel (PLAVIX) 75 MG tablet Take 75 mg by mouth every morning.    Yes Historical Provider, MD  docusate sodium (COLACE) 100 MG capsule Take 1 capsule (100 mg total) by mouth 2 (two) times daily. 12/25/15  Yes Leroy Sea, MD  isosorbide mononitrate (IMDUR) 30 MG 24 hr tablet Take 30 mg by mouth every morning.   Yes Historical Provider, MD  levothyroxine (SYNTHROID, LEVOTHROID) 112 MCG tablet Take 112 mcg by mouth daily before breakfast.    Yes Historical Provider, MD  metoprolol (LOPRESSOR) 50 MG tablet Take 0.5 tablets (25 mg total) by mouth 2 (two) times daily. 03/21/14  Yes Luke K Kilroy, PA-C  niacin (SLO-NIACIN) 500 MG tablet Take 500 mg by mouth every morning.    Yes Historical Provider, MD  nitroGLYCERIN (NITROSTAT) 0.4 MG SL tablet Place 1 tablet (0.4 mg total) under the tongue every 5 (five) minutes as needed for chest pain. 04/13/14  Yes Pecola LawlessWilliam F Hopper, MD  oxyCODONE (OXY IR/ROXICODONE) 5 MG immediate release tablet Take 1-2 tablets (5-10 mg total) by mouth every 4 (four) hours as needed for breakthrough pain ((for MODERATE breakthrough pain)).  12/25/15  Yes Leroy SeaPrashant K Singh, MD  pantoprazole (PROTONIX) 40 MG tablet Take 1 tablet (40 mg total) by mouth daily. 12/25/15  Yes Leroy SeaPrashant K Singh, MD  vitamin E 400 UNIT capsule Take 800 Units by mouth daily.   Yes Historical Provider, MD  morphine (MS CONTIN) 30 MG 12 hr tablet Take 1 tablet (30 mg total) by mouth every 12 (twelve) hours. 12/25/15   Leroy SeaPrashant K Singh, MD     Family History  Problem Relation Age of Onset  . Coronary artery disease    . Hyperlipidemia Mother   . Heart disease Mother     had artifical valve  . Heart disease Brother   . Hyperlipidemia Brother   . COPD Brother   . Heart disease Brother     CABG, valve replacement  . Alcohol abuse Brother   . Heart attack Mother   . Hypertension Mother     Social History   Social History  . Marital Status: Widowed    Spouse Name: N/A  . Number of Children: N/A  . Years of Education: N/A   Occupational History  . retired    Social History Main Topics  . Smoking status: Former Smoker    Quit date: 09/09/1987  . Smokeless tobacco: Current User  . Alcohol Use: No  . Drug Use: No  . Sexual Activity: Not Currently   Other Topics Concern  . None   Social History Narrative   [place] since 1960's. Married 47 years - widowed. 2 dtrs. Retired. Lives alone and independently but his dtr is close by.     Review of Systems: A 12 point ROS discussed and pertinent positives are indicated in the HPI above.  All other systems are negative.  Review of Systems  Constitutional: Positive for activity change. Negative for fever.  Respiratory:       Vent     Vital Signs: BP 107/67 mmHg  Pulse 99  Temp(Src) 99.8 F (37.7 C) (Axillary)  Resp 16  Ht 5\' 7"  (1.702 m)  Wt 144 lb 13.5 oz (65.7 kg)  BMI 22.68 kg/m2  SpO2 99%  Physical Exam  Cardiovascular: Normal rate and regular rhythm.   Pulmonary/Chest:  vent  Abdominal: Bowel sounds are normal.  Neurological:  No response  Skin: Skin is  warm and dry.  Psychiatric:  Consented daughter at bedside  Nursing note and vitals reviewed.   Mallampati Score:  MD Evaluation Airway: Other (comments) Airway comments: vent Heart: WNL Abdomen: WNL Chest/ Lungs: Other (comments) Chest/ lungs comments: vent ASA  Classification: 3 Mallampati/Airway Score: Three  Imaging: Dg Chest Port 1 View  12/28/2015  CLINICAL DATA:  Hypoxia EXAM: PORTABLE CHEST 1 VIEW COMPARISON:  December 27, 2015 FINDINGS: Endotracheal tube tip is 4.6 cm above the carina. Nasogastric tube tip and side-port in the stomach. No pneumothorax. There is  atelectasis in the left base. Lungs elsewhere clear. Heart size and pulmonary vascularity are normal. No adenopathy. There is atherosclerotic calcification in aorta. No bone lesions. IMPRESSION: Tube positions as described without pneumothorax. Left base region atelectasis. Lungs elsewhere clear. Stable cardiac silhouette. Electronically Signed   By: Bretta Bang III M.D.   On: 12/28/2015 07:27   Dg Chest Portable 1 View  12/27/2015  CLINICAL DATA:  Post intubation. EXAM: PORTABLE CHEST 1 VIEW COMPARISON:  Earlier this day at 1918 hour FINDINGS: Endotracheal tube 4.9 cm from the carina. There is increased bibasilar atelectasis from prior, left greater than right. Heart is normal in size. No pulmonary edema large pleural effusion or pneumothorax. IMPRESSION: Endotracheal tube 4.9 cm from the carina. Increasing bibasilar atelectasis. Electronically Signed   By: Rubye Oaks M.D.   On: 12/27/2015 23:29   Dg Chest Portable 1 View  12/27/2015  CLINICAL DATA:  Shortness of breath, irregular heartbeat, congestion, hypertension, stroke, former smoker EXAM: PORTABLE CHEST 1 VIEW COMPARISON:  Portable exam 1918 hours compared to 12/23/2015 FINDINGS: Normal heart size with note of a coronary arterial stent. Atherosclerotic calcification aorta. Mediastinal contours and pulmonary vascularity normal. Bibasilar atelectasis. Lungs  otherwise clear. No pleural effusion or pneumothorax. Bones demineralized. IMPRESSION: Bibasilar atelectasis. Electronically Signed   By: Ulyses Southward M.D.   On: 12/27/2015 19:25   Dg Chest Port 1 View  12/23/2015  CLINICAL DATA:  Pre op for right hip Fx surgery Hx of HTN, CVA, coronary artery disease, emphysema of lung, AAA EXAM: PORTABLE CHEST 1 VIEW COMPARISON:  09/13/2015 FINDINGS: Patient rotated to the left. Midline trachea. Cardiomegaly accentuated by AP portable technique. Atherosclerosis in the transverse aorta. No pleural effusion or pneumothorax. Left infrahilar airspace disease. Clear right lung. IMPRESSION: Oblique portable radiograph demonstrating left infrahilar airspace disease. This could represent mild scarring, accentuated by low lung volumes and obliquity. If there are cardiopulmonary symptoms, recommend preop PA and lateral radiographs. If not, this could be re-evaluated with nonemergent outpatient PA and lateral films. Cardiomegaly without congestive failure. These results will be called to the ordering clinician or representative by the Radiologist Assistant, and communication documented in the PACS or zVision Dashboard. Electronically Signed   By: Jeronimo Greaves M.D.   On: 12/23/2015 10:18   Dg Hip Unilat With Pelvis 2-3 Views Left  12/22/2015  CLINICAL DATA:  Fall with left hip pain EXAM: DG HIP (WITH OR WITHOUT PELVIS) 2-3V LEFT COMPARISON:  None. FINDINGS: Comminuted nondisplaced intertrochanteric left proximal femur fracture. No additional fracture. No dislocation or appreciable arthropathy in the left hip. Diffuse osteopenia. Degenerative changes in the visualized lower lumbar spine. Vascular calcifications throughout the soft tissues. Prominent stool throughout the visualized colon. IMPRESSION: Comminuted nondisplaced intertrochanteric left proximal femur fracture. Diffuse osteopenia. Electronically Signed   By: Delbert Phenix M.D.   On: 12/22/2015 22:00    Labs:  CBC:  Recent  Labs  12/23/15 0259 12/24/15 0426 12/27/15 1900 12/28/15 0301  WBC 15.0* 8.7 9.9 10.0  HGB 11.8* 11.8* 12.8* 11.6*  HCT 35.4* 35.9* 37.8* 34.4*  PLT 138* 132* 177 187    COAGS:  Recent Labs  12/23/15 1030 12/28/15 0040  INR 1.34 1.38  APTT  --  141*    BMP:  Recent Labs  12/23/15 0259 12/24/15 0426 12/27/15 1900 12/28/15 0301  NA 141 139 137 136  K 3.9 4.0 4.9 4.2  CL 107 105 101 102  CO2 GLUCOSE 124* 99 124* 118*  BUN 15  16 33* 30*  CALCIUM 9.2 9.1 9.8 8.8*  CREATININE 1.10 1.29* 1.51* 1.42*  GFRNONAA >60 50* 42* 45*  GFRAA >60 58* 48* 52*    LIVER FUNCTION TESTS:  Recent Labs  12/22/15 2123 12/27/15 1900 12/28/15 0301  BILITOT 0.7 1.1 1.8*  AST 30 40 48*  ALT 16* 22 20  ALKPHOS 91 72 62  PROT 7.5 7.8 6.7  ALBUMIN 3.9 3.3* 3.0*    TUMOR MARKERS: No results for input(s): AFPTM, CEA, CA199, CHROMGRNA in the last 8760 hours.  Assessment and Plan:  Lt femur fx Not surgical candidate NWB and ASA/Plavix x 4 weeks Back to hospital within 2 days with hypoxia Now LLE Mobile DVT---on heparin Scheduled for retrievable inferior vena cava filter placement Risks and Benefits discussed with the patient including, but not limited to bleeding, infection, contrast induced renal failure, filter fracture or migration which can lead to emergency surgery or even death, strut penetration with damage or irritation to adjacent structures and caval thrombosis. All of the patient's questions were answered, patient is agreeable to proceed. Consent signed and in chart.   Thank you for this interesting consult.  I greatly enjoyed meeting EDWAR COE and look forward to participating in their care.  A copy of this report was sent to the requesting provider on this date.  Electronically Signed: Ralene Muskrat A 12/28/2015, 12:53 PM   I spent a total of 40 Minutes    in face to face in clinical consultation, greater than 50% of which was  counseling/coordinating care for retrievable IVC filter placement

## 2015-12-28 NOTE — Progress Notes (Signed)
ANTICOAGULATION CONSULT NOTE - Follow-up Consult  Pharmacy Consult for Heparin Indication: pulmonary embolus (rule out)  Allergies  Allergen Reactions  . Prednisone Palpitations    See 02/07/14 he feels this caused chest pain    Patient Measurements: Height: 5\' 7"  (170.2 cm) Weight: 144 lb 13.5 oz (65.7 kg) IBW/kg (Calculated) : 66.1   Vital Signs: Temp: 99.8 F (37.7 C) (04/21 0726) Temp Source: Axillary (04/21 0726) BP: 85/59 mmHg (04/21 1100) Pulse Rate: 77 (04/21 1100)  Labs:  Recent Labs  12/27/15 1900 12/28/15 0040 12/28/15 0300 12/28/15 0301 12/28/15 1003  HGB 12.8*  --   --  11.6*  --   HCT 37.8*  --   --  34.4*  --   PLT 177  --   --  187  --   APTT  --  141*  --   --   --   LABPROT  --  17.0*  --   --   --   INR  --  1.38  --   --   --   HEPARINUNFRC  --   --  0.50  --  0.42  CREATININE 1.51*  --   --  1.42*  --   TROPONINI  --   --   --  0.71* 0.78*    Estimated Creatinine Clearance: 37.9 mL/min (by C-G formula based on Cr of 1.42).   Assessment: 82 year old male s/p recent hospitalization for left hip fracture returns with low oxygen saturations and confusion.  Pt on heparin for possible pulmonary embolus. Heparin level therapeutic on 1000 units/hr.  PMH: Dementia, CAD  Goal of Therapy:  Heparin level 0.3-0.7 units/ml Monitor platelets by anticoagulation protocol: Yes   Plan:  Continue heparin at 1000 units/hr  F/u daily heparin level & CBC Monitor for S&S of bleeding  Andria MeuseSimone Eviana Sibilia, PharmD Candidate 12/28/2015,12:07 PM

## 2015-12-28 NOTE — Progress Notes (Signed)
ANTICOAGULATION CONSULT NOTE - Follow-up Consult  Pharmacy Consult for Heparin Indication: pulmonary embolus (rule out)  Allergies  Allergen Reactions  . Prednisone Palpitations    See 02/07/14 he feels this caused chest pain    Patient Measurements: Height: 5\' 7"  (170.2 cm) Weight: 144 lb 13.5 oz (65.7 kg) IBW/kg (Calculated) : 66.1   Vital Signs: Temp: 98.3 F (36.8 C) (04/21 0335) Temp Source: Oral (04/21 0335) BP: 102/69 mmHg (04/21 0329) Pulse Rate: 113 (04/21 0329)  Labs:  Recent Labs  12/27/15 1900 12/28/15 0040 12/28/15 0300 12/28/15 0301  HGB 12.8*  --   --  11.6*  HCT 37.8*  --   --  34.4*  PLT 177  --   --  187  APTT  --  141*  --   --   LABPROT  --  17.0*  --   --   INR  --  1.38  --   --   HEPARINUNFRC  --   --  0.50  --   CREATININE 1.51*  --   --  1.42*  TROPONINI  --   --   --  0.71*    Estimated Creatinine Clearance: 37.9 mL/min (by C-G formula based on Cr of 1.42).   Assessment: 82 year old male s/p recent hospitalization for left hip fracture returns with low oxygen saturations and confusion.  Pt on heparin for possible pulmonary embolus. Heparin level therapeutic on 1000 units/hr.  PMH: Dementia, CAD  Goal of Therapy:  Heparin level 0.3-0.7 units/ml Monitor platelets by anticoagulation protocol: Yes   Plan:  Continue heparin at 1000 units/hr  F/u 6 hr confirmatory heparin level  Christoper Fabianaron Aqeel Norgaard, PharmD, BCPS Clinical pharmacist, pager [phone] 12/28/2015,4:14 AM

## 2015-12-28 NOTE — Sedation Documentation (Signed)
Patient is resting comfortably. Will continue to assess need for sedation

## 2015-12-28 NOTE — Progress Notes (Signed)
VASCULAR LAB PRELIMINARY  PRELIMINARY  PRELIMINARY  PRELIMINARY  Bilateral lower extremity venous duplex completed.    Preliminary report:  Right:  No evidence of DVT, superficial thrombosis, or Baker's cyst. Left - Acute mobile DVT noted in the distal common femoral and coursing to the mid femoral veins..  No evidence of superficial thrombosis.  No Baker's cyst.  Report was given face to face to Health Netlex N. Wilson, DO  [place] Ridge[place]UGH[place], [place]bot Monarch, RVS 12/28/2015, 10:18 AM

## 2015-12-28 NOTE — Progress Notes (Signed)
80ml propofol wasted; witnessed by Hazel Samshristian Smith RN  Pt TF not started to keep NPO for filter placement in IR  Felipa EmoryJarrell, May Ozment Denise

## 2015-12-28 NOTE — Progress Notes (Signed)
PT Cancellation Note  Patient Details Name: Maxwell ShipperRichard C Loman MRN: [id number] DOB: 03/18/1932   Cancelled Treatment:    Reason Eval/Treat Not Completed: Patient not medically ready.  Pt currently on strict bedrest and noted elevated troponins.  Will hold PT and mobility at this time and f/u as appropriate.     Sunny SchleinRitenour, Hartleigh Edmonston F, South CarolinaPT [phone] 12/28/2015, 8:28 AM

## 2015-12-28 NOTE — Progress Notes (Deleted)
Patient resting comfortably on nasal cannula.  No bipap needed at this time.  Will continue to monitor.

## 2015-12-29 ENCOUNTER — Inpatient Hospital Stay (HOSPITAL_COMMUNITY): Payer: Medicare Other

## 2015-12-29 DIAGNOSIS — R0600 Dyspnea, unspecified: Secondary | ICD-10-CM

## 2015-12-29 DIAGNOSIS — I2699 Other pulmonary embolism without acute cor pulmonale: Secondary | ICD-10-CM

## 2015-12-29 LAB — CBC
HEMATOCRIT: 29.8 % — AB (ref 39.0–52.0)
Hemoglobin: 9.9 g/dL — ABNORMAL LOW (ref 13.0–17.0)
MCH: 30.3 pg (ref 26.0–34.0)
MCHC: 33.2 g/dL (ref 30.0–36.0)
MCV: 91.1 fL (ref 78.0–100.0)
Platelets: 165 10*3/uL (ref 150–400)
RBC: 3.27 MIL/uL — ABNORMAL LOW (ref 4.22–5.81)
RDW: 13.4 % (ref 11.5–15.5)
WBC: 8.2 10*3/uL (ref 4.0–10.5)

## 2015-12-29 LAB — T3, FREE: T3, Free: 1.3 pg/mL — ABNORMAL LOW (ref 2.0–4.4)

## 2015-12-29 LAB — GLUCOSE, CAPILLARY
GLUCOSE-CAPILLARY: 107 mg/dL — AB (ref 65–99)
GLUCOSE-CAPILLARY: 112 mg/dL — AB (ref 65–99)
GLUCOSE-CAPILLARY: 113 mg/dL — AB (ref 65–99)
Glucose-Capillary: 108 mg/dL — ABNORMAL HIGH (ref 65–99)
Glucose-Capillary: 124 mg/dL — ABNORMAL HIGH (ref 65–99)

## 2015-12-29 LAB — ECHOCARDIOGRAM COMPLETE
HEIGHTINCHES: 67 in
WEIGHTICAEL: 2335.11 [oz_av]

## 2015-12-29 LAB — HEMOGLOBIN A1C
HEMOGLOBIN A1C: 5.5 % (ref 4.8–5.6)
Mean Plasma Glucose: 111 mg/dL

## 2015-12-29 LAB — BASIC METABOLIC PANEL
Anion gap: 9 (ref 5–15)
BUN: 25 mg/dL — ABNORMAL HIGH (ref 6–20)
CALCIUM: 8.4 mg/dL — AB (ref 8.9–10.3)
CO2: 22 mmol/L (ref 22–32)
Chloride: 106 mmol/L (ref 101–111)
Creatinine, Ser: 1.13 mg/dL (ref 0.61–1.24)
GFR, EST NON AFRICAN AMERICAN: 59 mL/min — AB (ref >60)
Glucose, Bld: 121 mg/dL — ABNORMAL HIGH (ref 65–99)
Potassium: 3.5 mmol/L (ref 3.5–5.1)
Sodium: 137 mmol/L (ref 135–145)

## 2015-12-29 LAB — TROPONIN I: Troponin I: 0.49 ng/mL — ABNORMAL HIGH (ref <0.031)

## 2015-12-29 LAB — MAGNESIUM
Magnesium: 2.2 mg/dL (ref 1.7–2.4)
Magnesium: 2.2 mg/dL (ref 1.7–2.4)

## 2015-12-29 LAB — PHOSPHORUS
PHOSPHORUS: 2 mg/dL — AB (ref 2.5–4.6)
Phosphorus: 2.4 mg/dL — ABNORMAL LOW (ref 2.5–4.6)

## 2015-12-29 LAB — HEPARIN LEVEL (UNFRACTIONATED): HEPARIN UNFRACTIONATED: 0.58 [IU]/mL (ref 0.30–0.70)

## 2015-12-29 MED ORDER — CHLORHEXIDINE GLUCONATE 0.12 % MT SOLN
15.0000 mL | Freq: Two times a day (BID) | OROMUCOSAL | Status: DC
  Administered 2015-12-29 – 2015-12-30 (×2): 15 mL via OROMUCOSAL

## 2015-12-29 MED ORDER — MORPHINE SULFATE (PF) 2 MG/ML IV SOLN
2.0000 mg | Freq: Once | INTRAVENOUS | Status: AC
  Administered 2015-12-29: 2 mg via INTRAVENOUS

## 2015-12-29 MED ORDER — SODIUM CHLORIDE 0.9% FLUSH
10.0000 mL | Freq: Two times a day (BID) | INTRAVENOUS | Status: DC
  Administered 2015-12-29 – 2016-01-02 (×8): 10 mL via INTRAVENOUS

## 2015-12-29 MED ORDER — DEXTROSE-NACL 5-0.45 % IV SOLN
INTRAVENOUS | Status: DC
  Administered 2015-12-29 – 2015-12-30 (×2): via INTRAVENOUS

## 2015-12-29 MED ORDER — CETYLPYRIDINIUM CHLORIDE 0.05 % MT LIQD
7.0000 mL | Freq: Two times a day (BID) | OROMUCOSAL | Status: DC
  Administered 2015-12-30: 7 mL via OROMUCOSAL

## 2015-12-29 MED ORDER — MORPHINE SULFATE (PF) 2 MG/ML IV SOLN
INTRAVENOUS | Status: AC
  Filled 2015-12-29: qty 1

## 2015-12-29 NOTE — Procedures (Signed)
Extubation Procedure Note  Patient Details:   Name: Osvaldo ShipperRichard C Koopmann DOB: 09/06/1932 MRN: [id number]   Airway Documentation:     Evaluation  O2 sats: stable throughout Complications: No apparent complications Patient did tolerate procedure well. Bilateral Breath Sounds: Clear, Diminished   Yes able to voice.  Pt extubated at this time per MD order, placed on 4L [place] tolerating well, no distress noted.  Cherylin MylarDoyle, Gloriann Riede 12/29/2015, 3:29 PM

## 2015-12-29 NOTE — Progress Notes (Signed)
50 ml propofol wasted in sharps. Witnessed by crystal RN

## 2015-12-29 NOTE — Progress Notes (Addendum)
PT Cancellation Note  Patient Details Name: Maxwell Copeland MRN: [id number] DOB: 09/05/1932   Cancelled Treatment:    Reason Eval/Treat Not Completed: Patient not medically ready.  Patient c/o acute CP and HTN following extubation.  Cardiology consulted.  Will hold PT today.  Will return at later date for PT evaluation if appropriate for patient.   Vena AustriaDavis, Shatoria Stooksbury H 12/29/2015, 7:04 PM Durenda HurtSusan H. Renaldo Fiddleravis, PT, [facility] Acute Rehab Services Pager [phone]

## 2015-12-29 NOTE — Progress Notes (Signed)
Pt acutely c/o CP and is diaphoretic. BP 132/101 and is ST in the 100's. Np Tammy Parrett made aware- order for stat EKG. Will monitor Maxwell Copeland, Maxwell Copeland

## 2015-12-29 NOTE — Progress Notes (Signed)
ANTICOAGULATION CONSULT NOTE - Follow-up Consult  Pharmacy Consult for Heparin Indication: pulmonary embolus (rule out)  Allergies  Allergen Reactions  . Acyclovir And Related   . Prednisone Palpitations    See 02/07/14 he feels this caused chest pain    Patient Measurements: Height: 5\' 7"  (170.2 cm) Weight: 145 lb 15.1 oz (66.2 kg) IBW/kg (Calculated) : 66.1   Vital Signs: Temp: 98.7 F (37.1 C) (04/22 0751) Temp Source: Oral (04/22 0751) BP: 115/69 mmHg (04/22 0900) Pulse Rate: 101 (04/22 0900)  Labs:  Recent Labs  12/27/15 1900 12/28/15 0040 12/28/15 0300 12/28/15 0301 12/28/15 1003 12/29/15 0322  HGB 12.8*  --   --  11.6*  --  9.9*  HCT 37.8*  --   --  34.4*  --  29.8*  PLT 177  --   --  187  --  165  APTT  --  141*  --   --   --   --   LABPROT  --  17.0*  --   --   --   --   INR  --  1.38  --   --   --   --   HEPARINUNFRC  --   --  0.50  --  0.42 0.58  CREATININE 1.51*  --   --  1.42*  --  1.13  TROPONINI  --   --   --  0.71* 0.78*  --     Estimated Creatinine Clearance: 47.9 mL/min (by C-G formula based on Cr of 1.13).   Assessment: 82 year old male s/p recent hospitalization for left hip fracture returns with low oxygen saturations and confusion.  Pt on heparin for possible pulmonary embolus. Heparin level therapeutic on 1000 units/hr.  Patient also on asa/plavix  Patient had mobile DVT in femoral veins on doppler. Now s/p placement of retrievable IVC filter. No further imaging - assuming PE present. Pending ECHO  Goal of Therapy:  Heparin level 0.3-0.7 units/ml Monitor platelets by anticoagulation protocol: Yes   Plan:  Continue heparin at 1000 units/hr  F/u daily heparin level & CBC Monitor for S&S of bleeding F[facility]  Isaac BlissMichael Kerissa Coia, PharmD, BCPS, [facility] JoseBCCCP Clinical Pharmacist Pager [phone] 12/29/2015 10:37 AM

## 2015-12-29 NOTE — Progress Notes (Signed)
  Echocardiogram 2D Echocardiogram has been performed.  Janalyn HarderWest, Andreah Goheen R 12/29/2015, 11:21 AM

## 2015-12-29 NOTE — Progress Notes (Signed)
Brief note :  Pt complaining on acute chest pain s/p extubation  O2 sats 96% on O2 . No increased WOB .  EKG shows sign ST depression in V2-V6 c/w Acute MI  Stat labs for tropoinin ordered.  Pt remains on Hep Drip .  Morphine 2mg  IV x 1 given .  Family and pt updated.  Cardiology Dr. Ross/[facility] paged for stat consult. Last seen for consult 4/16.   Devynn Scheff NP-C  [facility]  12/29/2015 1700.

## 2015-12-29 NOTE — Progress Notes (Signed)
PULMONARY / CRITICAL CARE MEDICINE Progress Note   Name: Maxwell Copeland MRN: [id number] DOB: 05/25/1932    ADMISSION DATE:  12/27/2015 CONSULTATION DATE:  12/27/15  REFERRING MD:  EDP  CHIEF COMPLAINT:  SOB  HISTORY OF PRESENT ILLNESS:  Pt is encephelopathic; therefore, this HPI is obtained from chart review. Maxwell Copeland is a 82 y.o. male with PMH as outlined below including recent left non-displaced intertrochanteric femur fx for which he was recently hospitalized 12/22/15 through 12/25/15.  Due to his extensive cardiac hx including CAD and severe aortic stenosis, he was deemed a high risk surgical candidate.  After discussions with family, decision was made to defer surgery and manage the fracture conservatively.  He was seen in consultation by Dr. Charlann Boxer of orthopedics who instructed him to be NWB and to follow up with ortho as an outpatient 3 - 4 weeks post discharge.  Readmitted for hypoxia on 04/20 and empiric trx started for PE.  Awaiting confirmatory studies.   SUBJECTIVE:  Weaning well today 5/5.  Follows commands Generalized weakness -on low dose sedation  Venous doppler yest with left acute mobile DVT , IVC filter placed   VITAL SIGNS: BP 116/58 mmHg  Pulse 109  Temp(Src) 98.3 F (36.8 C) (Oral)  Resp 15  Ht  (1.702 m)  Wt 144 lb 13.5 oz (65.7 kg)  BMI 22.68 kg/m2  SpO2 99%  HEMODYNAMICS:    VENTILATOR SETTINGS: Vent Mode:  [-] PRVC FiO2 (%):  [60 %-100 %] 60 % Set Rate:  [14 bmp] 14 bmp Vt Set:  [510 mL] 510 mL PEEP:  [5 cmH20] 5 cmH20 Plateau Pressure:  [14 cmH20-16 cmH20] 15 cmH20  INTAKE / OUTPUT: I/O last 3 completed shifts: In: 1217.4 [I.V.:1177.4; NG/GT:40] Out: 975 [Urine:975]   PHYSICAL EXAMINATION: General: Elderly male, chronically ill appearing, in NAD. Neuro:  F/c , gen weakness on low dose sedation  HEENT: [place]/AT Cardiovascular: RRR, 4/6 SEM  Lungs: on vent, CTA, no w/r/r Abdomen: soft, NT/ND Ext:  No LE edema, left leg  in brace  Skin: Intact, warm, no rashes.  LABS:  BMET  Recent Labs Lab 12/24/15 0426 12/27/15 1900 12/28/15 0301  NA 139 137 136  K 4.0 4.9 4.2  CL 105 101 102  CO2 BUN 16 33* 30*  CREATININE 1.29* 1.51* 1.42*  GLUCOSE 99 124* 118*    Electrolytes  Recent Labs Lab 12/24/15 0426 12/27/15 1900 12/28/15 0301  CALCIUM 9.1 9.8 8.8*  MG  --   --  2.3  PHOS  --   --  3.6    CBC  Recent Labs Lab 12/24/15 0426 12/27/15 1900 12/28/15 0301  WBC 8.7 9.9 10.0  HGB 11.8* 12.8* 11.6*  HCT 35.9* 37.8* 34.4*  PLT 132* 177 187    Coag's  Recent Labs Lab 12/23/15 1030 12/28/15 0040  APTT  --  141*  INR 1.34 1.38    Sepsis Markers  Recent Labs Lab 12/27/15 1921  LATICACIDVEN 1.48    ABG  Recent Labs Lab 12/27/15 2207 12/28/15 0053  PHART 7.414 7.393  PCO2ART 38.9 42.3  PO2ART 45.0* 323.0*    Liver Enzymes  Recent Labs Lab 12/22/15 2123 12/27/15 1900 12/28/15 0301  AST 30 40 48*  ALT 16* 22 20  ALKPHOS 91 72 62  BILITOT 0.7 1.1 1.8*  ALBUMIN 3.9 3.3* 3.0*    Cardiac Enzymes  Recent Labs Lab 12/28/15 0301  TROPONINI 0.71*    Glucose  Recent Labs  Lab 12/28/15 0103  GLUCAP 122*    Imaging Dg Chest Portable 1 View  12/27/2015  CLINICAL DATA:  Post intubation. EXAM: PORTABLE CHEST 1 VIEW COMPARISON:  Earlier this day at 1918 hour FINDINGS: Endotracheal tube 4.9 cm from the carina. There is increased bibasilar atelectasis from prior, left greater than right. Heart is normal in size. No pulmonary edema large pleural effusion or pneumothorax. IMPRESSION: Endotracheal tube 4.9 cm from the carina. Increasing bibasilar atelectasis. Electronically Signed   By: Rubye OaksMelanie  Ehinger M.D.   On: 12/27/2015 23:29   Dg Chest Portable 1 View  12/27/2015  CLINICAL DATA:  Shortness of breath, irregular heartbeat, congestion, hypertension, stroke, former smoker EXAM: PORTABLE CHEST 1 VIEW COMPARISON:  Portable exam 1918 hours compared to  12/23/2015 FINDINGS: Normal heart size with note of a coronary arterial stent. Atherosclerotic calcification aorta. Mediastinal contours and pulmonary vascularity normal. Bibasilar atelectasis. Lungs otherwise clear. No pleural effusion or pneumothorax. Bones demineralized. IMPRESSION: Bibasilar atelectasis. Electronically Signed   By: Ulyses SouthwardMark  Boles M.D.   On: 12/27/2015 19:25     STUDIES:  CXR 04/20 > bibasilar atx. CT head 04/20 > V/Q 04/21 > Echo 04/21 > LE duplex 04/21 >acute left DVT    CULTURES: None.  ANTIBIOTICS: None.  SIGNIFICANT EVENTS: 12/22/15 through 12/25/15 > admitted after mechanical fall at home where he sustained closed left hip fx.  Decision made to treat non-operatively. 12/27/15 > admitted with acute hypoxemic respiratory failure, presumed due to PE.  Required intubation in ED. 4/21 IVC filter placed   LINES/TUBES: ETT 04/20 >  DISCUSSION: 82 y.o. M with recent closed left femur fx.  Discharged to SNF then re-admitted just 2 days later with acute hypoxemic respiratory failure.  History highly suspicious for PE therefore, pt started on empiric heparin gtt.   ASSESSMENT / PLAN:  PULMONARY A: Acute hypoxemic respiratory failure - due to presumed PE. Presumed PE - had recent closed left hip femur fx that is being treated conservatively, just discharged 2 days ago after. Hx emphysema. P:   Wean as able., evaluate for extubation .  VAP prevention measures. Will need transition to oral agent if indeed this is PE. VQ scan -consider if extubated.  LE duplex - +acute mobile DVT on left  Budesonide / Brovana in lieu of outpatient symbicort. Albuterol PRN. CXR in AM.  CARDIOVASCULAR A:  Hx HTN, HLD, CAD (reportely refused CABG in the past), severe aortic stenosis, AAA. Troponin leak,  Echo w/ EF 60-65%, Gr 1 DD , severe AV calficification , no sign of right sided strain , small pericardial effusion .  P:  Monitor hemodynamics. Hydralazine PRN. Trend  troponins. Continue to hold outpatient amlodipine, imdur, lopressor, nitro. Continue outpatient ASA, plavix, atorvastatin.  Would d/c plavix and continue ASA + oral A/C if long-term A/C is needed. Cont Heparin   NEUROLOGIC A:    Acute metabolic encephalopathy. Hx falls, depression. P:   Sedation:  Propofol gtt / Fentanyl PRN. RASS goal: 0 to -1. Daily WUA. -wean sedation , eval for extubation  -consider CT head if when off sedation weakness does not improve   RENAL A:   CKD. P:   Continue NS @ 75 for now, monitor UOP Monitor BMP  GASTROINTESTINAL A:   GI prophylaxis. Nutrition. P:   SUP: Pantoprazole. NPO, start TF tomorrow if still NPO  HEMATOLOGIC A:   Acute Left mobile DVT s/p IVC filter  P: Heparin gtt for presumed PE CBC daily, heparin levels  INFECTIOUS A:  No evidence of infection. P:   Monitor clinically.  ENDOCRINE A:   Hypothyroidism.  TSH 21.9 this admission, likely euthyroid sick syndrome. P:   Continue outpatient synthroid IV for now Check free T4 and T3.  MUSCULOSKELETAL  A  Left Hip Fracture s/p surgery  P  Cont to monitor .   Family updated: daughter at bedside  Interdisciplinary Family Meeting v Palliative Care Meeting:  Due by: 01/02/16.  Tymel Conely NP-C  [facility]  12/29/2015

## 2015-12-30 ENCOUNTER — Inpatient Hospital Stay (HOSPITAL_COMMUNITY): Payer: Medicare Other

## 2015-12-30 DIAGNOSIS — I6350 Cerebral infarction due to unspecified occlusion or stenosis of unspecified cerebral artery: Secondary | ICD-10-CM

## 2015-12-30 DIAGNOSIS — I739 Peripheral vascular disease, unspecified: Secondary | ICD-10-CM

## 2015-12-30 DIAGNOSIS — I350 Nonrheumatic aortic (valve) stenosis: Secondary | ICD-10-CM

## 2015-12-30 DIAGNOSIS — I2690 Septic pulmonary embolism without acute cor pulmonale: Secondary | ICD-10-CM

## 2015-12-30 DIAGNOSIS — I213 ST elevation (STEMI) myocardial infarction of unspecified site: Secondary | ICD-10-CM

## 2015-12-30 DIAGNOSIS — E7800 Pure hypercholesterolemia, unspecified: Secondary | ICD-10-CM

## 2015-12-30 DIAGNOSIS — I2699 Other pulmonary embolism without acute cor pulmonale: Principal | ICD-10-CM

## 2015-12-30 DIAGNOSIS — Z66 Do not resuscitate: Secondary | ICD-10-CM

## 2015-12-30 LAB — CBC
HCT: 31.5 % — ABNORMAL LOW (ref 39.0–52.0)
HEMOGLOBIN: 10.4 g/dL — AB (ref 13.0–17.0)
MCH: 29.9 pg (ref 26.0–34.0)
MCHC: 33 g/dL (ref 30.0–36.0)
MCV: 90.5 fL (ref 78.0–100.0)
Platelets: 204 10*3/uL (ref 150–400)
RBC: 3.48 MIL/uL — AB (ref 4.22–5.81)
RDW: 13.2 % (ref 11.5–15.5)
WBC: 9.1 10*3/uL (ref 4.0–10.5)

## 2015-12-30 LAB — TROPONIN I
TROPONIN I: 12.7 ng/mL — AB (ref <0.031)
Troponin I: 7.53 ng/mL (ref <0.031)

## 2015-12-30 LAB — URINE CULTURE

## 2015-12-30 LAB — BASIC METABOLIC PANEL
Anion gap: 12 (ref 5–15)
BUN: 11 mg/dL (ref 6–20)
CHLORIDE: 104 mmol/L (ref 101–111)
CO2: 20 mmol/L — AB (ref 22–32)
CREATININE: 0.89 mg/dL (ref 0.61–1.24)
Calcium: 8.5 mg/dL — ABNORMAL LOW (ref 8.9–10.3)
GFR calc Af Amer: >60 mL/min (ref >60)
GFR calc non Af Amer: >60 mL/min (ref >60)
GLUCOSE: 99 mg/dL (ref 65–99)
Potassium: 3.3 mmol/L — ABNORMAL LOW (ref 3.5–5.1)
Sodium: 136 mmol/L (ref 135–145)

## 2015-12-30 LAB — GLUCOSE, CAPILLARY: Glucose-Capillary: 110 mg/dL — ABNORMAL HIGH (ref 65–99)

## 2015-12-30 LAB — HEPARIN LEVEL (UNFRACTIONATED): Heparin Unfractionated: 0.31 IU/mL (ref 0.30–0.70)

## 2015-12-30 LAB — PHOSPHORUS: PHOSPHORUS: 1.8 mg/dL — AB (ref 2.5–4.6)

## 2015-12-30 LAB — MAGNESIUM: Magnesium: 1.9 mg/dL (ref 1.7–2.4)

## 2015-12-30 MED ORDER — POTASSIUM CHLORIDE 10 MEQ/100ML IV SOLN
10.0000 meq | INTRAVENOUS | Status: AC
  Administered 2015-12-30 (×2): 10 meq via INTRAVENOUS
  Filled 2015-12-30 (×2): qty 100

## 2015-12-30 MED ORDER — ATORVASTATIN CALCIUM 80 MG PO TABS
80.0000 mg | ORAL_TABLET | Freq: Every day | ORAL | Status: DC
  Administered 2015-12-30 – 2016-01-01 (×3): 80 mg via ORAL
  Filled 2015-12-30 (×3): qty 1

## 2015-12-30 MED ORDER — OXYCODONE-ACETAMINOPHEN 5-325 MG PO TABS
1.0000 | ORAL_TABLET | Freq: Three times a day (TID) | ORAL | Status: DC | PRN
  Administered 2015-12-30: 2 via ORAL
  Administered 2015-12-30 – 2015-12-31 (×4): 1 via ORAL
  Administered 2016-01-01: 2 via ORAL
  Filled 2015-12-30 (×3): qty 1
  Filled 2015-12-30: qty 2
  Filled 2015-12-30 (×2): qty 1
  Filled 2015-12-30: qty 2

## 2015-12-30 MED ORDER — OXYCODONE-ACETAMINOPHEN 5-325 MG PO TABS
1.0000 | ORAL_TABLET | Freq: Once | ORAL | Status: AC
  Administered 2015-12-30: 1 via ORAL

## 2015-12-30 MED ORDER — SODIUM PHOSPHATE 3 MMOLE/ML IV SOLN
20.0000 mmol | Freq: Once | INTRAVENOUS | Status: AC
  Administered 2015-12-30: 20 mmol via INTRAVENOUS
  Filled 2015-12-30: qty 6.67

## 2015-12-30 MED ORDER — ISOSORBIDE MONONITRATE ER 30 MG PO TB24
30.0000 mg | ORAL_TABLET | Freq: Every morning | ORAL | Status: DC
  Administered 2015-12-30 – 2016-01-02 (×4): 30 mg via ORAL
  Filled 2015-12-30 (×4): qty 1

## 2015-12-30 MED ORDER — METOPROLOL TARTRATE 25 MG PO TABS
25.0000 mg | ORAL_TABLET | Freq: Two times a day (BID) | ORAL | Status: DC
  Administered 2015-12-30 – 2016-01-02 (×7): 25 mg via ORAL
  Filled 2015-12-30 (×7): qty 1

## 2015-12-30 MED ORDER — CLOPIDOGREL BISULFATE 75 MG PO TABS
75.0000 mg | ORAL_TABLET | Freq: Every day | ORAL | Status: DC
  Administered 2015-12-31: 75 mg via ORAL
  Filled 2015-12-30: qty 1

## 2015-12-30 MED ORDER — RESOURCE THICKENUP CLEAR PO POWD
ORAL | Status: DC | PRN
  Filled 2015-12-30 (×2): qty 125

## 2015-12-30 NOTE — Progress Notes (Signed)
eLink Physician-Brief Progress Note Patient Name: Maxwell ShipperRichard C Wareing DOB: 05/07/1932 MRN: [id number]   Date of Service  12/30/2015  HPI/Events of Note  Patient c/o L hip pain. Known Fx.   eICU Interventions  Will order Percocet 5 mg/325 mg PO now (extra dose).     Intervention Category Intermediate Interventions: Pain - evaluation and management  Sommer,Steven Eugene 12/30/2015, 9:02 PM

## 2015-12-30 NOTE — Progress Notes (Signed)
ANTICOAGULATION CONSULT NOTE - Follow-up Consult  Pharmacy Consult for Heparin Indication: pulmonary embolus (rule out)  Allergies  Allergen Reactions  . Acyclovir And Related   . Prednisone Palpitations    See 02/07/14 he feels this caused chest pain    Patient Measurements: Height: 5\' 7"  (170.2 cm) Weight: 143 lb 4.8 oz (65 kg) IBW/kg (Calculated) : 66.1   Vital Signs: Temp: 98.6 F (37 C) (04/23 0744) Temp Source: Oral (04/23 0744) BP: 157/77 mmHg (04/23 0800) Pulse Rate: 93 (04/23 0800)  Labs:  Recent Labs  12/28/15 0040  12/28/15 0301 12/28/15 1003 12/29/15 0322 12/29/15 1738 12/29/15 2213 12/30/15 0456  HGB  --   --  11.6*  --  9.9*  --   --  10.4*  HCT  --   --  34.4*  --  29.8*  --   --  31.5*  PLT  --   --  187  --  165  --   --  204  APTT 141*  --   --   --   --   --   --   --   LABPROT 17.0*  --   --   --   --   --   --   --   INR 1.38  --   --   --   --   --   --   --   HEPARINUNFRC  --   < >  --  0.42 0.58  --   --  0.31  CREATININE  --   --  1.42*  --  1.13  --   --  0.89  TROPONINI  --   < > 0.71* 0.78*  --  0.49* 7.53* 12.70*  < > = values in this interval not displayed.  Estimated Creatinine Clearance: 59.8 mL/min (by C-G formula based on Cr of 0.89).   Assessment: 82 year old male s/p recent hospitalization for left hip fracture returns with low oxygen saturations and confusion.  Pt on heparin for possible pulmonary embolus. Heparin level therapeutic on 1000 units/hr.  Patient also on asa/plavix  Patient had mobile DVT in femoral veins on doppler. Now s/p placement of retrievable IVC filter. No further imaging - assuming PE present. Pending ECHO  Trop continues to rise, had ST depressions on EKG s/p extubation. Pending cards re-consult  Goal of Therapy:  Heparin level 0.3-0.7 units/ml Monitor platelets by anticoagulation protocol: Yes   Plan:  Continue heparin at 1000 units/hr  F/u daily heparin level & CBC Monitor for S&S of  bleeding [facility]  Isaac BlissMichael Skyleigh Windle, PharmD, BCPS, [facility] Center[facility]CCCP Clinical Pharmacist Pager [phone] 12/30/2015 9:01 AM

## 2015-12-30 NOTE — Progress Notes (Signed)
CRITICAL VALUE ALERT  Critical value received:  Troponin 7.53  Date of notification:  12/30/2015  Time of notification:  04:25  Critical value read back:Yes.    Nurse who received alert:  Dustin Flockhris Hayes, RN  MD notified (1st page):  Deterding, E.  Time of first page:  04:40  MD notified (2nd page):  Time of second page:  Responding MD:  Deterding, E.  Time MD responded:  04:40

## 2015-12-30 NOTE — Progress Notes (Signed)
[facility] FOR AM LAB REPLACEMENT ONLY  The patient does apply for the [facility] Electrolyte [facility]plac[facility] Protocol based on the criteria listed below:   1. Is GFR >/= 40 ml/min? Yes.    Patient's GFR today is >60 2. Is urine output >/= 0.5 ml/kg/hr for the last 6 hours? Yes.   Patient's UOP is 2.2 ml/kg/hr 3. Is BUN < 60 mg/dL? Yes.    Patient's BUN today is 11 4. Abnormal electrolyte(s): K+3.3 Phos 1.8 5. Ordered repletion with: protocol 6. If a panic level lab has been reported, has the CCM MD in charge been notified? No..   Physician:  E Deterding  Cathlean CowerBradshaw, Latayvia Mandujano [facility] 12/30/2015 6:35 AM

## 2015-12-30 NOTE — Progress Notes (Signed)
Palliative consultation request received Discussed with Dr. Andrey CampanileWilson Chart reviewed: 82 year old gentleman with history of falls, recent hip fracture. Patient was deemed high risk for surgery due to underlying aortic stenosis and coronary artery disease. Patient has been admitted with DVT left lower extremity status post IVC filter placement. Hospitalization course thus far also complicated by acute hypoxic respiratory failure, status post ventilator dependent respiratory failure, extubated 4-22. Was being treated for presumed pulmonary embolism with blood thinners. Developed hemoptysis on 4-23. Developed non-ST segment elevation MI post extubation. Cardiology following, patient refusing cardiac intervention such as catheterizations and has done this in the past. CODE STATUS established as DO NOT RESUSCITATE. Palliative called in for additional discussions. Patient seen briefly Call placed and discussed with daughter June at [phone] Plan: Family meeting on 4-24 for engaging in additional goals of care discussions, disposition discussions, discussions pertaining to risks benefits of anticoagulation, pain control, overall goals of care.  Rosalin HawkingZeba Makenzey Nanni MD [facility] palliative medicine [phone] pager

## 2015-12-30 NOTE — Progress Notes (Signed)
[facility] and [facility] agrees to assume care of the patient tomorrow, 12/31/15.  Transfer to med-surg today.

## 2015-12-30 NOTE — Evaluation (Addendum)
Clinical/Bedside Swallow Evaluation Patient Details  Name: Maxwell Copeland MRN: [id number] Date of Birth: 05/24/1932  Today's Date: 12/30/2015 Time: SLP Start Time (ACUTE ONLY): 0845 SLP Stop Time (ACUTE ONLY): 0910 SLP Time Calculation (min) (ACUTE ONLY): 25 min  Past Medical History:  Past Medical History  Diagnosis Date  . Coronary artery disease     LAD w/ Ca++, 100% RCA, in-astent stenosis Cx, high grade lesion Ramus  . Hypercholesteremia   . Dyspnea   . Hypothyroid   . CVA (cerebral infarction)   . Arthritis     hips, will give out  . Varicella   . Depression     never took any medication.  . Emphysema of lung (HCC)     smoking related.  . Hypertension   . Stroke [facility](HCC)     left hemiparesis-100% recovery  . AAA (abdominal aortic aneurysm) (HCC)     small  . Severe aortic stenosis 2012   Past Surgical History:  Past Surgical History  Procedure Laterality Date  . Coronary angioplasty with stent placement      Several stents, last in 2004   HPI:  Pt is an 82 y.o. male with pMH severe CAD and aortic stenosis (he declined CABG/AVR in the past, diastolic dysfunction on last echo in 2015, and prior CVA who presented to ED 4/16  after having a mechanical fall at home, sustained a nondisplaced L proximal femur fracture. Pt d/c'd 4/18 and returned 4/20 with AMS and hypoxia and was intubated, extubated 4/22. Bedside swallow eval ordered to assess swallow function post-extubation.   Assessment / Plan / Recommendation Clinical Impression  Pt had consistent s/s of aspiration with trials of thin liquids (throat clear); suspect delayed swallow initiation with these trials. Nectar thick consistency attempted without s/s of aspiration. Pt reports typically eating "softer foods" at home. Pt oriented to self/ location but somewhat disoriented to situation and time. Given s/s of aspiration at bedside, current respiratory/ post-extubation status and continued AMS, aspiration risk appears  moderate, risk reduced with diet modifications and strategies. Recommend initiating dysphagia 3 diet with nectar-thick liquids- no straws, meds whole with puree, full supervision during meals to cue pt to take small bites/ sips and ensure pt in upright position. Suspect that this is an acute reversible dysphagia. Will continue to follow for diet tolerance/ advancement.    Aspiration Risk  Moderate aspiration risk    Diet Recommendation Dysphagia 3 (Mech soft);Nectar-thick liquid   Liquid Administration via: Cup;No straw Medication Administration: Whole meds with puree Supervision: Patient able to self feed;Full supervision/cueing for compensatory strategies Compensations: Slow rate;Small sips/bites Postural Changes: Seated upright at 90 degrees    Other  Recommendations Oral Care Recommendations: Oral care BID Other Recommendations: Order thickener from pharmacy;Clarify dietary restrictions   Follow up Recommendations  24 hour supervision/assistance    Frequency and Duration min 2x/week  2 weeks       Prognosis Prognosis for Safe Diet Advancement: Good      Swallow Study   General HPI: Pt is an 82 y.o. male with pMH severe CAD and aortic stenosis (he declined CABG/AVR in the past, diastolic dysfunction on last echo in 2015, and prior CVA who presented to ED 4/16  after having a mechanical fall at home, sustained a nondisplaced L proximal femur fracture. Pt d/c'd 4/18 and returned 4/20 with AMS and hypoxia and was intubated, extubated 4/22. Bedside swallow eval ordered to assess swallow function post-extubation. Type of Study: Bedside Swallow Evaluation Diet Prior to this  Study: NPO Temperature Spikes Noted: No Respiratory Status: Nasal cannula History of Recent Intubation: Yes Length of Intubations (days): 2 days Date extubated: 12/29/15 Behavior/Cognition: Alert;Cooperative Oral Cavity Assessment: Within Functional Limits Oral Care Completed by SLP: Recent completion by  staff Oral Cavity - Dentition: Edentulous Vision: Functional for self-feeding Self-Feeding Abilities: Able to feed self Patient Positioning: Upright in bed Baseline Vocal Quality: Hoarse Volitional Cough: Strong Volitional Swallow: Able to elicit    Oral/Motor/Sensory Function Overall Oral Motor/Sensory Function: Within functional limits   Ice Chips Ice chips: Within functional limits Presentation: Spoon   Thin Liquid Thin Liquid: Impaired Presentation: Cup;Straw Pharyngeal  Phase Impairments: Throat Clearing - Immediate;Suspected delayed Swallow    Nectar Thick Nectar Thick Liquid: Impaired Presentation: Cup Pharyngeal Phase Impairments: Suspected delayed Swallow   Honey Thick Honey Thick Liquid: Not tested   Puree Puree: Within functional limits Presentation: Self Fed;Spoon   Solid   GO   Solid: Impaired Presentation: Self Fed Oral Phase Impairments: Impaired mastication        Metro Kung, MA, CCC-SLP 12/30/2015,9:24 AM [phone]

## 2015-12-30 NOTE — Progress Notes (Signed)
PT Cancellation Note  Patient Details Name: Maxwell ShipperRichard C Copeland MRN: [id number] DOB: 11/25/1931   Cancelled Treatment:    Reason Eval/Treat Not Completed: Medical issues which prohibited therapy - ECG confirms acute MI 4/22.  Given change in medical status after initial PT eval entered, will need new order to proceed with mobility assessment -- MD please place activity order for PT eval if indicated.  Thank you,     Dennis BastMartin, Kanesha Cadle Galloway 12/30/2015, 11:33 AM

## 2015-12-30 NOTE — Consult Note (Signed)
CARDIOLOGY CONSULT NOTE  Patient ID: Maxwell Copeland MRN: [id number] DOB/AGE: [birth year] 82 y.o.  Admit date: 12/27/2015 Primary Physician: [name], [name] Referring Physician: Andrey Campanile MD Primary cardiologist: Eden Emms MD  Reason for Consultation: chest pain, elevated troponin  HPI: The patient is an 82 yr old male with a complex medical history. He has CAD with last coronary angiogram I find was performed on 05/09/11 at which time he had moderate to severe CAD involving the LAD (mid 75-80%), D1 (prox 75-80%), high OM1 lesion, and a known small, non-dominant occluded RCA with left to right and right to right collaterals. He had severe aortic stenosis. It was felt he was a poor candidate for CABG and AVR nor TAVR, and he was medically managed. Dr. Eden Emms had another discussion with him in 05/2011 and he refused regardless. Another conversation was held in 08/2011 and he refused at that time as well.  I saw him in consultation in 03/2014 for chest pain and NSTEMI. He again refused CABG and cath in general. He was managed medically (troponin peaked at 6.24).  He then saw Tereso Newcomer PA-C 04/11/14 (last cardiology fu visit) and continued to refuse CABG/AVR.  He was evaluated by Dr. Tenny Craw for preoperative risk stratification for left proximal femoral fracture and was deemed high risk.  Echo 12/29/15: Normal LV systolic function, EF 60-65%, grade 1 diastolic dysfunction, moderate to severe aortic stenosis, severe mitral and aortic annular calcification, and a small pericardial effusion.  The fracture was managed conservatively after a long discussion with orthopedics.  He was readmitted for hypoxia 4/20 and is currently being treated presumptively for PE (with hemoptysis) with acute left femoral DVT. IVC filter placed 4/21.  Currently on heparin, ASA, Lipitor, and Plavix.  Developed chest pain 4/22 with troponins up to 12.7.  ECG showed acute infero-posterior MI on 12/29/15.  Was  intubated but now extubated.  Spoke with critical care regarding his overall clinical picture.  Pt currently denies chest pain. Says "I don't want my body punished any further".  I had a discussion with him regarding cardiac procedures and code status.  He refuses cardiac cath and wants to be DNR/DNI.      Allergies  Allergen Reactions  . Acyclovir And Related   . Prednisone Palpitations    See 02/07/14 he feels this caused chest pain    Current Facility-Administered Medications  Medication Dose Route Frequency Provider Last Rate Last Dose  . 0.9 %  sodium chloride infusion  250 mL Intravenous PRN Rahul P Desai, PA-C      . albuterol (PROVENTIL) (2.5 MG/3ML) 0.083% nebulizer solution 2.5 mg  2.5 mg Nebulization Q2H PRN Lorretta Harp, MD      . antiseptic oral rinse (CPC / CETYLPYRIDINIUM CHLORIDE 0.05%) solution 7 mL  7 mL Mouth Rinse q12n4p Kalman Shan, MD   0 mL at 12/29/15 1600  . arformoterol (BROVANA) nebulizer solution 15 mcg  15 mcg Nebulization BID Rahul P Desai, PA-C   15 mcg at 12/30/15 0740  . aspirin tablet 325 mg  325 mg Per Tube Daily Rahul P Desai, PA-C   325 mg at 12/30/15 0858  . atorvastatin (LIPITOR) tablet 80 mg  80 mg Per Tube q1800 Rahul P Desai, PA-C   80 mg at 12/28/15 1723  . budesonide (PULMICORT) nebulizer solution 0.5 mg  0.5 mg Nebulization BID Rahul P Desai, PA-C   0.5 mg at 12/30/15 0740  . chlorhexidine (PERIDEX) 0.12 % solution 15 mL  15 mL Mouth Rinse BID Kalman ShanMurali Ramaswamy, MD   15 mL at 12/30/15 0757  . clopidogrel (PLAVIX) tablet 75 mg  75 mg Per Tube Daily Rahul P Desai, PA-C   75 mg at 12/30/15 0858  . dextrose 5 %-0.45 % sodium chloride infusion   Intravenous Continuous Kalman ShanMurali Ramaswamy, MD 50 mL/hr at 12/30/15 0753    . docusate (COLACE) 50 MG/5ML liquid 100 mg  100 mg Oral BID Alyson ReedyWesam G Yacoub, MD   100 mg at 12/30/15 0900  . heparin ADULT infusion 100 units/mL (25000 units/250 mL)  1,000 Units/hr Intravenous Continuous Lorretta HarpXilin Niu, MD 10 mL/hr at  12/30/15 0800 1,000 Units/hr at 12/30/15 0800  . hydrALAZINE (APRESOLINE) injection 10-40 mg  10-40 mg Intravenous Q4H PRN Rahul P Desai, PA-C      . levothyroxine (SYNTHROID, LEVOTHROID) injection 56 mcg  56 mcg Intravenous QAC breakfast Rahul P Desai, PA-C   56 mcg at 12/30/15 0729  . ondansetron (ZOFRAN) tablet 4 mg  4 mg Oral Q6H PRN Lorretta HarpXilin Niu, MD       Or  . ondansetron [facility](ZOFRAN) injection 4 mg  4 mg Intravenous Q6H PRN Lorretta HarpXilin Niu, MD      . pantoprazole (PROTONIX) injection 40 mg  40 mg Intravenous QHS Rahul P Desai, PA-C   40 mg at 12/29/15 2259  . RESOURCE THICKENUP CLEAR   Oral PRN Kalman ShanMurali Ramaswamy, MD      . sodium chloride flush (NS) 0.9 % injection 10 mL  10 mL Intravenous Q12H Alyson ReedyWesam G Yacoub, MD   10 mL at 12/30/15 0859  . sodium phosphate 20 mmol in sodium chloride 0.9 % 250 mL infusion  20 mmol Intravenous Once Zigmund GottronElizabeth C Deterding, MD   20 mmol at 12/30/15 [id number]    Past Medical History  Diagnosis Date  . Coronary artery disease     LAD w/ Ca++, 100% RCA, in-astent stenosis Cx, high grade lesion Ramus  . Hypercholesteremia   . Dyspnea   . Hypothyroid   . CVA (cerebral infarction)   . Arthritis     hips, will give out  . Varicella   . Depression     never took any medication.  . Emphysema of lung (HCC)     smoking related.  . Hypertension   . Stroke [facility](HCC)     left hemiparesis-100% recovery  . AAA (abdominal aortic aneurysm) (HCC)     small  . Severe aortic stenosis 2012    Past Surgical History  Procedure Laterality Date  . Coronary angioplasty with stent placement      Several stents, last in 2004    Social History   Social History  . Marital Status: Widowed    Spouse Name: N/A  . Number of Children: N/A  . Years of Education: N/A   Occupational History  . retired    Social History Main Topics  . Smoking status: Former Smoker    Quit date: 09/09/1987  . Smokeless tobacco: Current User  . Alcohol Use: No  . Drug Use: No  . Sexual Activity: Not  Currently   Other Topics Concern  . Not on file   Social History Narrative   From [place] Lived [place] since 1960's. Married 47 years - widowed. 2 dtrs. Retired. Lives alone and independently but his dtr is close by.     No family history of premature CAD in 1st degree relatives.  Prior to Admission medications   Medication Sig Start Date End Date Taking? Authorizing Provider  acetaminophen (TYLENOL) 325 MG tablet Take 2 tablets (650 mg total) by mouth every 4 (four) hours as needed for mild pain, fever or headache. 03/21/14  Yes Eda Paschal Kilroy, PA-C  amLODipine (NORVASC) 10 MG tablet Take 10 mg by mouth every morning.    Yes Historical Provider, MD  aspirin 325 MG tablet Take 325 mg by mouth daily.   Yes Historical Provider, MD  atorvastatin (LIPITOR) 80 MG tablet Take 80 mg by mouth every morning.   Yes Historical Provider, MD  baclofen (LIORESAL) 10 MG tablet Take 1 tablet (10 mg total) by mouth 3 (three) times daily as needed for muscle spasms. 12/25/15  Yes Leroy Sea, MD  budesonide-formoterol (SYMBICORT) 160-4.5 MCG/ACT inhaler Inhale 2 puffs into the lungs 2 (two) times daily as needed (for shortness of breath). Reported on 09/13/2015 09/13/15  Yes Renee A Kuneff, DO  clopidogrel (PLAVIX) 75 MG tablet Take 75 mg by mouth every morning.    Yes Historical Provider, MD  docusate sodium (COLACE) 100 MG capsule Take 1 capsule (100 mg total) by mouth 2 (two) times daily. 12/25/15  Yes Leroy Sea, MD  isosorbide mononitrate (IMDUR) 30 MG 24 hr tablet Take 30 mg by mouth every morning.   Yes Historical Provider, MD  levothyroxine (SYNTHROID, LEVOTHROID) 112 MCG tablet Take 112 mcg by mouth daily before breakfast.    Yes Historical Provider, MD  metoprolol (LOPRESSOR) 50 MG tablet Take 0.5 tablets (25 mg total) by mouth 2 (two) times daily. 03/21/14  Yes Luke K Kilroy, PA-C  niacin (SLO-NIACIN) 500 MG tablet Take 500 mg by mouth every morning.    Yes Historical Provider, MD    nitroGLYCERIN (NITROSTAT) 0.4 MG SL tablet Place 1 tablet (0.4 mg total) under the tongue every 5 (five) minutes as needed for chest pain. 04/13/14  Yes Pecola Lawless, MD  oxyCODONE (OXY IR/ROXICODONE) 5 MG immediate release tablet Take 1-2 tablets (5-10 mg total) by mouth every 4 (four) hours as needed for breakthrough pain ((for MODERATE breakthrough pain)). 12/25/15  Yes Leroy Sea, MD  pantoprazole (PROTONIX) 40 MG tablet Take 1 tablet (40 mg total) by mouth daily. 12/25/15  Yes Leroy Sea, MD  vitamin E 400 UNIT capsule Take 800 Units by mouth daily.   Yes Historical Provider, MD  morphine (MS CONTIN) 30 MG 12 hr tablet Take 1 tablet (30 mg total) by mouth every 12 (twelve) hours. 12/25/15   Leroy Sea, MD     Review of systems complete and found to be negative unless listed above in HPI     Physical exam Blood pressure 138/86, pulse 95, temperature 98.6 F (37 C), temperature source Oral, resp. rate 21, height  (1.702 m), weight 143 lb 4.8 oz (65 kg), SpO2 97 %. General: NAD Neck: No JVD, no thyromegaly or thyroid nodule.  Lungs: Clear to auscultation bilaterally with normal respiratory effort. CV: Nondisplaced PMI. Regular rate and rhythm, normal S1/S2, no S3/S4, no murmur.  No peripheral edema.  No carotid bruit.  Normal pedal pulses.  Abdomen: Soft, nontender, no hepatosplenomegaly, no distention.  Skin: Intact without lesions or rashes.  Neurologic: Alert and oriented x 3.  Psych: Normal affect. Extremities: No clubbing or cyanosis.  HEENT: Normal.   ECG: Most recent ECG reviewed.  Labs:   Lab Results  Component Value Date   WBC 9.1 12/30/2015   HGB 10.4* 12/30/2015   HCT 31.5* 12/30/2015   MCV 90.5 12/30/2015   PLT 204 12/30/2015  Recent Labs Lab 12/28/15 0301  12/30/15 0456  NA 136  < > 136  K 4.2  < > 3.3*  CL 102  < > 104  CO2 22  < > 20*  BUN 30*  < > 11  CREATININE 1.42*  < > 0.89  CALCIUM 8.8*  < > 8.5*  PROT 6.7  --   --    BILITOT 1.8*  --   --   ALKPHOS 62  --   --   ALT 20  --   --   AST 48*  --   --   GLUCOSE 118*  < > 99  < > = values in this interval not displayed. Lab Results  Component Value Date   CKTOTAL 186 03/18/2014   CKMB 13.7* 03/18/2014   TROPONINI 12.70* 12/30/2015    Lab Results  Component Value Date   CHOL 122 12/28/2015   CHOL 101 03/19/2014   CHOL 108 05/08/2011   Lab Results  Component Value Date   HDL 40* 12/28/2015   HDL 41 03/19/2014   HDL 38* 05/08/2011   Lab Results  Component Value Date   LDLCALC 66 12/28/2015   LDLCALC 52 03/19/2014   LDLCALC 63 05/08/2011   Lab Results  Component Value Date   TRIG 78 12/28/2015   TRIG 65 12/28/2015   TRIG 40 03/19/2014   Lab Results  Component Value Date   CHOLHDL 3.1 12/28/2015   CHOLHDL 2.5 03/19/2014   CHOLHDL 2.8 05/08/2011   No results found for: LDLDIRECT       Studies: Ir Ivc Filter Plmt / S&i /img Guid/mod Sed  12/28/2015  CLINICAL DATA:  DVT, respiratory insufficiency, presumed acute PE. Renal insufficiency. EXAM: INFERIOR VENACAVOGRAM IVC FILTER PLACEMENT UNDER FLUOROSCOPY FLUOROSCOPY TIME:  0.8 minutes, 27 uGym2 DAP TECHNIQUE: Patency of the right IJ vein was confirmed with ultrasound with image documentation. An appropriate skin site was determined. Skin site was marked, prepped with chlorhexidine, and draped using maximum barrier technique. The region was infiltrated locally with 1% lidocaine. Under real-time ultrasound guidance, the right IJ vein was accessed with a 21 gauge micropuncture needle; the needle tip within the vein was confirmed with ultrasound image documentation. The needle was exchanged over a 018 guidewire for a transitional dilator, which allow advancement of the [facility] wire into the IVC. A long 6 French vascular sheath was placed for inferior venacavography using CO2. This demonstrated no caval thrombus. Renal vein inflows were evident. The [facility] IVC filter was advanced through the sheath  and successfully deployed under fluoroscopy at the L3 level. Followup cavagram demonstrates stable filter position and no evident complication. The sheath was removed and hemostasis achieved at the site. No immediate complication. IMPRESSION: 1. Normal IVC. No thrombus or significant anatomic variation. 2. Technically successful infrarenal IVC filter placement. This is a retrievable model. Electronically Signed   By: Corlis Leak M.D.   On: 12/28/2015 15:34   Dg Chest Port 1 View  12/30/2015  CLINICAL DATA:  Acute respiratory failure with hypoxia. EXAM: PORTABLE CHEST 1 VIEW COMPARISON:  12/29/2015 FINDINGS: Endotracheal tube and nasogastric tube have been removed. Previously seen left lower lobe opacity has nearly completely resolved. No evidence of pneumothorax or pleural effusion. Heart size is normal. IMPRESSION: Near complete resolution of left lower lobe opacity since prior study. No new findings. Electronically Signed   By: Myles Rosenthal M.D.   On: 12/30/2015 08:05   Dg Chest Port 1 View  12/29/2015  CLINICAL DATA:  Respiratory insufficiency EXAM: PORTABLE CHEST 1 VIEW COMPARISON:  12/28/2015 FINDINGS: Endotracheal tube is in place with tip approximately 5 cm above the carina. Nasogastric tube is in place with tip at least to the level of the gastric fundus but off the image. Heart size is normal. There is patchy density in the medial left lung base consistent with atelectasis or infiltrate. IMPRESSION: Persistent left lower lobe atelectasis or infiltrate. Electronically Signed   By: Norva Pavlov M.D.   On: 12/29/2015 08:01    ASSESSMENT AND PLAN:  1. Acute infero-posterior MI: Has refuses CABG over the years, and refused cath in 2015 at time of NSTEMI as noted above. As stated above, patient refuses all cardiac procedures and wants to be DNR/DNI code status. Currently on ASA, heparin, statin, and Plavix, but with considerable hemoptysis. Would try stopping Plavix. Will need heparin for DVT/PE. Can  reintroduce beta blockers and nitrates for symptom management. Currently denies chest pain.  2. PE/DVT: Continue IV heparin. Significant hemoptysis.  3. Severe aortic stenosis: Has refused AVR over the years. Now refuses all cardiac procedures. Medical management only.  Dispo: Overall prognosis is poor. Would recommend a palliative care approach.  Time spent: 70 minutes.   Signed: Prentice Docker, M.D., F.A.C.C.  12/30/2015, 10:33 AM

## 2015-12-30 NOTE — Progress Notes (Signed)
PULMONARY / CRITICAL CARE MEDICINE Progress Note   Name: Maxwell Copeland MRN: [id number] DOB: [birth year]    ADMISSION DATE:  12/27/2015 CONSULTATION DATE:  12/27/15  REFERRING MD:  EDP  CHIEF COMPLAINT:  SOB  HISTORY OF PRESENT ILLNESS:  Pt is encephelopathic; therefore, this HPI is obtained from chart review. Maxwell Copeland is a 82 y.o. male with PMH as outlined below including recent left non-displaced intertrochanteric femur fx for which he was recently hospitalized 12/22/15 through 12/25/15.  Due to his extensive cardiac hx including CAD and severe aortic stenosis, he was deemed a high risk surgical candidate.  After discussions with family, decision was made to defer surgery and manage the fracture conservatively.  He was seen in consultation by Dr. Charlann Boxer of orthopedics who instructed him to be NWB and to follow up with ortho as an outpatient 3 - 4 weeks post discharge.  Readmitted for hypoxia on 04/20 and empiric trx started for possible PE.    SUBJECTIVE:  RN reports significant hemoptysis since last night.   Patient denies CP or dyspnea this AM.   He does have left leg pain at fracture site. Hungry. Ready to go but willing to stay for full eval.    VITAL SIGNS: BP 116/58 mmHg  Pulse 109  Temp(Src) 98.3 F (36.8 C) (Oral)  Resp 15  Ht  (1.702 m)  Wt 144 lb 13.5 oz (65.7 kg)  BMI 22.68 kg/m2  SpO2 99%  HEMODYNAMICS:   VENTILATOR SETTINGS: N/A  INTAKE / OUTPUT: I/O last 3 completed shifts: In: 1217.4 [I.V.:1177.4; NG/GT:40] Out: 975 [Urine:975]   PHYSICAL EXAMINATION: General: Elderly male, chronically ill appearing, in NAD, watching tv and talking with family in room, intermittently suctioning blood tinged sputum Neuro:  AAO, following commands, responding appropriately HEENT: [place]/AT Cardiovascular: RRR, 4/6 SEM  Lungs: CTA, no w/r/r Abdomen: soft, NT/ND Ext:  No LE edema, left leg in brace  Skin: Intact, warm, no  rashes.  LABS:  BMET  Recent Labs Lab 12/24/15 0426 12/27/15 1900 12/28/15 0301  NA 139 137 136  K 4.0 4.9 4.2  CL 105 101 102  CO2 BUN 16 33* 30*  CREATININE 1.29* 1.51* 1.42*  GLUCOSE 99 124* 118*    Electrolytes  Recent Labs Lab 12/24/15 0426 12/27/15 1900 12/28/15 0301  CALCIUM 9.1 9.8 8.8*  MG  --   --  2.3  PHOS  --   --  3.6    CBC  Recent Labs Lab 12/24/15 0426 12/27/15 1900 12/28/15 0301  WBC 8.7 9.9 10.0  HGB 11.8* 12.8* 11.6*  HCT 35.9* 37.8* 34.4*  PLT 132* 177 187    Coag's  Recent Labs Lab 12/23/15 1030 12/28/15 0040  APTT  --  141*  INR 1.34 1.38    Sepsis Markers  Recent Labs Lab 12/27/15 1921  LATICACIDVEN 1.48    ABG  Recent Labs Lab 12/27/15 2207 12/28/15 0053  PHART 7.414 7.393  PCO2ART 38.9 42.3  PO2ART 45.0* 323.0*    Liver Enzymes  Recent Labs Lab 12/22/15 2123 12/27/15 1900 12/28/15 0301  AST 30 40 48*  ALT 16* 22 20  ALKPHOS 91 72 62  BILITOT 0.7 1.1 1.8*  ALBUMIN 3.9 3.3* 3.0*    Cardiac Enzymes  Recent Labs Lab 12/28/15 0301  TROPONINI 0.71*    Glucose  Recent Labs Lab 12/28/15 0103  GLUCAP 122*    Imaging Dg Chest Portable 1 View  12/27/2015  CLINICAL DATA:  Post intubation.  EXAM: PORTABLE CHEST 1 VIEW COMPARISON:  Earlier this day at 1918 hour FINDINGS: Endotracheal tube 4.9 cm from the carina. There is increased bibasilar atelectasis from prior, left greater than right. Heart is normal in size. No pulmonary edema large pleural effusion or pneumothorax. IMPRESSION: Endotracheal tube 4.9 cm from the carina. Increasing bibasilar atelectasis. Electronically Signed   By: Rubye Oaks M.D.   On: 12/27/2015 23:29   Dg Chest Portable 1 View  12/27/2015  CLINICAL DATA:  Shortness of breath, irregular heartbeat, congestion, hypertension, stroke, former smoker EXAM: PORTABLE CHEST 1 VIEW COMPARISON:  Portable exam 1918 hours compared to 12/23/2015 FINDINGS: Normal heart size  with note of a coronary arterial stent. Atherosclerotic calcification aorta. Mediastinal contours and pulmonary vascularity normal. Bibasilar atelectasis. Lungs otherwise clear. No pleural effusion or pneumothorax. Bones demineralized. IMPRESSION: Bibasilar atelectasis. Electronically Signed   By: Ulyses Southward M.D.   On: 12/27/2015 19:25     STUDIES:  CXR 04/20 > bibasilar atx. LE duplex 04/21 > acute left femoral DVT, mobile Echo 04/21 > EF 60-65%, grade 1 DD, mod-severe AS, no RV strain IVC filter placed 04/21  CULTURES: None.  ANTIBIOTICS: None.  SIGNIFICANT EVENTS: 12/22/15 through 12/25/15 > admitted after mechanical fall at home where he sustained closed left hip fx.  Decision made to treat non-operatively. 12/27/15 > admitted with acute hypoxemic respiratory failure, presumed due to PE.  Required intubation in ED. 4/21 IVC filter placed  04/22 acute chest pain, ST depression V2-V6, trop elevation --> cards consulted  LINES/TUBES: ETT 04/20 > 04/22  DISCUSSION: 82 y.o. M with recent closed left femur fx.  Discharged to SNF then re-admitted just 2 days later with acute hypoxemic respiratory failure.  Admitted on 04/20 and started on heparin gtt for likely PE.  Left femoral mobile clot confirmed by duplex on 04/21 and IVC filter placed the same day.  ASSESSMENT / PLAN:  PULMONARY A: Acute hypoxemic respiratory failure - due to presumed PE. Presumed submassive PE - in the setting of recent femur fracture, NWB status and + clot in left femoral vein. Hx emphysema. P:   Will need transition to oral agent for A/C Consider Budesonide / Brovana in lieu of outpatient symbicort. Albuterol PRN. CXR in AM.  CARDIOVASCULAR A:  Hx HTN, HLD, CAD (reportely refused CABG in the past), severe aortic stenosis, AAA. Troponin leak in the setting of PE  Echo w/ EF 60-65%, Gr 1 DD , severe AV calficification , no sign of right sided strain, small pericardial effusion. Acute chest pain on  04/22, EKG with lateral ST depression and trop trending upward. P:  Monitor hemodynamics. Hydralazine PRN. Continue to hold outpatient amlodipine, nitro. Continue outpatient ASA, plavix, atorvastatin.  Would d/c plavix and continue ASA + oral A/C for long-term A/C is needed. Cont Heparin gtt and transition to oral agent when able. Cards consulted 04/22 given concern for ACS, awaiting recs, will continue hep gtt, ASA, plavix, statin for now. Consider resume home BB and imdur.  NEUROLOGIC A:    Acute metabolic encephalopathy - resolving Hx falls, depression. Risk for delirium P:   Monitor mental status Lights on/blinds open in day, mobilize when able (PT already consulted)   RENAL A:   Hypokalemia  CKD - renal function now better than baseline P:   Continue D5/0.5NS @ 50cc/hr for now, monitor UOP Monitor BMP, replete lytes prn  GASTROINTESTINAL A:   GI prophylaxis. Nutrition. P:   SUP: Pantoprazole. TF held yesterday.  Plan for SLP today -->  start diet if able, otherwise, resume TF   HEMATOLOGIC A:   Acute Left mobile DVT s/p IVC filter  Anemia, appears to be chronic, at baseline, possibly AoCD P: Heparin gtt for presumed PE CBC daily, heparin levels  INFECTIOUS A:   No evidence of infection. P:   Monitor clinically.  ENDOCRINE A:   Hypothyroidism.  TSH 21.9, free T4 0.79, free T3 1.3 - likely sick euthyroid in the setting of chronic hypothyroidism. P:   Continue outpatient synthroid IV for now, transition back to po when able. Repeat TFTs post discharge.  MUSCULOSKELETAL  A  Left Hip Fracture s/p surgery  P  Cont left leg brace.    Add pain med for leg pain.  Family updated: daughter at bedside  Interdisciplinary Family Meeting v Palliative Care Meeting:  Due by: 01/02/16.  Evelena PeatAlex Alsie Younes, DO IMTS PGY3 on PCCM Service 12/30/2015 7:14AM

## 2015-12-31 DIAGNOSIS — S72002D Fracture of unspecified part of neck of left femur, subsequent encounter for closed fracture with routine healing: Secondary | ICD-10-CM

## 2015-12-31 DIAGNOSIS — E039 Hypothyroidism, unspecified: Secondary | ICD-10-CM

## 2015-12-31 DIAGNOSIS — I2119 ST elevation (STEMI) myocardial infarction involving other coronary artery of inferior wall: Secondary | ICD-10-CM

## 2015-12-31 DIAGNOSIS — Z515 Encounter for palliative care: Secondary | ICD-10-CM

## 2015-12-31 DIAGNOSIS — Z7189 Other specified counseling: Secondary | ICD-10-CM

## 2015-12-31 DIAGNOSIS — S72009S Fracture of unspecified part of neck of unspecified femur, sequela: Secondary | ICD-10-CM

## 2015-12-31 LAB — CBC
HEMATOCRIT: 28.1 % — AB (ref 39.0–52.0)
Hemoglobin: 9.6 g/dL — ABNORMAL LOW (ref 13.0–17.0)
MCH: 30.5 pg (ref 26.0–34.0)
MCHC: 34.2 g/dL (ref 30.0–36.0)
MCV: 89.2 fL (ref 78.0–100.0)
Platelets: 207 10*3/uL (ref 150–400)
RBC: 3.15 MIL/uL — ABNORMAL LOW (ref 4.22–5.81)
RDW: 13.2 % (ref 11.5–15.5)
WBC: 8 10*3/uL (ref 4.0–10.5)

## 2015-12-31 LAB — GLUCOSE, CAPILLARY: GLUCOSE-CAPILLARY: 94 mg/dL (ref 65–99)

## 2015-12-31 LAB — MAGNESIUM: Magnesium: 1.9 mg/dL (ref 1.7–2.4)

## 2015-12-31 LAB — HEPARIN LEVEL (UNFRACTIONATED): HEPARIN UNFRACTIONATED: 0.17 [IU]/mL — AB (ref 0.30–0.70)

## 2015-12-31 LAB — PHOSPHORUS: PHOSPHORUS: 2.9 mg/dL (ref 2.5–4.6)

## 2015-12-31 MED ORDER — APIXABAN 5 MG PO TABS
5.0000 mg | ORAL_TABLET | Freq: Two times a day (BID) | ORAL | Status: DC
Start: 2016-01-01 — End: 2015-12-31

## 2015-12-31 MED ORDER — APIXABAN 5 MG PO TABS: 5.0000 mg | ORAL_TABLET | Freq: Two times a day (BID) | ORAL | Status: DC

## 2015-12-31 MED ORDER — APIXABAN 5 MG PO TABS
10.0000 mg | ORAL_TABLET | Freq: Two times a day (BID) | ORAL | Status: DC
  Administered 2016-01-01 – 2016-01-02 (×3): 10 mg via ORAL
  Filled 2015-12-31 (×3): qty 2

## 2015-12-31 MED ORDER — ZOLPIDEM TARTRATE 5 MG PO TABS
2.5000 mg | ORAL_TABLET | Freq: Once | ORAL | Status: AC
  Administered 2015-12-31: 2.5 mg via ORAL
  Filled 2015-12-31: qty 1

## 2015-12-31 MED ORDER — HEPARIN (PORCINE) IN NACL 100-0.45 UNIT/ML-% IJ SOLN
1000.0000 [IU]/h | INTRAMUSCULAR | Status: AC
  Administered 2015-12-31: 1000 [IU]/h via INTRAVENOUS
  Filled 2015-12-31 (×2): qty 250

## 2015-12-31 MED ORDER — LEVOTHYROXINE SODIUM 112 MCG PO TABS
112.0000 ug | ORAL_TABLET | Freq: Every day | ORAL | Status: DC
  Administered 2016-01-01 – 2016-01-02 (×2): 112 ug via ORAL
  Filled 2015-12-31 (×2): qty 1

## 2015-12-31 MED ORDER — ENSURE ENLIVE PO LIQD
237.0000 mL | Freq: Two times a day (BID) | ORAL | Status: DC
Start: 2016-01-01 — End: 2016-01-02
  Administered 2016-01-01 – 2016-01-02 (×3): 237 mL via ORAL

## 2015-12-31 NOTE — Clinical Social Work Placement (Signed)
   CLINICAL SOCIAL WORK PLACEMENT  NOTE  Date:  12/31/2015  Patient Details  Name: Maxwell ShipperRichard C Dobesh MRN: [id number] Date of Birth: 06/03/1932  Clinical Social Work is seeking post-discharge placement for this patient at the Skilled  Nursing Facility level of care (*CSW will initial, date and re-position this form in  chart as items are completed):  Yes   Patient/family provided with [facility] Clinical Social Work Department's list of facilities offering this level of care within the geographic area requested by the patient (or if unable, by the patient's family).  Yes   Patient/family informed of their freedom to choose among providers that offer the needed level of care, that participate in Medicare, Medicaid or managed care program needed by the patient, have an available bed and are willing to accept the patient.  Yes   Patient/family informed of [facility]'s ownership interest in [facility] and [facility], as well as of the fact that they are under no obligation to receive care at these facilities.  PASRR submitted to EDS on       PASRR number received on       Existing PASRR number confirmed on 12/31/15     FL2 transmitted to all facilities in geographic area requested by pt/family on 12/31/15     FL2 transmitted to all facilities within larger geographic area on       Patient informed that his/her managed care company has contracts with or will negotiate with certain facilities, including the following:            Patient/family informed of bed offers received.  Patient chooses bed at       Physician recommends and patient chooses bed at      Patient to be transferred to   on  .  Patient to be transferred to facility by       Patient family notified on   of transfer.  Name of family member notified:        PHYSICIAN Please sign FL2     Additional Comment:    _______________________________________________ Rod MaeVaughn, Ely Spragg S, LCSW 12/31/2015, 2:59  PM

## 2015-12-31 NOTE — Progress Notes (Addendum)
ANTICOAGULATION CONSULT NOTE - Initial Consult  Pharmacy Consult for heparin > Eliquis Indication: DVT  Allergies  Allergen Reactions  . Acyclovir And Related   . Prednisone Palpitations    See 02/07/14 he feels this caused chest pain    Patient Measurements: Height: 5\' 7"  (170.2 cm) Weight: 141 lb 14.7 oz (64.375 kg) IBW/kg (Calculated) : 66.1   Vital Signs: Temp: 98 F (36.7 C) (04/24 1307) Temp Source: Oral (04/24 1307) BP: 113/58 mmHg (04/24 1307) Pulse Rate: 83 (04/24 1307)  Labs:  Recent Labs  12/29/15 0322 12/29/15 1738 12/29/15 2213 12/30/15 0456 12/31/15 0549  HGB 9.9*  --   --  10.4* 9.6*  HCT 29.8*  --   --  31.5* 28.1*  PLT 165  --   --  204 207  HEPARINUNFRC 0.58  --   --  0.31  --   CREATININE 1.13  --   --  0.89  --   TROPONINI  --  0.49* 7.53* 12.70*  --     Estimated Creatinine Clearance: 59.3 mL/min (by C-G formula based on Cr of 0.89).   Medical History: Past Medical History  Diagnosis Date  . Coronary artery disease     LAD w/ Ca++, 100% RCA, in-astent stenosis Cx, high grade lesion Ramus  . Hypercholesteremia   . Dyspnea   . Hypothyroid   . CVA (cerebral infarction)   . Arthritis     hips, will give out  . Varicella   . Depression     never took any medication.  . Emphysema of lung (HCC)     smoking related.  . Hypertension   . Stroke [facility](HCC)     left hemiparesis-100% recovery  . AAA (abdominal aortic aneurysm) (HCC)     small  . Severe aortic stenosis 2012    Medications:  Prescriptions prior to admission  Medication Sig Dispense Refill Last Dose  . acetaminophen (TYLENOL) 325 MG tablet Take 2 tablets (650 mg total) by mouth every 4 (four) hours as needed for mild pain, fever or headache.   12/27/2015 at Unknown time  . amLODipine (NORVASC) 10 MG tablet Take 10 mg by mouth every morning.    12/27/2015 at Unknown time  . aspirin 325 MG tablet Take 325 mg by mouth daily.   12/27/2015 at Unknown time  . atorvastatin (LIPITOR) 80  MG tablet Take 80 mg by mouth every morning.   12/27/2015 at Unknown time  . baclofen (LIORESAL) 10 MG tablet Take 1 tablet (10 mg total) by mouth 3 (three) times daily as needed for muscle spasms. 30 each 0 prn  . budesonide-formoterol (SYMBICORT) 160-4.5 MCG/ACT inhaler Inhale 2 puffs into the lungs 2 (two) times daily as needed (for shortness of breath). Reported on 09/13/2015 1 Inhaler 11 prn at Unknown time  . clopidogrel (PLAVIX) 75 MG tablet Take 75 mg by mouth every morning.    12/27/2015 at Unknown time  . docusate sodium (COLACE) 100 MG capsule Take 1 capsule (100 mg total) by mouth 2 (two) times daily. 10 capsule 0 12/27/2015 at Unknown time  . isosorbide mononitrate (IMDUR) 30 MG 24 hr tablet Take 30 mg by mouth every morning.   unknown  . levothyroxine (SYNTHROID, LEVOTHROID) 112 MCG tablet Take 112 mcg by mouth daily before breakfast.    12/27/2015 at Unknown time  . metoprolol (LOPRESSOR) 50 MG tablet Take 0.5 tablets (25 mg total) by mouth 2 (two) times daily.   12/27/2015 at 0800  . niacin (SLO-NIACIN) 500  MG tablet Take 500 mg by mouth every morning.    12/27/2015 at Unknown time  . nitroGLYCERIN (NITROSTAT) 0.4 MG SL tablet Place 1 tablet (0.4 mg total) under the tongue every 5 (five) minutes as needed for chest pain. 25 tablet 3 prn  . oxyCODONE (OXY IR/ROXICODONE) 5 MG immediate release tablet Take 1-2 tablets (5-10 mg total) by mouth every 4 (four) hours as needed for breakthrough pain ((for MODERATE breakthrough pain)). 30 tablet 0 12/26/2015 at Unknown time  . pantoprazole (PROTONIX) 40 MG tablet Take 1 tablet (40 mg total) by mouth daily.   12/27/2015 at Unknown time  . vitamin E 400 UNIT capsule Take 800 Units by mouth daily.   prn  . morphine (MS CONTIN) 30 MG 12 hr tablet Take 1 tablet (30 mg total) by mouth every 12 (twelve) hours. 20 tablet 0 unknown    Assessment: 82 yo male s/p recent L hip fracture, admitted with hypoxia and confusion. Pt was started on a heparin gtt for a  femoral mobile DVT - this was discontinued yesterday as the patient was having hemoptysis (resolved). Now s/p IVC and heparin will be resumed with plan to transition to Eliquis in the morning. CBC stable.  Goal of Therapy:  Monitor platelets by anticoagulation protocol: Yes   Plan:  -Heparin at 1000 units/hr -Daily HL, CBC -Check evening heparin level  -Turn off heparin at 1000 tomorrow, begin Eliquis at the same time heparin is discontinued -Eliquis 10 mg po bid x 7 days then 5 mg po bid -Monitor for hemoptysis and other s/sx bleeding -Consider reducing dose of aspirin or discontinuing all together   Baldemar Friday 12/31/2015,1:54 PM

## 2015-12-31 NOTE — Consult Note (Signed)
Consultation Note Date: 12/31/2015   Patient Name: Maxwell Copeland  DOB: 1931/12/26  MRN: [id number]  Age / Sex: 82 y.o., male  PCP: Veryl Speak, [name] Referring Physician: Maretta Bees, MD  Reason for Consultation: Establishing goals of care    Clinical Assessment/Narrative:  82 year old gentleman with history of falls, recent hip fracture. Patient was deemed high risk for surgery due to underlying aortic stenosis and coronary artery disease. Patient has been admitted with DVT left lower extremity status post IVC filter placement. Hospitalization course thus far also complicated by acute hypoxic respiratory failure, status post ventilator dependent respiratory failure, extubated 4-22. Was being treated for presumed pulmonary embolism with blood thinners. Developed hemoptysis on 4-23. Developed non-ST segment elevation MI post extubation. Cardiology following, patient refusing cardiac intervention such as catheterizations and has done this in the past. CODE STATUS established as DO NOT RESUSCITATE. Palliative consulted for additional discussions. Patient seen briefly on 4-23, family meeting with patient, daughters June and another daughter at the bedside, daughter Alexia Freestone joined [place] by phone.   I introduced myself and also defined palliative medicine as medical care for people living with serious illness. Our focus is on providing relief from the symptoms and stress of a serious illness. The goal is to improve quality of life for both the patient and the family.  Patient states his wishes are to go fishing and to go on a vacation. Explained that we wish this were possible. He has a hip fracture, recent hemoptysis and also with Acute infero-posterior MI Currently on ASA, heparin, statin, and Plavix, BB and nitrates.   Patient does not have any more hemoptysis, he is eating 100% of his dysphagia 3 meals, he states his  LLE pain is well controlled. Hence, patient and family request resuming heparin for DVT/PE. Another life limiting illness is his severe aortic stenosis for which he has refused AVR over the years.    See below.   Contacts/Participants in Discussion: Primary Decision Maker:     Relationship to Patient   HCPOA: yes     SUMMARY OF RECOMMENDATIONS: DNR SNF rehab on D/C. Recommend palliative follow up over there. Daughter June would like to start discussions with CSW. Family states that he was d/c rather suddenly the last time, they would like advanced notice and be given a chance to investigate a few SNF rehab facilities prior to D/C this time.  Family is requesting PT and ST evaluations.  Patient and family request re starting Heparin drip.  Goals are palliative, but not "comfort only" at this time. Even though the patient has refused cardiac interventions in the past, and even though his hip is inoperable due to high cardiac risk, patient and his family would like for him to be as well as he can for as long as he can.   Thank you for the consult. We will continue to follow along.   Code Status/Advance Care Planning: DNR    Code Status Orders        Start     Ordered   12/30/15 1149  Do not attempt resuscitation (DNR)   Continuous    Question Answer Comment  In the event of cardiac or respiratory ARREST Do not call a "code blue"   In the event of cardiac or respiratory ARREST Do not perform Intubation, CPR, defibrillation or ACLS   In the event of cardiac or respiratory ARREST Use medication by any route, position, wound care, and other measures to relive pain and  suffering. May use oxygen, suction and manual treatment of airway obstruction as needed for comfort.      12/30/15 1148    Code Status History    Date Active Date Inactive Code Status Order ID Comments User Context   12/27/2015 11:00 PM 12/30/2015 11:48 AM Full Code [id number]  Rahul Faye Ramsay ED   12/27/2015  9:49 PM  12/27/2015 11:00 PM Full Code [id number]  Lorretta Harp, MD ED   12/23/2015  2:16 AM 12/25/2015  7:49 PM Full Code [id number]  [name], MD Inpatient   03/18/2014  3:03 PM 03/21/2014  3:08 PM Full Code [id number]  Darrol Jump, PA-C Inpatient      Other Directives:Advanced Directive  Symptom Management:    as above   Palliative Prophylaxis:   Bowel Regimen    Psycho-social/Spiritual:  Support System: Fair Desire for further Chaplaincy support:no Additional Recommendations: Caregiving  Support/Resources  Prognosis: Unable to determine but guarded due to underlying cardiac conditions and recent hip fracture.   Discharge Planning: Skilled Nursing Facility for rehab with Palliative care service follow-up   Chief Complaint/ Primary Diagnoses: Present on Admission:  . PVD . Hypothyroidism . HYPERCHOLESTEROLEMIA  IIA . Hip fracture (HCC) . Cerebral artery occlusion with cerebral infarction (HCC) . Coronary atherosclerosis . AAA (abdominal aortic aneurysm) without rupture (HCC) . Acute respiratory failure with hypoxia (HCC) . Acute encephalopathy . Hypoxia . UTI (lower urinary tract infection) . Acute hypoxemic respiratory failure (HCC)  I have reviewed the medical record, interviewed the patient and family, and examined the patient. The following aspects are pertinent.  Past Medical History  Diagnosis Date  . Coronary artery disease     LAD w/ Ca++, 100% RCA, in-astent stenosis Cx, high grade lesion Ramus  . Hypercholesteremia   . Dyspnea   . Hypothyroid   . CVA (cerebral infarction)   . Arthritis     hips, will give out  . Varicella   . Depression     never took any medication.  . Emphysema of lung (HCC)     smoking related.  . Hypertension   . Stroke [facility]     left hemiparesis-100% recovery  . AAA (abdominal aortic aneurysm) (HCC)     small  . Severe aortic stenosis 2012   Social History   Social History  . Marital Status: Widowed    Spouse Name: N/A  .  Number of Children: N/A  . Years of Education: N/A   Occupational History  . retired    Social History Main Topics  . Smoking status: Former Smoker    Quit date: 09/09/1987  . Smokeless tobacco: Current User  . Alcohol Use: No  . Drug Use: No  . Sexual Activity: Not Currently   Other Topics Concern  . None   Social History Narrative   From [place]. Lived [place] since 1960's. Married 47 years - widowed. 2 dtrs. Retired. Lives alone and independently but his dtr is close by.   Family History  Problem Relation Age of Onset  . Coronary artery disease    . Hyperlipidemia Mother   . Heart disease Mother     had artifical valve  . Heart disease Brother   . Hyperlipidemia Brother   . COPD Brother   . Heart disease Brother     CABG, valve replacement  . Alcohol abuse Brother   . Heart attack Mother   . Hypertension Mother    Scheduled Meds: . arformoterol  15 mcg Nebulization BID  .  aspirin  325 mg Per Tube Daily  . atorvastatin  80 mg Oral q1800  . budesonide (PULMICORT) nebulizer solution  0.5 mg Nebulization BID  . clopidogrel  75 mg Oral Daily  . docusate  100 mg Oral BID  . isosorbide mononitrate  30 mg Oral q morning - 10a  . levothyroxine  56 mcg Intravenous QAC breakfast  . metoprolol  25 mg Oral BID  . pantoprazole (PROTONIX) IV  40 mg Intravenous QHS  . sodium chloride flush  10 mL Intravenous Q12H   Continuous Infusions:  PRN Meds:.sodium chloride, albuterol, hydrALAZINE, ondansetron **OR** ondansetron (ZOFRAN) IV, oxyCODONE-acetaminophen, RESOURCE THICKENUP CLEAR Medications Prior to Admission:  Prior to Admission medications   Medication Sig Start Date End Date Taking? Authorizing Provider  acetaminophen (TYLENOL) 325 MG tablet Take 2 tablets (650 mg total) by mouth every 4 (four) hours as needed for mild pain, fever or headache. 03/21/14  Yes Eda Paschal Kilroy, PA-C  amLODipine (NORVASC) 10 MG tablet Take 10 mg by mouth every morning.    Yes Historical Provider,  MD  aspirin 325 MG tablet Take 325 mg by mouth daily.   Yes Historical Provider, MD  atorvastatin (LIPITOR) 80 MG tablet Take 80 mg by mouth every morning.   Yes Historical Provider, MD  baclofen (LIORESAL) 10 MG tablet Take 1 tablet (10 mg total) by mouth 3 (three) times daily as needed for muscle spasms. 12/25/15  Yes Leroy Sea, MD  budesonide-formoterol (SYMBICORT) 160-4.5 MCG/ACT inhaler Inhale 2 puffs into the lungs 2 (two) times daily as needed (for shortness of breath). Reported on 09/13/2015 09/13/15  Yes Renee A Kuneff, DO  clopidogrel (PLAVIX) 75 MG tablet Take 75 mg by mouth every morning.    Yes Historical Provider, MD  docusate sodium (COLACE) 100 MG capsule Take 1 capsule (100 mg total) by mouth 2 (two) times daily. 12/25/15  Yes Leroy Sea, MD  isosorbide mononitrate (IMDUR) 30 MG 24 hr tablet Take 30 mg by mouth every morning.   Yes Historical Provider, MD  levothyroxine (SYNTHROID, LEVOTHROID) 112 MCG tablet Take 112 mcg by mouth daily before breakfast.    Yes Historical Provider, MD  metoprolol (LOPRESSOR) 50 MG tablet Take 0.5 tablets (25 mg total) by mouth 2 (two) times daily. 03/21/14  Yes Luke K Kilroy, PA-C  niacin (SLO-NIACIN) 500 MG tablet Take 500 mg by mouth every morning.    Yes Historical Provider, MD  nitroGLYCERIN (NITROSTAT) 0.4 MG SL tablet Place 1 tablet (0.4 mg total) under the tongue every 5 (five) minutes as needed for chest pain. 04/13/14  Yes Pecola Lawless, MD  oxyCODONE (OXY IR/ROXICODONE) 5 MG immediate release tablet Take 1-2 tablets (5-10 mg total) by mouth every 4 (four) hours as needed for breakthrough pain ((for MODERATE breakthrough pain)). 12/25/15  Yes Leroy Sea, MD  pantoprazole (PROTONIX) 40 MG tablet Take 1 tablet (40 mg total) by mouth daily. 12/25/15  Yes Leroy Sea, MD  vitamin E 400 UNIT capsule Take 800 Units by mouth daily.   Yes Historical Provider, MD  morphine (MS CONTIN) 30 MG 12 hr tablet Take 1 tablet (30 mg total) by  mouth every 12 (twelve) hours. 12/25/15   Leroy Sea, MD   Allergies  Allergen Reactions  . Acyclovir And Related   . Prednisone Palpitations    See 02/07/14 he feels this caused chest pain    Review of Systems Denies hemoptysis.   Physical Exam General: Thin elderly male in no  acute distress. Alert and oriented x 3.    Lungs: Clear bilaterally  Heart: RRR with loud harsh systolic murmur.  Abdomen: Bowel sounds are present. Soft, non-tender.   Extremities: No lower extremity edema.     Vital Signs: BP 138/71 mmHg  Pulse 75  Temp(Src) 98 F (36.7 C) (Oral)  Resp 16  Ht 5\' 7"  (1.702 m)  Wt 64.375 kg (141 lb 14.7 oz)  BMI 22.22 kg/m2  SpO2 94%  SpO2: SpO2: 94 % O2 Device:SpO2: 94 % O2 Flow Rate: .O2 Flow Rate (L/min): 2 L/min  IO: Intake/output summary:  Intake/Output Summary (Last 24 hours) at 12/31/15 1211 Last data filed at 12/31/15 0604  Gross per 24 hour  Intake    143 ml  Output    700 ml  Net   -557 ml    LBM: Last BM Date:  (pt cant remember) Baseline Weight: Weight: 67.586 kg (149 lb) Most recent weight: Weight: 64.375 kg (141 lb 14.7 oz)      Palliative Assessment/Data:  Flowsheet Rows        Most Recent Value   Intake Tab    Referral Department  Hospitalist   Unit at Time of Referral  Med/Surg Unit   Palliative Care Primary Diagnosis  Cardiac   Palliative Care Type  New Palliative care   Reason for referral  Clarify Goals of Care   Date first seen by Palliative Care  12/31/15   Clinical Assessment    Palliative Performance Scale Score  30%   Pain Max last 24 hours  5   Pain Min Last 24 hours  4   Dyspnea Max Last 24 Hours  4   Dyspnea Min Last 24 hours  3   Psychosocial & Spiritual Assessment    Palliative Care Outcomes    Patient/Family meeting held?  Yes   Who was at the meeting?  patient, 3 daughters.    Palliative Care follow-up planned  Yes, Facility      Additional Data Reviewed:  CBC:    Component Value Date/Time   WBC 8.0  12/31/2015 0549   HGB 9.6* 12/31/2015 0549   HCT 28.1* 12/31/2015 0549   PLT 207 12/31/2015 0549   MCV 89.2 12/31/2015 0549   NEUTROABS 6.3 12/27/2015 1900   LYMPHSABS 2.0 12/27/2015 1900   MONOABS 1.0 12/27/2015 1900   EOSABS 0.5 12/27/2015 1900   BASOSABS 0.0 12/27/2015 1900   Comprehensive Metabolic Panel:    Component Value Date/Time   NA 136 12/30/2015 0456   K 3.3* 12/30/2015 0456   CL 104 12/30/2015 0456   CO2 20* 12/30/2015 0456   BUN 11 12/30/2015 0456   CREATININE 0.89 12/30/2015 0456   GLUCOSE 99 12/30/2015 0456   CALCIUM 8.5* 12/30/2015 0456   AST 48* 12/28/2015 0301   ALT 20 12/28/2015 0301   ALKPHOS 62 12/28/2015 0301   BILITOT 1.8* 12/28/2015 0301   PROT 6.7 12/28/2015 0301   ALBUMIN 3.0* 12/28/2015 0301     Time In:  11 Time Out:  12 Time Total:  60 Greater than 50%  of this time was spent counseling and coordinating care related to the above assessment and plan.  Signed by: Rosalin HawkingZeba Audray Rumore, MD [phone] Rosalin HawkingZeba Hatem Cull, MD  12/31/2015, 12:11 PM  Please contact Palliative Medicine Team phone at [phone] for questions and concerns.

## 2015-12-31 NOTE — Progress Notes (Signed)
Nutrition Follow-up  DOCUMENTATION CODES:   Severe malnutrition in context of chronic illness  INTERVENTION:   -Ensure Enlive po BID, each supplement provides 350 kcal and 20 grams of protein  NUTRITION DIAGNOSIS:   Malnutrition related to chronic illness (severe cardiac disease) as evidenced by severe depletion of muscle mass, severe depletion of body fat.  Ongoing  GOAL:   Patient will meet greater than or equal to 90% of their needs  Progressing  MONITOR:   PO intake, Supplement acceptance, Diet advancement, Labs, Weight trends, Skin, I & O's  REASON FOR ASSESSMENT:   Consult, Ventilator Enteral/tube feeding initiation and management  ASSESSMENT:   Pt with PMH including recent left non-displaced intertrochanteric femur fx for which he was recently hospitalized 12/22/15 through 12/25/15. Due to his extensive cardiac hx including CAD and severe aortic stenosis, he was deemed a high risk surgical candidate. After discussions with family, decision was made to defer surgery and manage the fracture conservatively. Admitted 4/20 wth hypoxia and PE.   Pt extubated on 12/29/15. Pt was transferred from ICU to surgical florr on 12/30/15.   SLP following; pt was advanced to a dysphagia 3 diet with thin liquids this afternoon. Meal completion 50%.   Palliative care team following. Plan is to focus on comfort, with palliative goals. Pt is not transitioning to comfort care.  Labs reviewed: K: 3.3, Phos: 1.8.   Diet Order:  DIET DYS 3 Room service appropriate?: Yes; Fluid consistency:: Thin  Skin:  Reviewed, no issues  Last BM:  PTA  Height:   Ht Readings from Last 1 Encounters:  12/27/15 5\' 7"  (1.702 m)    Weight:   Wt Readings from Last 1 Encounters:  12/31/15 141 lb 14.7 oz (64.375 kg)    Ideal Body Weight:  67.2 kg  BMI:  Body mass index is 22.22 kg/(m^2).  Estimated Nutritional Needs:   Kcal:  1600-1800  Protein:  75-90 grams  Fluid:  1.6-1.8  L  EDUCATION NEEDS:   No education needs identified at this time  Burr Soffer A. Mayford KnifeWilliams, RD, LDN, CDE Pager: [phone] After hours Pager: [phone]

## 2015-12-31 NOTE — Progress Notes (Signed)
Speech Language Pathology Treatment: Dysphagia  Patient Details Name: Maxwell ShipperRichard C Copeland MRN: [id number] DOB: 06/24/1932 Today's Date: 12/31/2015 Time: [id number] SLP Time Calculation (min) (ACUTE ONLY): 18 min  Assessment / Plan / Recommendation Clinical Impression  Pt consumed thin liquids by cup and straw with no overt s/s of aspiration. He took in a small amount of solid PO, politely declining further trials, although his daughter at bedside reports adequate mastication with current diet order. He does prefer softer foods, which are easier for him to chew. Recommend to continue Dys 3 diet but with advancement to thin liquids. Will f/u briefly for tolerance.   HPI HPI: Pt is an 82 y.o. male with pMH severe CAD and aortic stenosis (he declined CABG/AVR in the past, diastolic dysfunction on last echo in 2015, and prior CVA who presented to ED 4/16  after having a mechanical fall at home, sustained a nondisplaced L proximal femur fracture. Pt d/c'd 4/18 and returned 4/20 with AMS and hypoxia and was intubated, extubated 4/22. Bedside swallow eval ordered to assess swallow function post-extubation.      SLP Plan  Continue with current plan of care     Recommendations  Diet recommendations: Dysphagia 3 (mechanical soft);Thin liquid Liquids provided via: Cup;Straw Medication Administration: Whole meds with puree Supervision: Patient able to self feed;Full supervision/cueing for compensatory strategies Compensations: Slow rate;Small sips/bites Postural Changes and/or Swallow Maneuvers: Upright 30-60 min after meal;Seated upright 90 degrees             Oral Care Recommendations: Oral care BID Follow up Recommendations: 24 hour supervision/assistance Plan: Continue with current plan of care     GO               Maxwell Copeland, M.A. CCC-SLP [phone]  Maxwell Copeland, Maxwell Copeland 12/31/2015, 3:29 PM

## 2015-12-31 NOTE — Care Management Important Message (Signed)
Important Message  Patient Details  Name: Maxwell Copeland MRN: [id number] Date of Birth: 06/04/1932   Medicare Important Message Given:  Yes    Maxwell Copeland 12/31/2015, 1:24 PM

## 2015-12-31 NOTE — [place]2 (Signed)
[place] MEDICAID FL2 LEVEL OF CARE SCREENING TOOL     IDENTIFICATION  Patient Name: Maxwell ShipperRichard C Cerrato Birthdate: 08/04/1932 Sex: male Admission Date (Current Location): 12/27/2015  [facility] [place]   Facility and Address:  [facility], [address], [place], [place] [address]      Provider Number: [id number]  Attending Physician Name and Address:  Maretta BeesShanker M Ghimire, MD  Relative Name and Phone Number:       Current Level of Care: Hospital Recommended Level of Care: Skilled Nursing Facility Prior Approval Number:    Date Approved/Denied:   PASRR Number: [id number]  Discharge Plan: SNF    Current Diagnoses: Patient Active Problem List   Diagnosis Date Noted  . Encounter for palliative care   . Goals of care, counseling/discussion   . Acute pulmonary embolism (HCC) 12/29/2015  . Acute respiratory failure with hypoxia (HCC) 12/27/2015  . Acute encephalopathy 12/27/2015  . Hypoxia 12/27/2015  . UTI (lower urinary tract infection) 12/27/2015  . Acute hypoxemic respiratory failure (HCC) 12/27/2015  . Abdominal pain   . Elevated troponin   . CKD (chronic kidney disease)   . Closed left hip fracture (HCC) 12/23/2015  . Hip fracture (HCC) 12/23/2015  . Acute bronchitis 09/13/2015  . Non-compliant behavior 03/27/2014  . Malnutrition of moderate degree (HCC) 03/20/2014  . NSTEMI (non-ST elevated myocardial infarction) (HCC) 03/18/2014  . PNA (pneumonia), secondary to aspiration 03/18/2014  . Severe aortic stenosis   . LBP radiating to right leg 01/14/2013  . AAA (abdominal aortic aneurysm) without rupture (HCC) 01/14/2013  . Elevated PSA 01/14/2013  . Dizzy spells 01/30/2011  . AORTIC VALVE DISORDERS 08/13/2009  . PVD 08/13/2009  . CAROTID BRUIT 08/13/2009  . Hypothyroidism 02/22/2009  . HYPERCHOLESTEROLEMIA  IIA 02/22/2009  . Coronary atherosclerosis 02/22/2009  . Cerebral artery occlusion with cerebral infarction  (HCC) 02/22/2009  . DYSPNEA 02/22/2009    Orientation RESPIRATION BLADDER Height & Weight     Self, Time  O2 Continent Weight: 141 lb 14.7 oz (64.375 kg) Height:  5\' 7"  (170.2 cm)  BEHAVIORAL SYMPTOMS/MOOD NEUROLOGICAL BOWEL NUTRITION STATUS      Continent Diet (Please see discharge summary.)  AMBULATORY STATUS COMMUNICATION OF NEEDS Skin    (Awaiting PT evaluation.) Verbally Surgical wounds                       Personal Care Assistance Level of Assistance  Bathing, Feeding, Dressing Bathing Assistance: Maximum assistance Feeding assistance: Limited assistance Dressing Assistance: Maximum assistance     Functional Limitations Info             SPECIAL CARE FACTORS FREQUENCY  PT (By licensed PT), OT (By licensed OT)     PT Frequency: 5 OT Frequency: 5            Contractures      Additional Factors Info  Code Status, Allergies Code Status Info: DNR Allergies Info: Acyclovir And Related, Prednisone           Current Medications (12/31/2015):  This is the current hospital active medication list Current Facility-Administered Medications  Medication Dose Route Frequency Provider Last Rate Last Dose  . 0.9 %  sodium chloride infusion  250 mL Intravenous PRN Rahul P Desai, PA-C      . albuterol (PROVENTIL) (2.5 MG/3ML) 0.083% nebulizer solution 2.5 mg  2.5 mg Nebulization Q2H PRN Lorretta HarpXilin Niu, MD      . Melene Muller[START ON 01/01/2016] apixaban (  ELIQUIS) tablet 10 mg  10 mg Oral BID Darl Householder Masters, [facility]       Followed by  . [START ON 01/08/2016] apixaban (ELIQUIS) tablet 5 mg  5 mg Oral BID Darl Householder Masters, [facility]      . arformoterol (BROVANA) nebulizer solution 15 mcg  15 mcg Nebulization BID Rahul P Desai, PA-C   15 mcg at 12/31/15 0731  . aspirin tablet 325 mg  325 mg Per Tube Daily Rahul P Desai, PA-C   325 mg at 12/31/15 1006  . atorvastatin (LIPITOR) tablet 80 mg  80 mg Oral q1800 Yolanda Manges, DO   80 mg at 12/30/15 1847  . budesonide (PULMICORT) nebulizer solution 0.5  mg  0.5 mg Nebulization BID Rahul P Desai, PA-C   0.5 mg at 12/31/15 0731  . docusate (COLACE) 50 MG/5ML liquid 100 mg  100 mg Oral BID Alyson Reedy, MD   100 mg at 12/31/15 1006  . heparin ADULT infusion 100 units/mL (25000 units/250 mL)  1,000 Units/hr Intravenous Continuous Darl Householder Masters, RPH      . hydrALAZINE (APRESOLINE) injection 10-40 mg  10-40 mg Intravenous Q4H PRN Rahul P Desai, PA-C      . isosorbide mononitrate (IMDUR) 24 hr tablet 30 mg  30 mg Oral q morning - 10a Yolanda Manges, DO   30 mg at 12/31/15 1006  . [START ON 01/01/2016] levothyroxine (SYNTHROID, LEVOTHROID) tablet 112 mcg  112 mcg Oral QAC breakfast Shanker Levora Dredge, MD      . metoprolol tartrate (LOPRESSOR) tablet 25 mg  25 mg Oral BID Yolanda Manges, DO   25 mg at 12/31/15 1006  . ondansetron (ZOFRAN) tablet 4 mg  4 mg Oral Q6H PRN Lorretta Harp, MD       Or  . ondansetron [facility]) injection 4 mg  4 mg Intravenous Q6H PRN Lorretta Harp, MD      . oxyCODONE-acetaminophen (PERCOCET/ROXICET) 5-325 MG per tablet 1-2 tablet  1-2 tablet Oral Q8H PRN Yolanda Manges, DO   1 tablet at 12/31/15 [phone]  . pantoprazole (PROTONIX) injection 40 mg  40 mg Intravenous QHS Rahul P Desai, PA-C   40 mg at 12/30/15 2113  . RESOURCE THICKENUP CLEAR   Oral PRN Kalman Shan, MD      . sodium chloride flush (NS) 0.9 % injection 10 mL  10 mL Intravenous Q12H Alyson Reedy, MD   10 mL at 12/31/15 1000     Discharge Medications: Please see discharge summary for a list of discharge medications.  Relevant Imaging Results:  Relevant Lab Results:   Additional Information SSN: [id number]  Rod Mae, LCSW

## 2015-12-31 NOTE — Progress Notes (Signed)
OT Cancellation Note  Patient Details Name: Maxwell Copeland MRN: [id number] DOB: 06/27/1932   Cancelled Treatment:    Reason Eval/Treat Not Completed: Medical issues which prohibited therapy. ECG confirms acute MI 4/22. Given change in medical status after initial OT eval entered, will need new order to proceed with mobility assessment -- MD please place activity order for OT eval if indicated. Thank you.  Edwin CapPatricia Heston Widener , MS, OTR/L, CLT  Pager: [phone]  12/31/2015, 8:46 AM

## 2015-12-31 NOTE — Progress Notes (Signed)
ANTICOAGULATION CONSULT NOTE - Initial Consult  Pharmacy Consult for heparin > Eliquis Indication: DVT  Allergies  Allergen Reactions  . Acyclovir And Related   . Prednisone Palpitations    See 02/07/14 he feels this caused chest pain    Patient Measurements: Height: 5\' 7"  (170.2 cm) Weight: 141 lb 14.7 oz (64.375 kg) IBW/kg (Calculated) : 66.1   Vital Signs: Temp: 97.9 F (36.6 C) (04/24 2117) Temp Source: Oral (04/24 2117) BP: 123/72 mmHg (04/24 2117) Pulse Rate: 84 (04/24 2117)  Labs:  Recent Labs  12/29/15 0322 12/29/15 1738 12/29/15 2213 12/30/15 0456 12/31/15 0549 12/31/15 2131  HGB 9.9*  --   --  10.4* 9.6*  --   HCT 29.8*  --   --  31.5* 28.1*  --   PLT 165  --   --  204 207  --   HEPARINUNFRC 0.58  --   --  0.31  --  0.17*  CREATININE 1.13  --   --  0.89  --   --   TROPONINI  --  0.49* 7.53* 12.70*  --   --     Estimated Creatinine Clearance: 59.3 mL/min (by C-G formula based on Cr of 0.89).   Medical History: Past Medical History  Diagnosis Date  . Coronary artery disease     LAD w/ Ca++, 100% RCA, in-astent stenosis Cx, high grade lesion Ramus  . Hypercholesteremia   . Dyspnea   . Hypothyroid   . CVA (cerebral infarction)   . Arthritis     hips, will give out  . Varicella   . Depression     never took any medication.  . Emphysema of lung (HCC)     smoking related.  . Hypertension   . Stroke [facility](HCC)     left hemiparesis-100% recovery  . AAA (abdominal aortic aneurysm) (HCC)     small  . Severe aortic stenosis 2012    Medications:  Prescriptions prior to admission  Medication Sig Dispense Refill Last Dose  . acetaminophen (TYLENOL) 325 MG tablet Take 2 tablets (650 mg total) by mouth every 4 (four) hours as needed for mild pain, fever or headache.   12/27/2015 at Unknown time  . amLODipine (NORVASC) 10 MG tablet Take 10 mg by mouth every morning.    12/27/2015 at Unknown time  . aspirin 325 MG tablet Take 325 mg by mouth daily.   12/27/2015  at Unknown time  . atorvastatin (LIPITOR) 80 MG tablet Take 80 mg by mouth every morning.   12/27/2015 at Unknown time  . baclofen (LIORESAL) 10 MG tablet Take 1 tablet (10 mg total) by mouth 3 (three) times daily as needed for muscle spasms. 30 each 0 prn  . budesonide-formoterol (SYMBICORT) 160-4.5 MCG/ACT inhaler Inhale 2 puffs into the lungs 2 (two) times daily as needed (for shortness of breath). Reported on 09/13/2015 1 Inhaler 11 prn at Unknown time  . clopidogrel (PLAVIX) 75 MG tablet Take 75 mg by mouth every morning.    12/27/2015 at Unknown time  . docusate sodium (COLACE) 100 MG capsule Take 1 capsule (100 mg total) by mouth 2 (two) times daily. 10 capsule 0 12/27/2015 at Unknown time  . isosorbide mononitrate (IMDUR) 30 MG 24 hr tablet Take 30 mg by mouth every morning.   unknown  . levothyroxine (SYNTHROID, LEVOTHROID) 112 MCG tablet Take 112 mcg by mouth daily before breakfast.    12/27/2015 at Unknown time  . metoprolol (LOPRESSOR) 50 MG tablet Take 0.5 tablets (25  mg total) by mouth 2 (two) times daily.   12/27/2015 at 0800  . niacin (SLO-NIACIN) 500 MG tablet Take 500 mg by mouth every morning.    12/27/2015 at Unknown time  . nitroGLYCERIN (NITROSTAT) 0.4 MG SL tablet Place 1 tablet (0.4 mg total) under the tongue every 5 (five) minutes as needed for chest pain. 25 tablet 3 prn  . oxyCODONE (OXY IR/ROXICODONE) 5 MG immediate release tablet Take 1-2 tablets (5-10 mg total) by mouth every 4 (four) hours as needed for breakthrough pain ((for MODERATE breakthrough pain)). 30 tablet 0 12/26/2015 at Unknown time  . pantoprazole (PROTONIX) 40 MG tablet Take 1 tablet (40 mg total) by mouth daily.   12/27/2015 at Unknown time  . vitamin E 400 UNIT capsule Take 800 Units by mouth daily.   prn  . morphine (MS CONTIN) 30 MG 12 hr tablet Take 1 tablet (30 mg total) by mouth every 12 (twelve) hours. 20 tablet 0 unknown    Assessment: 82 yo male s/p recent L hip fracture, admitted with hypoxia and  confusion. Pt was started on a heparin gtt for a femoral mobile DVT - this was discontinued yesterday as the patient was having hemoptysis (resolved). Now s/p IVC and heparin resumed with plan to transition to Eliquis in the morning. CBC stable. HL drawn ~ 5 hours after heparin drip resumed with no bolus at 1000 units/hr is low at 0.17.  Level is not steady-state. Plan was for 8 hr HL but drip started later than planned. No bleeding reported.   Goal of Therapy:  Monitor platelets by anticoagulation protocol: Yes   Plan:  - continue Heparin at 1000 units/hr until 10 am 4/25 -Daily HL, CBC x 1 more day -Turn off heparin at 1000 tomorrow, begin Eliquis at the same time heparin is discontinued -Eliquis 10 mg po bid x 7 days then 5 mg po bid -Monitor for hemoptysis and other s/sx bleeding -Consider reducing dose of aspirin or discontinuing all together  Herby Abraham, Pharm.D. [phone] 12/31/2015 10:32 PM

## 2015-12-31 NOTE — Clinical Social Work Note (Signed)
Clinical Social Work Assessment  Patient Details  Name: Maxwell Copeland MRN: [id number] Date of Birth: 03/12/1932  Date of referral:  12/31/15               Reason for consult:  Discharge Planning, Facility Placement                Permission sought to share information with:  Facility Medical sales representativeContact Representative, Family Supports Permission granted to share information::     Name::     Maxwell Copeland  [facility]  Relationship::  Daughter  Contact Information:  [phone]  Housing/Transportation Living arrangements for the past 2 months:  Skilled Nursing Facility Source of Information:  Adult Children Patient Interpreter Needed:  None Criminal Activity/Legal Involvement Pertinent to Current Situation/Hospitalization:  No - Comment as needed Significant Relationships:  Adult Children Lives with:  Facility Resident Do you feel safe going back to the place where you live?  No Need for family participation in patient care:  Yes (Comment) (Patient's daughter active in patient's care.)  Care giving concerns:  Patient's daughter expressed concern for [name]. Patient's daughter requesting new SNF placement.   Social Worker assessment / plan:  CSW received referral stating patient admitted from SNF. CSW spoke with patient's daughter, Maxwell, regarding patient's discharge disposition. Per patient's daughter, [facility] but family would prefer for patient to be placed at another SNF. CSW to complete new SNF referral. CSW to continue to follow and assist with discharge planning needs.  Employment status:  Retired Health and safety inspectornsurance information:  [place] Medicare (Blue Medicare) PT Recommendations:  Skilled Nursing Facility Information / Referral to community resources:  Skilled Nursing Facility  Patient/Family's Response to care:  Patient's family understanding and agreeable to CSW plan of care.  Patient/Family's Understanding of and  Emotional Response to Diagnosis, Current Treatment, and Prognosis:  Patient's family understanding and agreeable to CSW plan of care.  Emotional Assessment Appearance:   (CSW spoke with patient's daughter.) Attitude/Demeanor/Rapport:   (CSW spoke with patient's daughter.) Affect (typically observed):   (CSW spoke with patient's daughter.) Orientation:  Oriented to Self, Oriented to  Time Alcohol / Substance use:  Not Applicable Psych involvement (Current and /or in the community):  No (Comment) (Not appropriate on this admission.)  Discharge Needs  Concerns to be addressed:  No discharge needs identified Readmission within the last 30 days:  No Current discharge risk:  None Barriers to Discharge:  No Barriers Identified   Rod MaeVaughn, Yianna Tersigni S, LCSW 12/31/2015, 2:53 PM [phone]

## 2015-12-31 NOTE — Progress Notes (Signed)
     SUBJECTIVE: No pain. No dyspnea.   BP 138/71 mmHg  Pulse 75  Temp(Src) 98 F (36.7 C) (Oral)  Resp 16  Ht 5\' 7"  (1.702 m)  Wt 141 lb 14.7 oz (64.375 kg)  BMI 22.22 kg/m2  SpO2 94%  Intake/Output Summary (Last 24 hours) at 12/31/15 0956 Last data filed at 12/31/15 0604  Gross per 24 hour  Intake    339 ml  Output    875 ml  Net   -536 ml    PHYSICAL EXAM General: Thin elderly male in no acute distress. Alert and oriented x 3.  Psych:  Good affect, responds appropriately Neck: No JVD. No masses noted.  Lungs: Clear bilaterally with no wheezes or rhonci noted.  Heart: RRR with loud harsh systolic murmur.  Abdomen: Bowel sounds are present. Soft, non-tender.  Extremities: No lower extremity edema.   LABS: Basic Metabolic Panel:  Recent Labs  21/30/8604/22/17 0322  12/30/15 0456 12/31/15 0549  NA 137  --  136  --   K 3.5  --  3.3*  --   CL 106  --  104  --   CO2 22  --  20*  --   GLUCOSE 121*  --  99  --   BUN 25*  --  11  --   CREATININE 1.13  --  0.89  --   CALCIUM 8.4*  --  8.5*  --   MG  --   < > 1.9 1.9  PHOS  --   < > 1.8* 2.9  < > = values in this interval not displayed. CBC:  Recent Labs  12/30/15 0456 12/31/15 0549  WBC 9.1 8.0  HGB 10.4* 9.6*  HCT 31.5* 28.1*  MCV 90.5 89.2  PLT 204 207   Cardiac Enzymes:  Recent Labs  12/29/15 1738 12/29/15 2213 12/30/15 0456  TROPONINI 0.49* 7.53* 12.70*   Current Meds: . arformoterol  15 mcg Nebulization BID  . aspirin  325 mg Per Tube Daily  . atorvastatin  80 mg Oral q1800  . budesonide (PULMICORT) nebulizer solution  0.5 mg Nebulization BID  . clopidogrel  75 mg Oral Daily  . docusate  100 mg Oral BID  . isosorbide mononitrate  30 mg Oral q morning - 10a  . levothyroxine  56 mcg Intravenous QAC breakfast  . metoprolol  25 mg Oral BID  . pantoprazole (PROTONIX) IV  40 mg Intravenous QHS  . sodium chloride flush  10 mL Intravenous Q12H    ASSESSMENT AND PLAN:  1. Acute infero-posterior MI:  Has refused CABG over the years, and refused cath in 2015 at time of NSTEMI. At this time, he is refusing any cardiac procedures. He is DNR/DNI code status. Currently on ASA, heparin, statin, and Plavix. Will need heparin for DVT/PE. Continue beta blockers and nitrates for symptom management. Currently denies chest pain.  2. PE/DVT: Heparin stopped due to hemoptysis which has now resolved. Primary team to decide if he should be restarted on heparin. If he is started on a long term anti-coagulant, would d/c ASA.   3. Severe aortic stenosis: Has refused AVR over the years. Now refuses all cardiac procedures. Medical management only.  He is now DNR with plans for palliation. Will sign off. Please call with questions.     Christinea Brizuela  4/24/20179:56 AM

## 2015-12-31 NOTE — Progress Notes (Signed)
PROGRESS NOTE        PATIENT DETAILS Name: Maxwell Copeland Age: 82 y.o. Sex: male Date of Birth: November 20, 1931 Admit Date: 12/27/2015 Admitting Physician Alyson Reedy, MD ZOX:[id number], Maxwell Lites, [name] Outpatient Specialists:Dr. Eden Emms  Brief Narrative: Patient is a 82 y.o. male with hx of CAD and of severe Aortic stenosis-(refused CABG/AVR) with recent fracture proximal intertrochanteric left femur fracture for which he was deemed a high risk surgical candidate and decided to pursue non operative treatment. He was readmitted on 4/20 with persistent hypoxia requiring intubation.He was thought to have submassive pulmonary embolism and started on IV heparin. CT angiogram chest was deferred due to renal function.Left femoral mobile clot confirmed by duplex on 04/21 and IVC filter placed the same day.He was subsequently extubated on 4/22. Post extubation, patient developed acute posterior inferior MI.Further hospital course was complicated hemoptysis while on IV heparin. He was subsequently transferred [facility] on 4/24.  Subjective: No hemoptysis. No chest pain or SOB. Wants to get out of bed!  Assessment/Plan: Principal Problem:  Acute respiratory failure with hypoxia:resolved required intubation on admission, extubated on 4/22.Etiology felt to be secondary to pulmonary embolism.IV heparin discontinued due to he resolved, will retry starting IV heparin without bolus today,and plan on transitioning to Eliquis tomorrow.  Active Problems: Presumed subacute massive PE with acute mobile DVT in left femoral vein:  CT angiogram or VQ scan not done as likely will not change management-as patient had a renal failure on admission.Developed hemoptysis - and hence anticoagulation was temporarily disrupted.IVC Filter placed on 4/21. Since Hemoptysis has resolved-and family/patient desire to restart anticoagulation-we will restart IV heparin today-with plans to start  Eliquis tomorrow.Family aware of re-bleeding risk and accepting.  Hemoptysis:resolved.See above regarding anticoagulation.  Acute infero-posterior WU:[id number] chest pain on 4/23, EKG consistent with acute posterior inferior MI. Seen by cardiology, patient continues to refuse all cardiac procedures. Recommendations from cardiology are to continue aspirin if anticoagulation is being continued.Hence will d/c plavix. Continue statin and metoprolol. Currently without any chest pain.  GNF:AOZHYQMV with hydration and supportive care. Likely pre renal etiology.  Acute metabolic encephalopathy:Resolved.likely 2/2 to hypoxia, AKI.  Dyslipidemia:continue Statin  HQI:ONGEX hx of CAD-has refused LHC/CABG. Per cards-to stop Plavix if anticoagulation being restarted. Continue ASA, statin and metoprolol.  Severe aortic stenosis:has refused AVR in the past  BMW:[id number] Metoprolol and Imdur  Hx of COPD:lungs clear-continue bronchodilators  Anemia: appears to be chronic, close to usual baseline, possibly Anemia of chronic disease  Hypothyroidism:continue current dosing of synthroid-TSH mildly elevated-will defer further adjustment to outpatient. Could have sick euthyroid syndrome-will need to repeat TSH in a few weeks, if still increased-will require adjustment in dosing.   Recent proximal intertrochanteric left femur fracture:deemed a high risk surgical candidate and was decided to pursue non operative treatment. Reviewed last d/c summary-recommendations were to be no weightbearing in the left leg. Restarting anticoagulation today  GERD:continue PPI  DVT Prophylaxis: Full dose anticoagulation with Heparin  Code Status:  DNR  Family Communication: Daughter at bedside  Disposition Plan: Remain inpatient- SNF on 4/24-if remains without hemoptysis  Antimicrobial agents: None  Procedures: Intubated on 4/20, extubated on 4/22 LE duplex 04/21 > acute left femoral DVT, mobile Echo  04/21 > EF 60-65%, grade 1 DD, mod-severe AS, no RV strain IVC filter placed 04/21  CONSULTS:  cardiology   PCCM  Palliative  Time spent: 25 minutes-Greater than 50% of this time was spent in counseling, explanation of diagnosis, planning of further management, and coordination of care.  MEDICATIONS: Anti-infectives    Start     Dose/Rate Route Frequency Ordered Stop   12/27/15 2200  cefTRIAXone (ROCEPHIN) 1 g in dextrose 5 % 50 mL IVPB  Status:  Discontinued     1 g 100 mL/hr over 30 Minutes Intravenous Every 24 hours 12/27/15 2146 12/27/15 2300      Scheduled Meds: . arformoterol  15 mcg Nebulization BID  . aspirin  325 mg Per Tube Daily  . atorvastatin  80 mg Oral q1800  . budesonide (PULMICORT) nebulizer solution  0.5 mg Nebulization BID  . docusate  100 mg Oral BID  . isosorbide mononitrate  30 mg Oral q morning - 10a  . levothyroxine  56 mcg Intravenous QAC breakfast  . metoprolol  25 mg Oral BID  . pantoprazole (PROTONIX) IV  40 mg Intravenous QHS  . sodium chloride flush  10 mL Intravenous Q12H   Continuous Infusions:  PRN Meds:.sodium chloride, albuterol, hydrALAZINE, ondansetron **OR** ondansetron (ZOFRAN) IV, oxyCODONE-acetaminophen, RESOURCE THICKENUP CLEAR   PHYSICAL EXAM: Vital signs: Filed Vitals:   12/30/15 2125 12/31/15 0603 12/31/15 0731 12/31/15 0733  BP: 151/64 138/71    Pulse: 69 75    Temp: 98.3 F (36.8 C) 98 F (36.7 C)    TempSrc: Oral     Resp: 18 16    Height:      Weight:  64.375 kg (141 lb 14.7 oz)    SpO2: 96% 98% 94% 94%   Filed Weights   12/29/15 0430 12/30/15 0437 12/31/15 0603  Weight: 66.2 kg (145 lb 15.1 oz) 65 kg (143 lb 4.8 oz) 64.375 kg (141 lb 14.7 oz)   Body mass index is 22.22 kg/(m^2).   Gen Exam: Awake and alert with clear speech. Not in any distress  Neck: Supple, No JVD.   Chest: B/L Clear.   CVS: S1 S2 Regular, +syst murmur Abdomen: soft, BS +, non tender, non distended.  Extremities: no edema, lower  extremities warm to touch. Neurologic: Non Focal.   Skin: No Rash or lesions   Wounds: N/A.    LABORATORY DATA: CBC:  Recent Labs Lab 12/27/15 1900 12/28/15 0301 12/29/15 0322 12/30/15 0456 12/31/15 0549  WBC 9.9 10.0 8.2 9.1 8.0  NEUTROABS 6.3  --   --   --   --   HGB 12.8* 11.6* 9.9* 10.4* 9.6*  HCT 37.8* 34.4* 29.8* 31.5* 28.1*  MCV 90.9 91.2 91.1 90.5 89.2  PLT 177 187 165 204 207    Basic Metabolic Panel:  Recent Labs Lab 12/27/15 1900  12/28/15 0301 12/28/15 1206 12/28/15 2336 12/29/15 0322 12/29/15 1135 12/30/15 0456 12/31/15 0549  NA 137  --  136  --   --  137  --  136  --   K 4.9  --  4.2  --   --  3.5  --  3.3*  --   CL 101  --  102  --   --  106  --  104  --   CO2 24  --  22  --   --  22  --  20*  --   GLUCOSE 124*  --  118*  --   --  121*  --  99  --   BUN 33*  --  30*  --   --  25*  --  11  --  CREATININE 1.51*  --  1.42*  --   --  1.13  --  0.89  --   CALCIUM 9.8  --  8.8*  --   --  8.4*  --  8.5*  --   MG  --   < > 2.3 2.1 2.2  --  2.2 1.9 1.9  PHOS  --   < > 3.6 3.4 2.4*  --  2.0* 1.8* 2.9  < > = values in this interval not displayed.  GFR: Estimated Creatinine Clearance: 59.3 mL/min (by C-G formula based on Cr of 0.89).  Liver Function Tests:  Recent Labs Lab 12/27/15 1900 12/28/15 0301  AST 40 48*  ALT 22 20  ALKPHOS 72 62  BILITOT 1.1 1.8*  PROT 7.8 6.7  ALBUMIN 3.3* 3.0*    Recent Labs Lab 12/28/15 0040  LIPASE 22   No results for input(s): AMMONIA in the last 168 hours.  Coagulation Profile:  Recent Labs Lab 12/28/15 0040  INR 1.38    Cardiac Enzymes:  Recent Labs Lab 12/28/15 0301 12/28/15 1003 12/29/15 1738 12/29/15 2213 12/30/15 0456  TROPONINI 0.71* 0.78* 0.49* 7.53* 12.70*    BNP (last 3 results) No results for input(s): PROBNP in the last 8760 hours.  HbA1C: No results for input(s): HGBA1C in the last 72 hours.  CBG:  Recent Labs Lab 12/29/15 0752 12/29/15 1216 12/29/15 1605  12/30/15 1205 12/31/15 0712  GLUCAP 124* 107* 113* 110* 94    Lipid Profile: No results for input(s): CHOL, HDL, LDLCALC, TRIG, CHOLHDL, LDLDIRECT in the last 72 hours.  Thyroid Function Tests: No results for input(s): TSH, T4TOTAL, FREET4, T3FREE, THYROIDAB in the last 72 hours.  Anemia Panel: No results for input(s): VITAMINB12, FOLATE, FERRITIN, TIBC, IRON, RETICCTPCT in the last 72 hours.  Urine analysis:    Component Value Date/Time   COLORURINE AMBER* 12/27/2015 1927   APPEARANCEUR HAZY* 12/27/2015 1927   LABSPEC 1.030 12/27/2015 1927   PHURINE 5.5 12/27/2015 1927   GLUCOSEU NEGATIVE 12/27/2015 1927   HGBUR LARGE* 12/27/2015 1927   BILIRUBINUR SMALL* 12/27/2015 1927   BILIRUBINUR n 01/11/2013 1658   KETONESUR NEGATIVE 12/27/2015 1927   PROTEINUR 30* 12/27/2015 1927   PROTEINUR trace 01/11/2013 1658   UROBILINOGEN 1.0 03/18/2014 0709   UROBILINOGEN 0.2 01/11/2013 1658   NITRITE NEGATIVE 12/27/2015 1927   NITRITE n 01/11/2013 1658   LEUKOCYTESUR SMALL* 12/27/2015 1927    Sepsis Labs: Lactic Acid, Venous    Component Value Date/Time   LATICACIDVEN 1.48 12/27/2015 1921    MICROBIOLOGY: Recent Results (from the past 240 hour(s))  Urine culture     Status: Abnormal   Collection Time: 12/22/15 10:48 PM  Result Value Ref Range Status   Specimen Description URINE, RANDOM  Final   Special Requests NONE  Final   Culture 2,000 COLONIES/mL INSIGNIFICANT GROWTH (A)  Final   Report Status 12/24/2015 FINAL  Final  Culture, blood (Routine x 2)     Status: None (Preliminary result)   Collection Time: 12/27/15  5:00 PM  Result Value Ref Range Status   Specimen Description BLOOD LEFT ANTECUBITAL  Final   Special Requests BOTTLES DRAWN AEROBIC AND ANAEROBIC 5CC  Final   Culture NO GROWTH 3 DAYS  Final   Report Status PENDING  Incomplete  Urine culture     Status: Abnormal   Collection Time: 12/27/15  7:27 PM  Result Value Ref Range Status   Specimen Description URINE,  CATHETERIZED  Final   Special Requests NONE  Final   Culture >=100,000 COLONIES/mL KLEBSIELLA PNEUMONIAE (A)  Final   Report Status 12/30/2015 FINAL  Final   Organism ID, Bacteria KLEBSIELLA PNEUMONIAE (A)  Final      Susceptibility   Klebsiella pneumoniae - MIC*    AMPICILLIN 16 RESISTANT Resistant     CEFAZOLIN <=4 SENSITIVE Sensitive     CEFTRIAXONE <=1 SENSITIVE Sensitive     CIPROFLOXACIN <=0.25 SENSITIVE Sensitive     GENTAMICIN <=1 SENSITIVE Sensitive     IMIPENEM <=0.25 SENSITIVE Sensitive     NITROFURANTOIN 64 INTERMEDIATE Intermediate     TRIMETH/SULFA <=20 SENSITIVE Sensitive     AMPICILLIN/SULBACTAM 4 SENSITIVE Sensitive     PIP/TAZO <=4 SENSITIVE Sensitive     * >=100,000 COLONIES/mL KLEBSIELLA PNEUMONIAE  Culture, blood (Routine x 2)     Status: None (Preliminary result)   Collection Time: 12/27/15  7:35 PM  Result Value Ref Range Status   Specimen Description BLOOD RIGHT HAND  Final   Special Requests BOTTLES DRAWN AEROBIC AND ANAEROBIC 5CC  Final   Culture NO GROWTH 3 DAYS  Final   Report Status PENDING  Incomplete  MRSA PCR Screening     Status: None   Collection Time: 12/28/15 12:12 AM  Result Value Ref Range Status   MRSA by PCR NEGATIVE NEGATIVE Final    Comment:        The GeneXpert MRSA Assay (FDA approved for NASAL specimens only), is one component of a comprehensive MRSA colonization surveillance program. It is not intended to diagnose MRSA infection nor to guide or monitor treatment for MRSA infections.     RADIOLOGY STUDIES/RESULTS: Ir Ivc Filter Plmt / S&i /img Guid/mod Sed  12/28/2015  CLINICAL DATA:  DVT, respiratory insufficiency, presumed acute PE. Renal insufficiency. EXAM: INFERIOR VENACAVOGRAM IVC FILTER PLACEMENT UNDER FLUOROSCOPY FLUOROSCOPY TIME:  0.8 minutes, 27 uGym2 DAP TECHNIQUE: Patency of the right IJ vein was confirmed with ultrasound with image documentation. An appropriate skin site was determined. Skin site was marked, prepped  with chlorhexidine, and draped using maximum barrier technique. The region was infiltrated locally with 1% lidocaine. Under real-time ultrasound guidance, the right IJ vein was accessed with a 21 gauge micropuncture needle; the needle tip within the vein was confirmed with ultrasound image documentation. The needle was exchanged over a 018 guidewire for a transitional dilator, which allow advancement of the [facility] wire into the IVC. A long 6 French vascular sheath was placed for inferior venacavography using CO2. This demonstrated no caval thrombus. Renal vein inflows were evident. The [facility] IVC filter was advanced through the sheath and successfully deployed under fluoroscopy at the L3 level. Followup cavagram demonstrates stable filter position and no evident complication. The sheath was removed and hemostasis achieved at the site. No immediate complication. IMPRESSION: 1. Normal IVC. No thrombus or significant anatomic variation. 2. Technically successful infrarenal IVC filter placement. This is a retrievable model. Electronically Signed   By: Corlis Leak M.D.   On: 12/28/2015 15:34   Dg Chest Port 1 View  12/30/2015  CLINICAL DATA:  Acute respiratory failure with hypoxia. EXAM: PORTABLE CHEST 1 VIEW COMPARISON:  12/29/2015 FINDINGS: Endotracheal tube and nasogastric tube have been removed. Previously seen left lower lobe opacity has nearly completely resolved. No evidence of pneumothorax or pleural effusion. Heart size is normal. IMPRESSION: Near complete resolution of left lower lobe opacity since prior study. No new findings. Electronically Signed   By: Myles Rosenthal M.D.   On: 12/30/2015 08:05   Dg Chest [facility]  1 View  12/29/2015  CLINICAL DATA:  Respiratory insufficiency EXAM: PORTABLE CHEST 1 VIEW COMPARISON:  12/28/2015 FINDINGS: Endotracheal tube is in place with tip approximately 5 cm above the carina. Nasogastric tube is in place with tip at least to the level of the gastric fundus but off the image.  Heart size is normal. There is patchy density in the medial left lung base consistent with atelectasis or infiltrate. IMPRESSION: Persistent left lower lobe atelectasis or infiltrate. Electronically Signed   By: Norva Pavlov M.D.   On: 12/29/2015 08:01   Dg Chest Port 1 View  12/28/2015  CLINICAL DATA:  Hypoxia EXAM: PORTABLE CHEST 1 VIEW COMPARISON:  December 27, 2015 FINDINGS: Endotracheal tube tip is 4.6 cm above the carina. Nasogastric tube tip and side-port in the stomach. No pneumothorax. There is atelectasis in the left base. Lungs elsewhere clear. Heart size and pulmonary vascularity are normal. No adenopathy. There is atherosclerotic calcification in aorta. No bone lesions. IMPRESSION: Tube positions as described without pneumothorax. Left base region atelectasis. Lungs elsewhere clear. Stable cardiac silhouette. Electronically Signed   By: Bretta Bang III M.D.   On: 12/28/2015 07:27   Dg Chest Portable 1 View  12/27/2015  CLINICAL DATA:  Post intubation. EXAM: PORTABLE CHEST 1 VIEW COMPARISON:  Earlier this day at 1918 hour FINDINGS: Endotracheal tube 4.9 cm from the carina. There is increased bibasilar atelectasis from prior, left greater than right. Heart is normal in size. No pulmonary edema large pleural effusion or pneumothorax. IMPRESSION: Endotracheal tube 4.9 cm from the carina. Increasing bibasilar atelectasis. Electronically Signed   By: Rubye Oaks M.D.   On: 12/27/2015 23:29   Dg Chest Portable 1 View  12/27/2015  CLINICAL DATA:  Shortness of breath, irregular heartbeat, congestion, hypertension, stroke, former smoker EXAM: PORTABLE CHEST 1 VIEW COMPARISON:  Portable exam 1918 hours compared to 12/23/2015 FINDINGS: Normal heart size with note of a coronary arterial stent. Atherosclerotic calcification aorta. Mediastinal contours and pulmonary vascularity normal. Bibasilar atelectasis. Lungs otherwise clear. No pleural effusion or pneumothorax. Bones demineralized.  IMPRESSION: Bibasilar atelectasis. Electronically Signed   By: Ulyses Southward M.D.   On: 12/27/2015 19:25   Dg Chest Port 1 View  12/23/2015  CLINICAL DATA:  Pre op for right hip Fx surgery Hx of HTN, CVA, coronary artery disease, emphysema of lung, AAA EXAM: PORTABLE CHEST 1 VIEW COMPARISON:  09/13/2015 FINDINGS: Patient rotated to the left. Midline trachea. Cardiomegaly accentuated by AP portable technique. Atherosclerosis in the transverse aorta. No pleural effusion or pneumothorax. Left infrahilar airspace disease. Clear right lung. IMPRESSION: Oblique portable radiograph demonstrating left infrahilar airspace disease. This could represent mild scarring, accentuated by low lung volumes and obliquity. If there are cardiopulmonary symptoms, recommend preop PA and lateral radiographs. If not, this could be re-evaluated with nonemergent outpatient PA and lateral films. Cardiomegaly without congestive failure. These results will be called to the ordering clinician or representative by the Radiologist Assistant, and communication documented in the PACS or zVision Dashboard. Electronically Signed   By: Jeronimo Greaves M.D.   On: 12/23/2015 10:18   Dg Hip Unilat With Pelvis 2-3 Views Left  12/22/2015  CLINICAL DATA:  Fall with left hip pain EXAM: DG HIP (WITH OR WITHOUT PELVIS) 2-3V LEFT COMPARISON:  None. FINDINGS: Comminuted nondisplaced intertrochanteric left proximal femur fracture. No additional fracture. No dislocation or appreciable arthropathy in the left hip. Diffuse osteopenia. Degenerative changes in the visualized lower lumbar spine. Vascular calcifications throughout the soft tissues. Prominent stool throughout the  visualized colon. IMPRESSION: Comminuted nondisplaced intertrochanteric left proximal femur fracture. Diffuse osteopenia. Electronically Signed   By: Delbert Phenix M.D.   On: 12/22/2015 22:00     LOS: 4 days   Jeoffrey Massed, MD  [facility] Pager:[phone]  If 7PM-7AM, please  contact night-coverage [email or web address] Password [facility] 12/31/2015, 12:48 PM

## 2016-01-01 DIAGNOSIS — I214 Non-ST elevation (NSTEMI) myocardial infarction: Secondary | ICD-10-CM

## 2016-01-01 LAB — CULTURE, BLOOD (ROUTINE X 2)
CULTURE: NO GROWTH
Culture: NO GROWTH

## 2016-01-01 LAB — CBC
HCT: 29.6 % — ABNORMAL LOW (ref 39.0–52.0)
HEMOGLOBIN: 10 g/dL — AB (ref 13.0–17.0)
MCH: 29.7 pg (ref 26.0–34.0)
MCHC: 33.8 g/dL (ref 30.0–36.0)
MCV: 87.8 fL (ref 78.0–100.0)
PLATELETS: 254 10*3/uL (ref 150–400)
RBC: 3.37 MIL/uL — ABNORMAL LOW (ref 4.22–5.81)
RDW: 13 % (ref 11.5–15.5)
WBC: 9.2 10*3/uL (ref 4.0–10.5)

## 2016-01-01 LAB — GLUCOSE, CAPILLARY: Glucose-Capillary: 86 mg/dL (ref 65–99)

## 2016-01-01 LAB — HEPARIN LEVEL (UNFRACTIONATED): HEPARIN UNFRACTIONATED: 0.32 [IU]/mL (ref 0.30–0.70)

## 2016-01-01 MED ORDER — APIXABAN 5 MG PO TABS: 5.0000 mg | ORAL_TABLET | Freq: Two times a day (BID) | ORAL | Status: AC

## 2016-01-01 MED ORDER — ENSURE ENLIVE PO LIQD: 237.0000 mL | Freq: Two times a day (BID) | ORAL | Status: AC

## 2016-01-01 MED ORDER — APIXABAN 5 MG PO TABS: 10.0000 mg | ORAL_TABLET | Freq: Two times a day (BID) | ORAL | Status: AC

## 2016-01-01 MED ORDER — ASPIRIN 81 MG PO CHEW
81.0000 mg | CHEWABLE_TABLET | Freq: Every day | ORAL | Status: DC
  Administered 2016-01-01 – 2016-01-02 (×2): 81 mg via ORAL
  Filled 2016-01-01 (×2): qty 1

## 2016-01-01 MED ORDER — ASPIRIN 81 MG PO TABS: 81.0000 mg | ORAL_TABLET | Freq: Every day | ORAL | Status: AC

## 2016-01-01 MED ORDER — OXYCODONE HCL 5 MG PO TABS: 5.0000 mg | ORAL_TABLET | Freq: Four times a day (QID) | ORAL | Status: AC | PRN

## 2016-01-01 MED ORDER — PANTOPRAZOLE SODIUM 40 MG PO TBEC
40.0000 mg | DELAYED_RELEASE_TABLET | Freq: Every day | ORAL | Status: DC
  Administered 2016-01-01: 40 mg via ORAL
  Filled 2016-01-01: qty 1

## 2016-01-01 MED ORDER — OXYCODONE-ACETAMINOPHEN 5-325 MG PO TABS
1.0000 | ORAL_TABLET | ORAL | Status: DC | PRN
  Administered 2016-01-01 – 2016-01-02 (×3): 2 via ORAL
  Administered 2016-01-02: 1 via ORAL
  Filled 2016-01-01 (×4): qty 2

## 2016-01-01 MED ORDER — BUDESONIDE 0.5 MG/2ML IN SUSP: 0.5000 mg | Freq: Two times a day (BID) | RESPIRATORY_TRACT | Status: AC

## 2016-01-01 MED ORDER — ARFORMOTEROL TARTRATE 15 MCG/2ML IN NEBU: 15.0000 ug | INHALATION_SOLUTION | Freq: Two times a day (BID) | RESPIRATORY_TRACT | Status: AC

## 2016-01-01 MED ORDER — POLYETHYLENE GLYCOL 3350 17 G PO PACK: 17.0000 g | PACK | Freq: Every day | ORAL | Status: AC

## 2016-01-01 NOTE — Discharge Summary (Addendum)
PATIENT DETAILS Name: Maxwell Copeland Age: 82 y.o. Sex: male Date of Birth: Sep 20, 1931 MRN: [id number]. Admitting Physician: Alyson Reedy, MD [name], Maxwell Lites, [name]  Admit Date: 12/27/2015 Discharge date: 01/02/2016  Recommendations for Outpatient Follow-up:  1. Ensure palliative care follow up at SNF 2. Please repeat CBC/BMET in 1-2 weeeks  PRIMARY DISCHARGE DIAGNOSIS:  Principal Problem:   Acute respiratory failure with hypoxia (HCC) Active Problems:   Hypothyroidism   HYPERCHOLESTEROLEMIA  IIA   Coronary atherosclerosis   Cerebral artery occlusion with cerebral infarction (HCC)   PVD   AAA (abdominal aortic aneurysm) without rupture (HCC)   Severe aortic stenosis   Hip fracture (HCC)   Acute encephalopathy   Hypoxia   UTI (lower urinary tract infection)   Acute hypoxemic respiratory failure (HCC)   Acute pulmonary embolism (HCC)   Encounter for palliative care   Goals of care, counseling/discussion      PAST MEDICAL HISTORY: Past Medical History  Diagnosis Date  . Coronary artery disease     LAD w/ Ca++, 100% RCA, in-astent stenosis Cx, high grade lesion Ramus  . Hypercholesteremia   . Dyspnea   . Hypothyroid   . CVA (cerebral infarction)   . Arthritis     hips, will give out  . Varicella   . Depression     never took any medication.  . Emphysema of lung (HCC)     smoking related.  . Hypertension   . Stroke [facility])     left hemiparesis-100% recovery  . AAA (abdominal aortic aneurysm) (HCC)     small  . Severe aortic stenosis 2012    DISCHARGE MEDICATIONS: Current Discharge Medication List    START taking these medications   Details  !! apixaban (ELIQUIS) 5 MG TABS tablet Take 2 tablets (10 mg total) by mouth 2 (two) times daily. Starting Tue 01/01/16 at 1000, For 7 days    !! apixaban (ELIQUIS) 5 MG TABS tablet Take 1 tablet (5 mg total) by mouth 2 (two) times daily. START on 01/08/16    arformoterol (BROVANA) 15 MCG/2ML NEBU Take 2 mLs (15  mcg total) by nebulization 2 (two) times daily.    budesonide (PULMICORT) 0.5 MG/2ML nebulizer solution Take 2 mLs (0.5 mg total) by nebulization 2 (two) times daily.    feeding supplement, ENSURE ENLIVE, (ENSURE ENLIVE) LIQD Take 237 mLs by mouth 2 (two) times daily between meals.    polyethylene glycol (MIRALAX) packet Take 17 g by mouth daily.     !! - Potential duplicate medications found. Please discuss with provider.    CONTINUE these medications which have CHANGED   Details  aspirin 81 MG tablet Take 1 tablet (81 mg total) by mouth daily.    oxyCODONE (OXY IR/ROXICODONE) 5 MG immediate release tablet Take 1-2 tablets (5-10 mg total) by mouth every 6 (six) hours as needed for breakthrough pain ((for MODERATE breakthrough pain)). Qty: 30 tablet, Refills: 0      CONTINUE these medications which have NOT CHANGED   Details  acetaminophen (TYLENOL) 325 MG tablet Take 2 tablets (650 mg total) by mouth every 4 (four) hours as needed for mild pain, fever or headache.    atorvastatin (LIPITOR) 80 MG tablet Take 80 mg by mouth every morning.    baclofen (LIORESAL) 10 MG tablet Take 1 tablet (10 mg total) by mouth 3 (three) times daily as needed for muscle spasms. Qty: 30 each, Refills: 0    docusate sodium (COLACE) 100 MG capsule Take 1  capsule (100 mg total) by mouth 2 (two) times daily. Qty: 10 capsule, Refills: 0    isosorbide mononitrate (IMDUR) 30 MG 24 hr tablet Take 30 mg by mouth every morning.    levothyroxine (SYNTHROID, LEVOTHROID) 112 MCG tablet Take 112 mcg by mouth daily before breakfast.     metoprolol (LOPRESSOR) 50 MG tablet Take 0.5 tablets (25 mg total) by mouth 2 (two) times daily.    niacin (SLO-NIACIN) 500 MG tablet Take 500 mg by mouth every morning.     nitroGLYCERIN (NITROSTAT) 0.4 MG SL tablet Place 1 tablet (0.4 mg total) under the tongue every 5 (five) minutes as needed for chest pain. Qty: 25 tablet, Refills: 3   Associated Diagnoses: NSTEMI (non-ST  elevated myocardial infarction) (HCC)    pantoprazole (PROTONIX) 40 MG tablet Take 1 tablet (40 mg total) by mouth daily.    vitamin E 400 UNIT capsule Take 800 Units by mouth daily.      STOP taking these medications     amLODipine (NORVASC) 10 MG tablet      budesonide-formoterol (SYMBICORT) 160-4.5 MCG/ACT inhaler      clopidogrel (PLAVIX) 75 MG tablet      morphine (MS CONTIN) 30 MG 12 hr tablet         ALLERGIES:   Allergies  Allergen Reactions  . Acyclovir And Related   . Prednisone Palpitations    See 02/07/14 he feels this caused chest pain    BRIEF HPI:  See H&P, Labs, Consult and Test reports for all details in brief, Patient is a 82 y.o. male with hx of CAD and of severe Aortic stenosis-(refused CABG/AVR) with recent fracture proximal intertrochanteric left femur fracture for which he was deemed a high risk surgical candidate and decided to pursue non operative treatment. He was readmitted on 4/20 with persistent hypoxia requiring intubation.He was thought to have submassive pulmonary embolism and started on IV heparin. CT angiogram chest was deferred due to renal function  CONSULTATIONS:   cardiology, pulmonary/intensive care and Palliative care  PERTINENT RADIOLOGIC STUDIES: Ir Ivc Filter Plmt / S&i /img Guid/mod Sed  12/28/2015  CLINICAL DATA:  DVT, respiratory insufficiency, presumed acute PE. Renal insufficiency. EXAM: INFERIOR VENACAVOGRAM IVC FILTER PLACEMENT UNDER FLUOROSCOPY FLUOROSCOPY TIME:  0.8 minutes, 27 uGym2 DAP TECHNIQUE: Patency of the right IJ vein was confirmed with ultrasound with image documentation. An appropriate skin site was determined. Skin site was marked, prepped with chlorhexidine, and draped using maximum barrier technique. The region was infiltrated locally with 1% lidocaine. Under real-time ultrasound guidance, the right IJ vein was accessed with a 21 gauge micropuncture needle; the needle tip within the vein was confirmed with ultrasound  image documentation. The needle was exchanged over a 018 guidewire for a transitional dilator, which allow advancement of the [facility] wire into the IVC. A long 6 French vascular sheath was placed for inferior venacavography using CO2. This demonstrated no caval thrombus. Renal vein inflows were evident. The [facility] IVC filter was advanced through the sheath and successfully deployed under fluoroscopy at the L3 level. Followup cavagram demonstrates stable filter position and no evident complication. The sheath was removed and hemostasis achieved at the site. No immediate complication. IMPRESSION: 1. Normal IVC. No thrombus or significant anatomic variation. 2. Technically successful infrarenal IVC filter placement. This is a retrievable model. Electronically Signed   By: Corlis Leak M.D.   On: 12/28/2015 15:34   Dg Chest Port 1 View  12/30/2015  CLINICAL DATA:  Acute respiratory failure with  hypoxia. EXAM: PORTABLE CHEST 1 VIEW COMPARISON:  12/29/2015 FINDINGS: Endotracheal tube and nasogastric tube have been removed. Previously seen left lower lobe opacity has nearly completely resolved. No evidence of pneumothorax or pleural effusion. Heart size is normal. IMPRESSION: Near complete resolution of left lower lobe opacity since prior study. No new findings. Electronically Signed   By: Myles Rosenthal M.D.   On: 12/30/2015 08:05   Dg Chest Port 1 View  12/29/2015  CLINICAL DATA:  Respiratory insufficiency EXAM: PORTABLE CHEST 1 VIEW COMPARISON:  12/28/2015 FINDINGS: Endotracheal tube is in place with tip approximately 5 cm above the carina. Nasogastric tube is in place with tip at least to the level of the gastric fundus but off the image. Heart size is normal. There is patchy density in the medial left lung base consistent with atelectasis or infiltrate. IMPRESSION: Persistent left lower lobe atelectasis or infiltrate. Electronically Signed   By: Norva Pavlov M.D.   On: 12/29/2015 08:01   Dg Chest Port 1  View  12/28/2015  CLINICAL DATA:  Hypoxia EXAM: PORTABLE CHEST 1 VIEW COMPARISON:  December 27, 2015 FINDINGS: Endotracheal tube tip is 4.6 cm above the carina. Nasogastric tube tip and side-port in the stomach. No pneumothorax. There is atelectasis in the left base. Lungs elsewhere clear. Heart size and pulmonary vascularity are normal. No adenopathy. There is atherosclerotic calcification in aorta. No bone lesions. IMPRESSION: Tube positions as described without pneumothorax. Left base region atelectasis. Lungs elsewhere clear. Stable cardiac silhouette. Electronically Signed   By: Bretta Bang III M.D.   On: 12/28/2015 07:27   Dg Chest Portable 1 View  12/27/2015  CLINICAL DATA:  Post intubation. EXAM: PORTABLE CHEST 1 VIEW COMPARISON:  Earlier this day at 1918 hour FINDINGS: Endotracheal tube 4.9 cm from the carina. There is increased bibasilar atelectasis from prior, left greater than right. Heart is normal in size. No pulmonary edema large pleural effusion or pneumothorax. IMPRESSION: Endotracheal tube 4.9 cm from the carina. Increasing bibasilar atelectasis. Electronically Signed   By: Rubye Oaks M.D.   On: 12/27/2015 23:29   Dg Chest Portable 1 View  12/27/2015  CLINICAL DATA:  Shortness of breath, irregular heartbeat, congestion, hypertension, stroke, former smoker EXAM: PORTABLE CHEST 1 VIEW COMPARISON:  Portable exam 1918 hours compared to 12/23/2015 FINDINGS: Normal heart size with note of a coronary arterial stent. Atherosclerotic calcification aorta. Mediastinal contours and pulmonary vascularity normal. Bibasilar atelectasis. Lungs otherwise clear. No pleural effusion or pneumothorax. Bones demineralized. IMPRESSION: Bibasilar atelectasis. Electronically Signed   By: Ulyses Southward M.D.   On: 12/27/2015 19:25   Dg Chest Port 1 View  12/23/2015  CLINICAL DATA:  Pre op for right hip Fx surgery Hx of HTN, CVA, coronary artery disease, emphysema of lung, AAA EXAM: PORTABLE CHEST 1 VIEW  COMPARISON:  09/13/2015 FINDINGS: Patient rotated to the left. Midline trachea. Cardiomegaly accentuated by AP portable technique. Atherosclerosis in the transverse aorta. No pleural effusion or pneumothorax. Left infrahilar airspace disease. Clear right lung. IMPRESSION: Oblique portable radiograph demonstrating left infrahilar airspace disease. This could represent mild scarring, accentuated by low lung volumes and obliquity. If there are cardiopulmonary symptoms, recommend preop PA and lateral radiographs. If not, this could be re-evaluated with nonemergent outpatient PA and lateral films. Cardiomegaly without congestive failure. These results will be called to the ordering clinician or representative by the Radiologist Assistant, and communication documented in the PACS or zVision Dashboard. Electronically Signed   By: Jeronimo Greaves M.D.   On: 12/23/2015 10:18  Dg Hip Unilat With Pelvis 2-3 Views Left  12/22/2015  CLINICAL DATA:  Fall with left hip pain EXAM: DG HIP (WITH OR WITHOUT PELVIS) 2-3V LEFT COMPARISON:  None. FINDINGS: Comminuted nondisplaced intertrochanteric left proximal femur fracture. No additional fracture. No dislocation or appreciable arthropathy in the left hip. Diffuse osteopenia. Degenerative changes in the visualized lower lumbar spine. Vascular calcifications throughout the soft tissues. Prominent stool throughout the visualized colon. IMPRESSION: Comminuted nondisplaced intertrochanteric left proximal femur fracture. Diffuse osteopenia. Electronically Signed   By: Delbert Phenix M.D.   On: 12/22/2015 22:00     PERTINENT LAB RESULTS: CBC:  Recent Labs  01/01/16 0549 01/02/16 0505  WBC 9.2 8.9  HGB 10.0* 10.5*  HCT 29.6* 30.8*  PLT 254 270   CMET CMP     Component Value Date/Time   NA 136 12/30/2015 0456   K 3.3* 12/30/2015 0456   CL 104 12/30/2015 0456   CO2 20* 12/30/2015 0456   GLUCOSE 99 12/30/2015 0456   BUN 11 12/30/2015 0456   CREATININE 0.89 12/30/2015  0456   CALCIUM 8.5* 12/30/2015 0456   PROT 6.7 12/28/2015 0301   ALBUMIN 3.0* 12/28/2015 0301   AST 48* 12/28/2015 0301   ALT 20 12/28/2015 0301   ALKPHOS 62 12/28/2015 0301   BILITOT 1.8* 12/28/2015 0301   GFRNONAA >60 12/30/2015 0456   GFRAA >60 12/30/2015 0456    GFR Estimated Creatinine Clearance: 60.7 mL/min (by C-G formula based on Cr of 0.89). No results for input(s): LIPASE, AMYLASE in the last 72 hours. No results for input(s): CKTOTAL, CKMB, CKMBINDEX, TROPONINI in the last 72 hours. Invalid input(s): POCBNP No results for input(s): DDIMER in the last 72 hours. No results for input(s): HGBA1C in the last 72 hours. No results for input(s): CHOL, HDL, LDLCALC, TRIG, CHOLHDL, LDLDIRECT in the last 72 hours. No results for input(s): TSH, T4TOTAL, T3FREE, THYROIDAB in the last 72 hours.  Invalid input(s): FREET3 No results for input(s): VITAMINB12, FOLATE, FERRITIN, TIBC, IRON, RETICCTPCT in the last 72 hours. Coags: No results for input(s): INR in the last 72 hours.  Invalid input(s): PT Microbiology: Recent Results (from the past 240 hour(s))  Culture, blood (Routine x 2)     Status: None   Collection Time: 12/27/15  5:00 PM  Result Value Ref Range Status   Specimen Description BLOOD LEFT ANTECUBITAL  Final   Special Requests BOTTLES DRAWN AEROBIC AND ANAEROBIC 5CC  Final   Culture NO GROWTH 5 DAYS  Final   Report Status 01/01/2016 FINAL  Final  Urine culture     Status: Abnormal   Collection Time: 12/27/15  7:27 PM  Result Value Ref Range Status   Specimen Description URINE, CATHETERIZED  Final   Special Requests NONE  Final   Culture >=100,000 COLONIES/mL KLEBSIELLA PNEUMONIAE (A)  Final   Report Status 12/30/2015 FINAL  Final   Organism ID, Bacteria KLEBSIELLA PNEUMONIAE (A)  Final      Susceptibility   Klebsiella pneumoniae - MIC*    AMPICILLIN 16 RESISTANT Resistant     CEFAZOLIN <=4 SENSITIVE Sensitive     CEFTRIAXONE <=1 SENSITIVE Sensitive      CIPROFLOXACIN <=0.25 SENSITIVE Sensitive     GENTAMICIN <=1 SENSITIVE Sensitive     IMIPENEM <=0.25 SENSITIVE Sensitive     NITROFURANTOIN 64 INTERMEDIATE Intermediate     TRIMETH/SULFA <=20 SENSITIVE Sensitive     AMPICILLIN/SULBACTAM 4 SENSITIVE Sensitive     PIP/TAZO <=4 SENSITIVE Sensitive     * >=100,000 COLONIES/mL  KLEBSIELLA PNEUMONIAE  Culture, blood (Routine x 2)     Status: None   Collection Time: 12/27/15  7:35 PM  Result Value Ref Range Status   Specimen Description BLOOD RIGHT HAND  Final   Special Requests BOTTLES DRAWN AEROBIC AND ANAEROBIC 5CC  Final   Culture NO GROWTH 5 DAYS  Final   Report Status 01/01/2016 FINAL  Final  MRSA PCR Screening     Status: None   Collection Time: 12/28/15 12:12 AM  Result Value Ref Range Status   MRSA by PCR NEGATIVE NEGATIVE Final    Comment:        The GeneXpert MRSA Assay (FDA approved for NASAL specimens only), is one component of a comprehensive MRSA colonization surveillance program. It is not intended to diagnose MRSA infection nor to guide or monitor treatment for MRSA infections.      BRIEF HOSPITAL COURSE:  Brief Narrative: Patient is a 82 y.o. male with hx of CAD and of severe Aortic stenosis-(refused CABG/AVR) with recent fracture proximal intertrochanteric left femur fracture for which he was deemed a high risk surgical candidate and decided to pursue non operative treatment. He was readmitted on 4/20 with persistent hypoxia requiring intubation.He was thought to have submassive pulmonary embolism and started on IV heparin. CT angiogram chest was deferred due to renal function.Left femoral mobile clot confirmed by duplex on 04/21 and IVC filter placed the same day.He was subsequently extubated on 4/22. Post extubation, patient developed acute posterior inferior MI.Further hospital course was complicated hemoptysis while on IV heparin. He was subsequently transferred to [facility] hospitalist service on 4/24.Once hemoptysis  resolved-after d/w family/patient-patient was restarted on anticoagulation-with no further episodes of hemoptysis. He is currently stable to discharge back to SNF. See below for further details.  Hospital Course by Problem List: Acute respiratory failure with hypoxia:resolved required intubation on admission, extubated on 4/22.Etiology felt to be secondary to pulmonary embolism.Anticoagulation temporarily discontinued due to hemoptysis which has now resolved, he was restarted on IV Heparin on 4/24 and then transitioned to Eliquis today. Continues have to no further episodes of hemoptysis.  Active Problems: Presumed subacute massive PE with acute mobile DVT in left femoral vein: CT angiogram or VQ scan not done as likely will not change management-as patient had a renal failure on admission.Developed hemoptysis - anticoagulation was temporarily discontinued.IVC Filter placed on 4/21. Since Hemoptysis has resolved- family/patient desired to restart anticoagulation-atient was restarted on anticoagulation-with no further episodes of hemoptysis.Was started on IV Heparin on 4/24 and then transitioned to Eliquis today. Since no further episodes of hemoptysis-stable for discharge with close monitoring at SNF  Hemoptysis:resolved.See above regarding anticoagulation.  Acute infero-posterior WU:[id number]:Developed chest pain on 4/23, EKG consistent with acute posterior inferior MI. Seen by cardiology, patient continues to refuse all cardiac procedures. Spoke with Dr Clifton JamesMcalhany today-he recommends that we use ASA 81 mg along with Eliquis. Continue statin and metoprolol. Currently without any chest pain.  GNF:[id number]:Resolved with hydration and supportive care. Likely pre renal etiology.  Acute metabolic encephalopathy:Resolved-is awake and alert.Likely 2/2 to hypoxia, AKI.  Dyslipidemia:continue Statin  HQI:ONGEXCAD:known hx of CAD-has refused LHC/CABG. Per cards-to stop Plavix if anticoagulation being restarted. Continue ASA, statin and  metoprolol.  Severe aortic stenosis:has refused AVR in the past  BMW:[id number]:controlled-continue Metoprolol and Imdur  Hx of COPD:lungs clear-continue bronchodilators  Anemia: appears to be chronic, close to usual baseline, possibly Anemia of chronic disease  Hypothyroidism:continue current dosing of synthroid-TSH mildly elevated-will defer further adjustment to outpatient. Could have sick euthyroid syndrome-will  need to repeat TSH in a few weeks, if still increased-will require adjustment in dosing.   Recent proximal intertrochanteric left femur fracture:deemed a high risk surgical candidate and was decided to pursue non operative treatment. Reviewed last d/c summary-recommendations were to be no weightbearing in the left leg. Restarting anticoagulation today  GERD:continue PPI  Palliative Care:very poor long term prognoses-has severe AS, CAD-for which he has refused CABG/AVR/LHC. Now with left hip fracture-mostly bedbound and admitted with acute resp failure with hypoxia from presumed PE. Seen by palliative care-DNR in place-recommendations are to discharge back to SNF with palliative care follow up. Per family at bedside-patient had a foley catheter prior this admission, they would like to continue with it on discharge.   TODAY-DAY OF DISCHARGE:  Subjective:   Maxwell Copeland today has no headache,no chest abdominal pain,no new weakness tingling or numbness, feels much better wants to go home today.   Objective:   Blood pressure 130/71, pulse 87, temperature 98.7 F (37.1 C), temperature source Oral, resp. rate 18, height  (1.702 m), weight 65.9 kg (145 lb 4.5 oz), SpO2 98 %.  Intake/Output Summary (Last 24 hours) at 01/02/16 1209 Last data filed at 01/02/16 0932  Gross per 24 hour  Intake    840 ml  Output   1280 ml  Net   -440 ml   Filed Weights   12/31/15 0603 01/01/16 0545 01/02/16 0640  Weight: 64.375 kg (141 lb 14.7 oz) 65.2 kg (143 lb 11.8 oz) 65.9 kg (145 lb 4.5 oz)     Exam Awake Alert, Oriented *3, No new F.N deficits, Normal affect [place].AT,PERRAL Supple Neck,No JVD, No cervical lymphadenopathy appriciated.  Symmetrical Chest wall movement, Good air movement bilaterally, CTAB RRR,No Gallops,Rubs or new Murmurs, No Parasternal Heave +ve B.Sounds, Abd Soft, Non tender, No organomegaly appriciated, No rebound -guarding or rigidity. No Cyanosis, Clubbing or edema, No new Rash or bruise  DISCHARGE CONDITION: Stable  DISPOSITION: SNF  DISCHARGE INSTRUCTIONS:    Activity:  As tolerated with Full fall precautions use walker/cane & assistance as needed-but no weightbearing in the left leg. Restarting anticoagulation today  Get Medicines reviewed and adjusted: Please take all your medications with you for your next visit with your Primary MD  Please request your Primary MD to go over all hospital tests and procedure/radiological results at the follow up, please ask your Primary MD to get all Hospital records sent to his/her office.  If you experience worsening of your admission symptoms, develop shortness of breath, life threatening emergency, suicidal or homicidal thoughts you must seek medical attention immediately by calling 911 or calling your MD immediately  if symptoms less severe.  You must read complete instructions/literature along with all the possible adverse reactions/side effects for all the Medicines you take and that have been prescribed to you. Take any new Medicines after you have completely understood and accpet all the possible adverse reactions/side effects.   Do not drive when taking Pain medications.   Do not take more than prescribed Pain, Sleep and Anxiety Medications  Special Instructions: If you have smoked or chewed Tobacco  in the last 2 yrs please stop smoking, stop any regular Alcohol  and or any Recreational drug use.  Wear Seat belts while driving.  Please note  You were cared for by a hospitalist during your  hospital stay. Once you are discharged, your primary care physician will handle any further medical issues. Please note that NO REFILLS for any discharge medications will be authorized  once you are discharged, as it is imperative that you return to your primary care physician (or establish a relationship with a primary care physician if you do not have one) for your aftercare needs so that they can reassess your need for medications and monitor your lab values.  Diet recommendation: Heart Healthy diet-see below for further recommendations  Discharge Instructions    Call MD for:  difficulty breathing, headache or visual disturbances    Complete by:  As directed      Call MD for:  severe uncontrolled pain    Complete by:  As directed      Diet - low sodium heart healthy    Complete by:  As directed   Diet recommendations: Dysphagia 3 (mechanical soft);Thin liquid Liquids provided via: Cup;No straw Medication Administration: Whole meds with puree Supervision: Patient able to self feed;Intermittent supervision to cue for compensatory strategies Compensations: Slow rate;Small sips/bites Postural Changes and/or Swallow Maneuvers: Upright 30-60 min after meal;Seated upright 90 degrees     Increase activity slowly    Complete by:  As directed   no weightbearing in the left leg.           Follow-up Information    Follow up with [name], [name]. Schedule an appointment as soon as possible for a visit in 1 week.   Specialty:  Family Medicine   Contact information:   [address] [place] [address] [phone]      Total Time spent on discharge equals 45 minutes.  SignedJeoffrey Massed 01/02/2016 12:09 PM

## 2016-01-01 NOTE — Progress Notes (Addendum)
No chest pain No hemoptysis Only complaint is left hip pain with movement Per daughter at bedside-had Foley cath prior to admission-she wants to continue Explained stable for discharge Answered all questions

## 2016-01-01 NOTE — Discharge Instructions (Addendum)
Information on my medicine - ELIQUIS (apixaban)  Why was Eliquis prescribed for you? Eliquis was prescribed to treat blood clots that may have been found in the veins of your legs (deep vein thrombosis) or in your lungs (pulmonary embolism) and to reduce the risk of them occurring again.  What do You need to know about Eliquis ? The starting dose is 10 mg (two 5 mg tablets) taken TWICE daily for the FIRST SEVEN (7) DAYS, then on 01/08/16  the dose is reduced to ONE 5 mg tablet taken TWICE daily.  Eliquis may be taken with or without food.   Try to take the dose about the same time in the morning and in the evening. If you have difficulty swallowing the tablet whole please discuss with your pharmacist how to take the medication safely.  Take Eliquis exactly as prescribed and DO NOT stop taking Eliquis without talking to the doctor who prescribed the medication.  Stopping may increase your risk of developing a new blood clot.  Refill your prescription before you run out.  After discharge, you should have regular check-up appointments with your healthcare provider that is prescribing your Eliquis.    What do you do if you miss a dose? If a dose of ELIQUIS is not taken at the scheduled time, take it as soon as possible on the same day and twice-daily administration should be resumed. The dose should not be doubled to make up for a missed dose.  Important Safety Information A possible side effect of Eliquis is bleeding. You should call your healthcare provider right away if you experience any of the following: ? Bleeding from an injury or your nose that does not stop. ? Unusual colored urine (red or dark brown) or unusual colored stools (red or black). ? Unusual bruising for unknown reasons. ? A serious fall or if you hit your head (even if there is no bleeding).  Some medicines may interact with Eliquis and might increase your risk of bleeding or clotting while on Eliquis. To help avoid  this, consult your healthcare provider or pharmacist prior to using any new prescription or non-prescription medications, including herbals, vitamins, non-steroidal anti-inflammatory drugs (NSAIDs) and supplements.  This website has more information on Eliquis (apixaban): http://www.eliquis.com/eliquis/home

## 2016-01-01 NOTE — Care Management (Addendum)
[id number] [employer]BS Detailed letter of Discharge  And Non Discrimination and Accessibility Notice given to patient's daughter Junious DresserConnie at bedside.  Discussed discharge with patient , daughters Clayborne Danaatti and Junious DresserConnie . Pattie not wanting patient to discharge , feels that a SNF cannot care for her father. MD spoke with patient and both daughters .   Daughters want to appeal discharge . Explained Detailed Notice of discharge , and [id number] Noncovered  Continued Stay to patient and daughter Junious DresserConnie . Junious DresserConnie voiced understanding and signed both forms. Copies given to Fifth Streetonnie.   [facility] [phone] , aware and is faxing CM BCBS Detailed notice of discharge to give to patient and daughter. Daughter Junious DresserConnie aware.  Ronny FlurryHeather Bejamin Hackbart RN BSN [phone]

## 2016-01-01 NOTE — Progress Notes (Signed)
Speech Language Pathology Treatment: Dysphagia  Patient Details Name: Maxwell ShipperRichard C Copeland MRN: [id number] DOB: 09/10/1931 Today's Date: 01/01/2016 Time: [id number] SLP Time Calculation (min) (ACUTE ONLY): 10 min  Assessment / Plan / Recommendation Clinical Impression  Pt demonstrates ongoing dysphagia evidenced by throat clearing followed by delayed coughing with large sips of thin liquids. When instructed to take small cup sips rather than larger straw sips pt eliminates significant signs of aspiration. He is noted to have ongoing mild dysphonia as well. The pt reports that he does nto prefer straws and would rather drink from a cup when possible. SLP will f/u to reinforce precautions, though suspect pt will have ongoing improvement of airway protection with time.    HPI HPI: Pt is an 82 y.o. male with pMH severe CAD and aortic stenosis (he declined CABG/AVR in the past, diastolic dysfunction on last echo in 2015, and prior CVA who presented to ED 4/16  after having a mechanical fall at home, sustained a nondisplaced L proximal femur fracture. Pt d/c'd 4/18 and returned 4/20 with AMS and hypoxia and was intubated, extubated 4/22. Bedside swallow eval ordered to assess swallow function post-extubation.      SLP Plan  Continue with current plan of care     Recommendations  Diet recommendations: Regular;Thin liquid Liquids provided via: Cup;No straw Medication Administration: Whole meds with puree Supervision: Patient able to self feed;Full supervision/cueing for compensatory strategies Compensations: Slow rate;Small sips/bites Postural Changes and/or Swallow Maneuvers: Upright 30-60 min after meal;Seated upright 90 degrees             Plan: Continue with current plan of care     GO               Maxwell DittyBonnie Tavis Kring, MA CCC-SLP [phone]  Maxwell Copeland, Maxwell Copeland 01/01/2016, 2:33 PM

## 2016-01-01 NOTE — Evaluation (Signed)
Physical Therapy Evaluation Patient Details Name: Maxwell ShipperRichard C Rottinghaus MRN: [id number] DOB: 09/09/1931 Today's Date: 01/01/2016   History of Present Illness  82 y.o. male with hx of CAD and of severe Aortic stenosis-(refused CABG/AVR) with recent fracture proximal intertrochanteric left femur fracture for which he was deemed a high risk surgical candidate and decided to pursue non operative treatment. He was readmitted on 4/20 with persistent hypoxia requiring intubation.He was thought to have submassive pulmonary embolism and started on IV heparin. CT angiogram chest was deferred due to renal function.Left femoral mobile clot confirmed by duplex on 04/21 and IVC filter placed the same day.He was subsequently extubated on 4/22. Post extubation, patient developed acute posterior inferior MI.Further hospital course was complicated hemoptysis while on IV heparin.   Clinical Impression  Pt admitted with above diagnosis. Pt currently with functional limitations due to the deficits listed below (see PT Problem List). Pt will benefit from skilled PT to increase their independence and safety with mobility to allow discharge to SNF for further rehabilitation.        Follow Up Recommendations SNF;Supervision for mobility/OOB    Equipment Recommendations  None recommended by PT (to be addressed at next venue)    Recommendations for Other Services       Precautions / Restrictions Precautions Precautions: Fall Restrictions Weight Bearing Restrictions: Yes LLE Weight Bearing: Non weight bearing      Mobility  Bed Mobility Overal bed mobility: Needs Assistance Bed Mobility: Supine to Sit     Supine to sit: Mod assist     General bed mobility comments: assist provided at trunk and LLE to get to sitting, cues utilized for hand placement.   Transfers Overall transfer level: Needs assistance Equipment used: Rolling walker (2 wheeled) Transfers: Sit to/from Stand Sit to Stand: Min assist Stand  pivot transfers: Min assist       General transfer comment: Pt refusing to follow cues for hand placement with transfers, insisting on using both hands on rw. Reminder for NWB on LLE.   Ambulation/Gait                Stairs            Wheelchair Mobility    Modified Rankin (Stroke Patients Only)       Balance Overall balance assessment: Needs assistance Sitting-balance support: No upper extremity supported Sitting balance-Leahy Scale: Fair     Standing balance support: Bilateral upper extremity supported Standing balance-Leahy Scale: Poor Standing balance comment: using rw for support                             Pertinent Vitals/Pain Pain Assessment: 0-10 Pain Score: 2  Pain Location: Lt groin Pain Descriptors / Indicators: Sore Pain Intervention(s): Monitored during session    Home Living Family/patient expects to be discharged to:: Skilled nursing facility                      Prior Function Level of Independence: Independent         Comments: PTA pt reports living alone and remaining very active. States that he did not use any assistive device.      Hand Dominance        Extremity/Trunk Assessment   Upper Extremity Assessment: Overall WFL for tasks assessed           Lower Extremity Assessment: LLE deficits/detail RLE Deficits / Details: Pt needing min assist with moving  LLE with bed mobility        Communication   Communication: HOH  Cognition Arousal/Alertness: Awake/alert Behavior During Therapy: WFL for tasks assessed/performed Overall Cognitive Status: Within Functional Limits for tasks assessed           Safety/Judgement: Decreased awareness of safety          General Comments General comments (skin integrity, edema, etc.): Discussed with nursing medical concerns prior to PT evaluation, cleared for PT evaluation.     Exercises        Assessment/Plan    PT Assessment Patient needs  continued PT services  PT Diagnosis Difficulty walking;Generalized weakness   PT Problem List Decreased strength;Decreased activity tolerance;Decreased range of motion;Decreased balance;Decreased mobility;Decreased knowledge of use of DME;Decreased safety awareness  PT Treatment Interventions DME instruction;Gait training;Functional mobility training;Therapeutic activities;Therapeutic exercise;Balance training;Stair training   PT Goals (Current goals can be found in the Care Plan section) Acute Rehab PT Goals Patient Stated Goal: go somewhere for more rehab before eventually going back home.  PT Goal Formulation: With patient Time For Goal Achievement: 01/15/16 Potential to Achieve Goals: Good    Frequency Min 3X/week   Barriers to discharge Decreased caregiver support      Co-evaluation               End of Session Equipment Utilized During Treatment: Gait belt Activity Tolerance: Patient tolerated treatment well Patient left: in chair;with call bell/phone within reach;with family/visitor present Nurse Communication: Mobility status;Weight bearing status         Time: 0865-7846 PT Time Calculation (min) (ACUTE ONLY): 25 min   Charges:   PT Evaluation $PT Eval Moderate Complexity: 1 Procedure PT Treatments $Therapeutic Activity: 8-22 mins   PT G Codes:        Christiane Ha, PT, CSCS Pager [phone] Office [phone]  01/01/2016, 1:11 PM

## 2016-01-02 DIAGNOSIS — R1312 Dysphagia, oropharyngeal phase: Secondary | ICD-10-CM | POA: Diagnosis not present

## 2016-01-02 DIAGNOSIS — I350 Nonrheumatic aortic (valve) stenosis: Secondary | ICD-10-CM | POA: Diagnosis not present

## 2016-01-02 DIAGNOSIS — D649 Anemia, unspecified: Secondary | ICD-10-CM | POA: Diagnosis not present

## 2016-01-02 DIAGNOSIS — R262 Difficulty in walking, not elsewhere classified: Secondary | ICD-10-CM | POA: Diagnosis not present

## 2016-01-02 DIAGNOSIS — Z9181 History of falling: Secondary | ICD-10-CM | POA: Diagnosis not present

## 2016-01-02 DIAGNOSIS — M899 Disorder of bone, unspecified: Secondary | ICD-10-CM | POA: Diagnosis not present

## 2016-01-02 DIAGNOSIS — S72002A Fracture of unspecified part of neck of left femur, initial encounter for closed fracture: Secondary | ICD-10-CM | POA: Diagnosis not present

## 2016-01-02 DIAGNOSIS — J9601 Acute respiratory failure with hypoxia: Secondary | ICD-10-CM | POA: Diagnosis not present

## 2016-01-02 DIAGNOSIS — K5900 Constipation, unspecified: Secondary | ICD-10-CM | POA: Diagnosis not present

## 2016-01-02 DIAGNOSIS — R410 Disorientation, unspecified: Secondary | ICD-10-CM | POA: Diagnosis not present

## 2016-01-02 DIAGNOSIS — I714 Abdominal aortic aneurysm, without rupture: Secondary | ICD-10-CM | POA: Diagnosis not present

## 2016-01-02 DIAGNOSIS — R41841 Cognitive communication deficit: Secondary | ICD-10-CM | POA: Diagnosis not present

## 2016-01-02 DIAGNOSIS — E7800 Pure hypercholesterolemia, unspecified: Secondary | ICD-10-CM | POA: Diagnosis not present

## 2016-01-02 DIAGNOSIS — R339 Retention of urine, unspecified: Secondary | ICD-10-CM | POA: Diagnosis not present

## 2016-01-02 DIAGNOSIS — I10 Essential (primary) hypertension: Secondary | ICD-10-CM | POA: Diagnosis not present

## 2016-01-02 DIAGNOSIS — J449 Chronic obstructive pulmonary disease, unspecified: Secondary | ICD-10-CM | POA: Diagnosis not present

## 2016-01-02 DIAGNOSIS — S72145D Nondisplaced intertrochanteric fracture of left femur, subsequent encounter for closed fracture with routine healing: Secondary | ICD-10-CM | POA: Diagnosis not present

## 2016-01-02 DIAGNOSIS — I2129 ST elevation (STEMI) myocardial infarction involving other sites: Secondary | ICD-10-CM | POA: Diagnosis not present

## 2016-01-02 DIAGNOSIS — R05 Cough: Secondary | ICD-10-CM | POA: Diagnosis not present

## 2016-01-02 DIAGNOSIS — N183 Chronic kidney disease, stage 3 (moderate): Secondary | ICD-10-CM | POA: Diagnosis not present

## 2016-01-02 DIAGNOSIS — N390 Urinary tract infection, site not specified: Secondary | ICD-10-CM | POA: Diagnosis not present

## 2016-01-02 DIAGNOSIS — G9340 Encephalopathy, unspecified: Secondary | ICD-10-CM | POA: Diagnosis not present

## 2016-01-02 DIAGNOSIS — J029 Acute pharyngitis, unspecified: Secondary | ICD-10-CM | POA: Diagnosis not present

## 2016-01-02 DIAGNOSIS — M6281 Muscle weakness (generalized): Secondary | ICD-10-CM | POA: Diagnosis not present

## 2016-01-02 DIAGNOSIS — I2699 Other pulmonary embolism without acute cor pulmonale: Secondary | ICD-10-CM | POA: Diagnosis not present

## 2016-01-02 DIAGNOSIS — S72142A Displaced intertrochanteric fracture of left femur, initial encounter for closed fracture: Secondary | ICD-10-CM | POA: Diagnosis not present

## 2016-01-02 DIAGNOSIS — R109 Unspecified abdominal pain: Secondary | ICD-10-CM | POA: Diagnosis not present

## 2016-01-02 DIAGNOSIS — I82412 Acute embolism and thrombosis of left femoral vein: Secondary | ICD-10-CM | POA: Diagnosis not present

## 2016-01-02 DIAGNOSIS — R2689 Other abnormalities of gait and mobility: Secondary | ICD-10-CM | POA: Diagnosis not present

## 2016-01-02 LAB — CBC
HCT: 30.8 % — ABNORMAL LOW (ref 39.0–52.0)
HEMOGLOBIN: 10.5 g/dL — AB (ref 13.0–17.0)
MCH: 30.2 pg (ref 26.0–34.0)
MCHC: 34.1 g/dL (ref 30.0–36.0)
MCV: 88.5 fL (ref 78.0–100.0)
Platelets: 270 10*3/uL (ref 150–400)
RBC: 3.48 MIL/uL — AB (ref 4.22–5.81)
RDW: 13 % (ref 11.5–15.5)
WBC: 8.9 10*3/uL (ref 4.0–10.5)

## 2016-01-02 LAB — GLUCOSE, CAPILLARY
GLUCOSE-CAPILLARY: 94 mg/dL (ref 65–99)
GLUCOSE-CAPILLARY: 96 mg/dL (ref 65–99)

## 2016-01-02 MED ORDER — POLYETHYLENE GLYCOL 3350 17 G PO PACK
17.0000 g | PACK | Freq: Every day | ORAL | Status: DC
  Administered 2016-01-02: 17 g via ORAL
  Filled 2016-01-02 (×2): qty 1

## 2016-01-02 NOTE — Clinical Social Work Note (Signed)
Patient's daughter, Alexia Freestoneatty, stated family would prefer for patient to be [facility]. CSW informed patient's daughter that [facility] unable [facility] at time of discharge. CSW provided patient's daughter, Alexia Freestoneatty, [facility]. Per patient's daughter, Alexia Freestoneatty, patient's daughter to review offered facilities and contact CSW with final bed choice.  CSW to continue to follow and assist with discharge planning needs.  Marcelline Deistmily Kemoni Ortega, LCSW [phone] Orthopedics: [id number] Surgical: [phone]

## 2016-01-02 NOTE — Progress Notes (Signed)
Speech Language Pathology Treatment: Dysphagia  Patient Details Name: Maxwell Copeland MRN: [id number] DOB: Jun 01, 1932 Today's Date: 01/02/2016 Time: 5852-7782 SLP Time Calculation (min) (ACUTE ONLY): 19 min  Assessment / Plan / Recommendation Clinical Impression  Pt observed with Dys 3 diet with thin liquids. Pt utilized cup sip for all liquids and independently practiced small sips. No s/s aspiration noted this date. Complete oral clearance despite edentulous state. Pt reported preference for fish and hamburger for ease of mastication. Altered diet order to request chopped meats with sauce or gravy per pt preference. No further SLP services warranted at this time. Pt and family in agreement with POC and verbalize understanding of need to observe swallow precautions.    HPI HPI: Pt is an 82 y.o. male with [place] severe CAD and aortic stenosis (he declined CABG/AVR in the past, diastolic dysfunction on last echo in 2015, and prior CVA who presented to ED 4/16  after having a mechanical fall at home, sustained a nondisplaced L proximal femur fracture. Pt d/c'd 4/18 and returned 4/20 with AMS and hypoxia and was intubated, extubated 4/22. Bedside swallow eval ordered to assess swallow function post-extubation.      SLP Plan  All goals met     Recommendations  Diet recommendations: Dysphagia 3 (mechanical soft);Thin liquid Liquids provided via: Cup;No straw Medication Administration: Whole meds with puree Supervision: Patient able to self feed;Intermittent supervision to cue for compensatory strategies Compensations: Slow rate;Small sips/bites Postural Changes and/or Swallow Maneuvers: Upright 30-60 min after meal;Seated upright 90 degrees             Plan: All goals met     [place] MA, CCC-SLP Pager [phone] 01/02/2016, 8:55 AM

## 2016-01-02 NOTE — Progress Notes (Signed)
Patient seen and examined, discussed with daughter at bedside. No further hemoptysis. Exam-not in any distress, awake and alert Lungs-bilaterally clear Abdomen soft nontender Extremities-no edema  Remains stable for discharge. Please see discharge summary for details.

## 2016-01-02 NOTE — Clinical Social Work Note (Signed)
Patient will discharge today per MD order. Patient will discharge to: [facility] RN to call report prior to transportation to: [phone] Transportation: [facility] to be called at 3pm  CSW sent discharge summary to SNF for review.  Packet is complete.  RN, patient and family aware of discharge plans.  Vickii PennaGina Alson Mcpheeters, LCSW [phone]  5N1-9, 2S 15-16 and Psychiatric Service Line  Licensed Clinical Social Worker      CSW spoke with daughter Alexia Freestoneatty who states the daughters have chosen [facility] for [employer].  RN and RNCM updated.

## 2016-01-02 NOTE — Progress Notes (Signed)
Pt discharged to SNF via transport.  IVs and foley removed.  [facility]

## 2016-01-03 DIAGNOSIS — I2699 Other pulmonary embolism without acute cor pulmonale: Secondary | ICD-10-CM | POA: Diagnosis not present

## 2016-01-03 DIAGNOSIS — I714 Abdominal aortic aneurysm, without rupture: Secondary | ICD-10-CM | POA: Diagnosis not present

## 2016-01-03 DIAGNOSIS — K5900 Constipation, unspecified: Secondary | ICD-10-CM | POA: Diagnosis not present

## 2016-01-03 DIAGNOSIS — E7800 Pure hypercholesterolemia, unspecified: Secondary | ICD-10-CM | POA: Diagnosis not present

## 2016-01-03 DIAGNOSIS — I10 Essential (primary) hypertension: Secondary | ICD-10-CM | POA: Diagnosis not present

## 2016-01-03 DIAGNOSIS — R410 Disorientation, unspecified: Secondary | ICD-10-CM | POA: Diagnosis not present

## 2016-01-03 DIAGNOSIS — I350 Nonrheumatic aortic (valve) stenosis: Secondary | ICD-10-CM | POA: Diagnosis not present

## 2016-01-03 DIAGNOSIS — J449 Chronic obstructive pulmonary disease, unspecified: Secondary | ICD-10-CM | POA: Diagnosis not present

## 2016-01-03 DIAGNOSIS — S72002A Fracture of unspecified part of neck of left femur, initial encounter for closed fracture: Secondary | ICD-10-CM | POA: Diagnosis not present

## 2016-01-04 ENCOUNTER — Telehealth: Payer: Self-pay | Admitting: *Deleted

## 2016-01-04 NOTE — Telephone Encounter (Signed)
Pt was on TCM list admitted for acute respiratory failure w/hypoxia. D/C 01/02/16 sent to SNF...Raechel Chute/lmb

## 2016-01-06 DIAGNOSIS — R109 Unspecified abdominal pain: Secondary | ICD-10-CM | POA: Diagnosis not present

## 2016-01-07 DIAGNOSIS — M899 Disorder of bone, unspecified: Secondary | ICD-10-CM | POA: Diagnosis not present

## 2016-01-08 ENCOUNTER — Telehealth: Payer: Self-pay | Admitting: Family

## 2016-01-08 NOTE — Telephone Encounter (Signed)
Patient was admitted into [facility] today.  Is requesting immunization records to be faxed over to [phone].

## 2016-01-10 DIAGNOSIS — R05 Cough: Secondary | ICD-10-CM | POA: Diagnosis not present

## 2016-01-10 DIAGNOSIS — R339 Retention of urine, unspecified: Secondary | ICD-10-CM | POA: Diagnosis not present

## 2016-01-10 DIAGNOSIS — S72002A Fracture of unspecified part of neck of left femur, initial encounter for closed fracture: Secondary | ICD-10-CM | POA: Diagnosis not present

## 2016-01-14 DIAGNOSIS — R339 Retention of urine, unspecified: Secondary | ICD-10-CM | POA: Diagnosis not present

## 2016-01-14 DIAGNOSIS — J029 Acute pharyngitis, unspecified: Secondary | ICD-10-CM | POA: Diagnosis not present

## 2016-01-16 DIAGNOSIS — S72142A Displaced intertrochanteric fracture of left femur, initial encounter for closed fracture: Secondary | ICD-10-CM | POA: Diagnosis not present

## 2016-01-17 DIAGNOSIS — J449 Chronic obstructive pulmonary disease, unspecified: Secondary | ICD-10-CM | POA: Diagnosis not present

## 2016-01-17 DIAGNOSIS — D649 Anemia, unspecified: Secondary | ICD-10-CM | POA: Diagnosis not present

## 2016-01-17 DIAGNOSIS — N390 Urinary tract infection, site not specified: Secondary | ICD-10-CM | POA: Diagnosis not present

## 2016-01-17 DIAGNOSIS — N183 Chronic kidney disease, stage 3 (moderate): Secondary | ICD-10-CM | POA: Diagnosis not present

## 2016-01-17 NOTE — Telephone Encounter (Signed)
There are no immunizations on file.

## 2016-01-27 DIAGNOSIS — J441 Chronic obstructive pulmonary disease with (acute) exacerbation: Secondary | ICD-10-CM | POA: Diagnosis not present

## 2016-01-27 DIAGNOSIS — M25572 Pain in left ankle and joints of left foot: Secondary | ICD-10-CM | POA: Diagnosis not present

## 2016-01-28 DIAGNOSIS — R262 Difficulty in walking, not elsewhere classified: Secondary | ICD-10-CM | POA: Diagnosis not present

## 2016-01-28 DIAGNOSIS — Z9181 History of falling: Secondary | ICD-10-CM | POA: Diagnosis not present

## 2016-01-28 DIAGNOSIS — I2129 ST elevation (STEMI) myocardial infarction involving other sites: Secondary | ICD-10-CM | POA: Diagnosis not present

## 2016-01-28 DIAGNOSIS — I2699 Other pulmonary embolism without acute cor pulmonale: Secondary | ICD-10-CM | POA: Diagnosis not present

## 2016-01-28 DIAGNOSIS — M6281 Muscle weakness (generalized): Secondary | ICD-10-CM | POA: Diagnosis not present

## 2016-01-29 ENCOUNTER — Telehealth: Payer: Self-pay | Admitting: Family

## 2016-01-29 NOTE — Telephone Encounter (Signed)
[facility] called and needs last office note and last labs.    Phone [phone] ext [phone] Fax number [phone] Attn Velna HatchetSheila

## 2016-01-30 DIAGNOSIS — N138 Other obstructive and reflux uropathy: Secondary | ICD-10-CM | POA: Diagnosis not present

## 2016-01-30 DIAGNOSIS — N401 Enlarged prostate with lower urinary tract symptoms: Secondary | ICD-10-CM | POA: Diagnosis not present

## 2016-01-30 DIAGNOSIS — N3000 Acute cystitis without hematuria: Secondary | ICD-10-CM | POA: Diagnosis not present

## 2016-02-01 DIAGNOSIS — S72142D Displaced intertrochanteric fracture of left femur, subsequent encounter for closed fracture with routine healing: Secondary | ICD-10-CM | POA: Diagnosis not present

## 2016-02-01 NOTE — Telephone Encounter (Signed)
Office notes have been faxed over.

## 2016-02-04 DIAGNOSIS — J9811 Atelectasis: Secondary | ICD-10-CM | POA: Diagnosis not present

## 2016-02-13 DIAGNOSIS — N401 Enlarged prostate with lower urinary tract symptoms: Secondary | ICD-10-CM | POA: Diagnosis not present

## 2016-02-13 DIAGNOSIS — R3911 Hesitancy of micturition: Secondary | ICD-10-CM | POA: Diagnosis not present

## 2016-02-13 DIAGNOSIS — R338 Other retention of urine: Secondary | ICD-10-CM | POA: Diagnosis not present

## 2016-02-27 DIAGNOSIS — S72142D Displaced intertrochanteric fracture of left femur, subsequent encounter for closed fracture with routine healing: Secondary | ICD-10-CM | POA: Diagnosis not present

## 2016-03-14 DIAGNOSIS — R338 Other retention of urine: Secondary | ICD-10-CM | POA: Diagnosis not present

## 2016-03-14 DIAGNOSIS — N312 Flaccid neuropathic bladder, not elsewhere classified: Secondary | ICD-10-CM | POA: Diagnosis not present

## 2016-04-25 DIAGNOSIS — N401 Enlarged prostate with lower urinary tract symptoms: Secondary | ICD-10-CM | POA: Diagnosis not present

## 2016-04-25 DIAGNOSIS — R338 Other retention of urine: Secondary | ICD-10-CM | POA: Diagnosis not present

## 2016-04-25 DIAGNOSIS — N312 Flaccid neuropathic bladder, not elsewhere classified: Secondary | ICD-10-CM | POA: Diagnosis not present

## 2016-04-26 DIAGNOSIS — R339 Retention of urine, unspecified: Secondary | ICD-10-CM | POA: Diagnosis not present

## 2016-04-26 DIAGNOSIS — R011 Cardiac murmur, unspecified: Secondary | ICD-10-CM | POA: Diagnosis not present

## 2016-05-07 DIAGNOSIS — S72142D Displaced intertrochanteric fracture of left femur, subsequent encounter for closed fracture with routine healing: Secondary | ICD-10-CM | POA: Diagnosis not present

## 2016-05-09 DIAGNOSIS — R339 Retention of urine, unspecified: Secondary | ICD-10-CM | POA: Diagnosis not present

## 2016-05-09 DIAGNOSIS — N401 Enlarged prostate with lower urinary tract symptoms: Secondary | ICD-10-CM | POA: Diagnosis not present

## 2016-05-15 DIAGNOSIS — R339 Retention of urine, unspecified: Secondary | ICD-10-CM | POA: Diagnosis not present

## 2016-05-15 DIAGNOSIS — R300 Dysuria: Secondary | ICD-10-CM | POA: Diagnosis not present

## 2016-05-30 DIAGNOSIS — N401 Enlarged prostate with lower urinary tract symptoms: Secondary | ICD-10-CM | POA: Diagnosis not present

## 2016-05-30 DIAGNOSIS — Z87898 Personal history of other specified conditions: Secondary | ICD-10-CM | POA: Diagnosis not present

## 2016-06-09 DIAGNOSIS — R350 Frequency of micturition: Secondary | ICD-10-CM | POA: Diagnosis not present

## 2016-06-09 DIAGNOSIS — R8290 Unspecified abnormal findings in urine: Secondary | ICD-10-CM | POA: Diagnosis not present

## 2016-06-18 DIAGNOSIS — S72142D Displaced intertrochanteric fracture of left femur, subsequent encounter for closed fracture with routine healing: Secondary | ICD-10-CM | POA: Diagnosis not present

## 2016-07-25 DIAGNOSIS — N401 Enlarged prostate with lower urinary tract symptoms: Secondary | ICD-10-CM | POA: Diagnosis not present

## 2016-07-25 DIAGNOSIS — R351 Nocturia: Secondary | ICD-10-CM | POA: Diagnosis not present

## 2016-07-25 DIAGNOSIS — Z87898 Personal history of other specified conditions: Secondary | ICD-10-CM | POA: Diagnosis not present

## 2016-08-14 DIAGNOSIS — S72142B Displaced intertrochanteric fracture of left femur, initial encounter for open fracture type I or II: Secondary | ICD-10-CM | POA: Diagnosis not present

## 2017-09-16 DIAGNOSIS — N401 Enlarged prostate with lower urinary tract symptoms: Secondary | ICD-10-CM | POA: Diagnosis not present

## 2017-09-16 DIAGNOSIS — Z87898 Personal history of other specified conditions: Secondary | ICD-10-CM | POA: Diagnosis not present

## 2018-02-03 IMAGING — CR DG CHEST 1V PORT
2 series · 2 of 2 positions shown · non-contrast
Comparison: Earlier this day at 6267 hour

CLINICAL DATA: Post intubation.

EXAM:
PORTABLE CHEST 1 VIEW

[AP (1 of 2)]
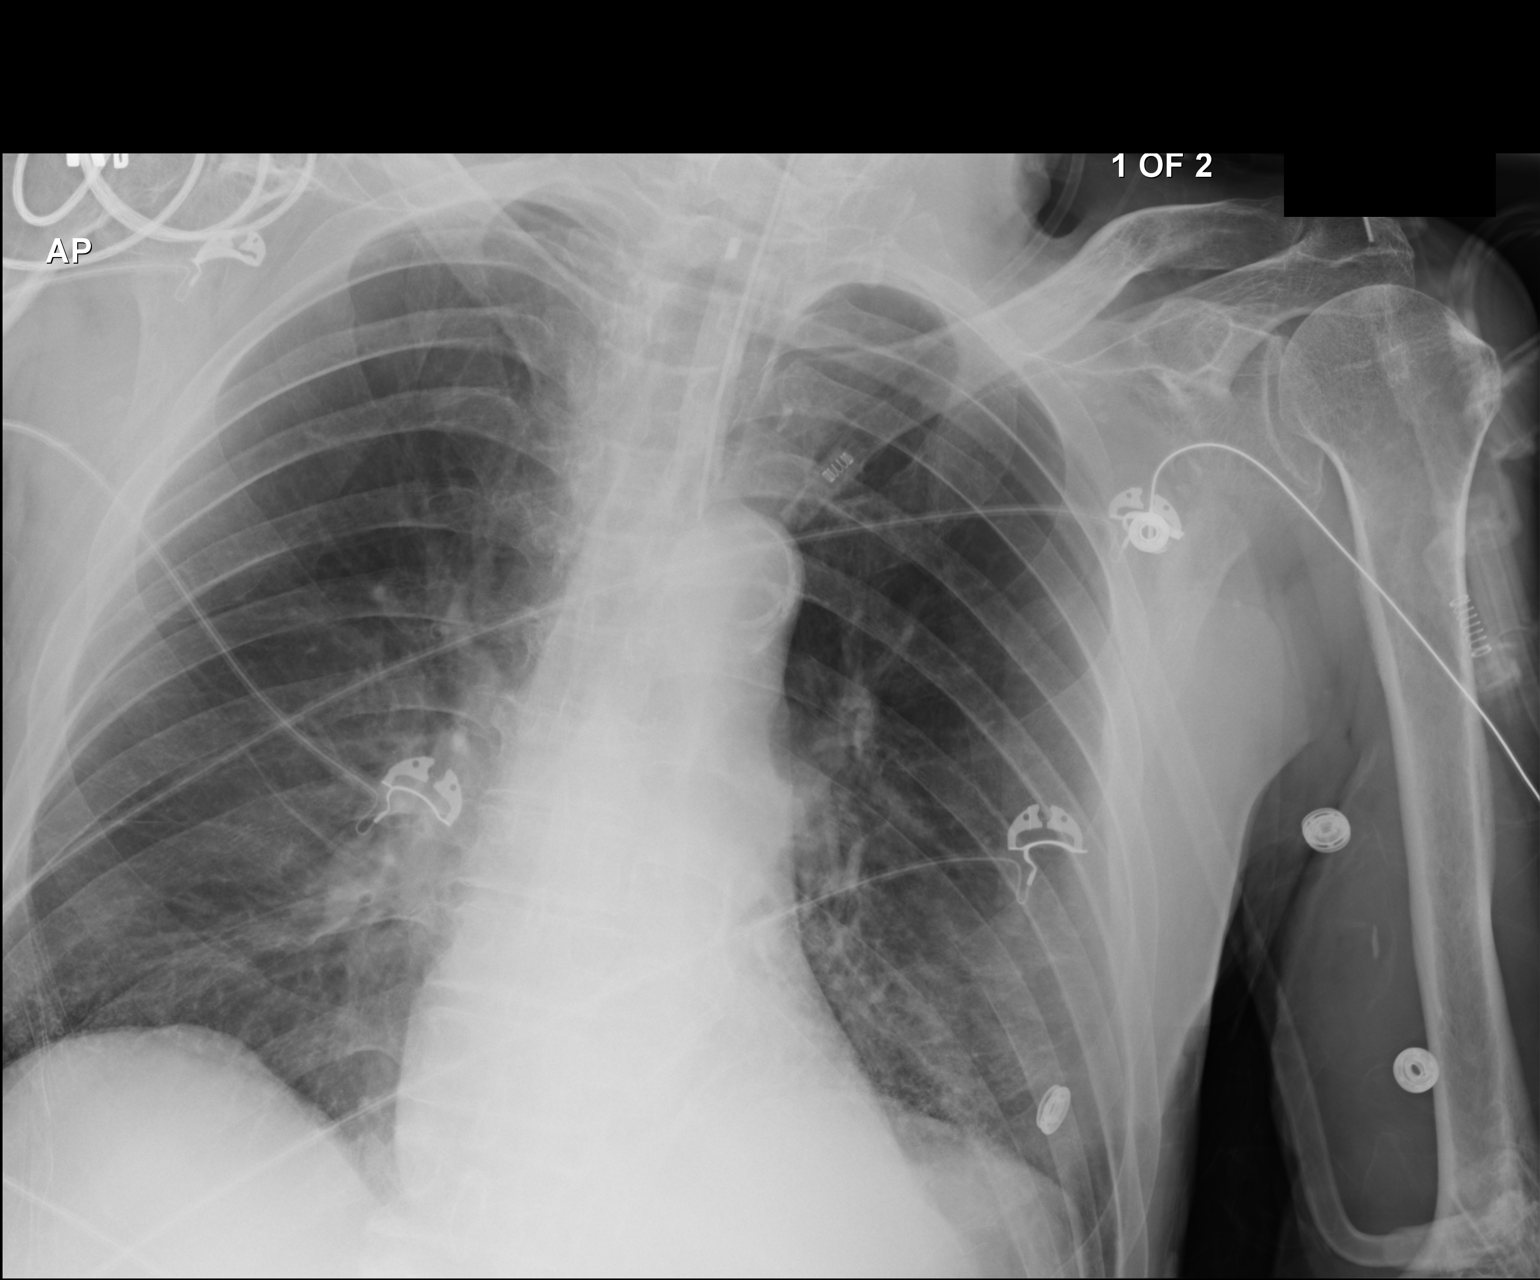

[AP (2 of 2)]
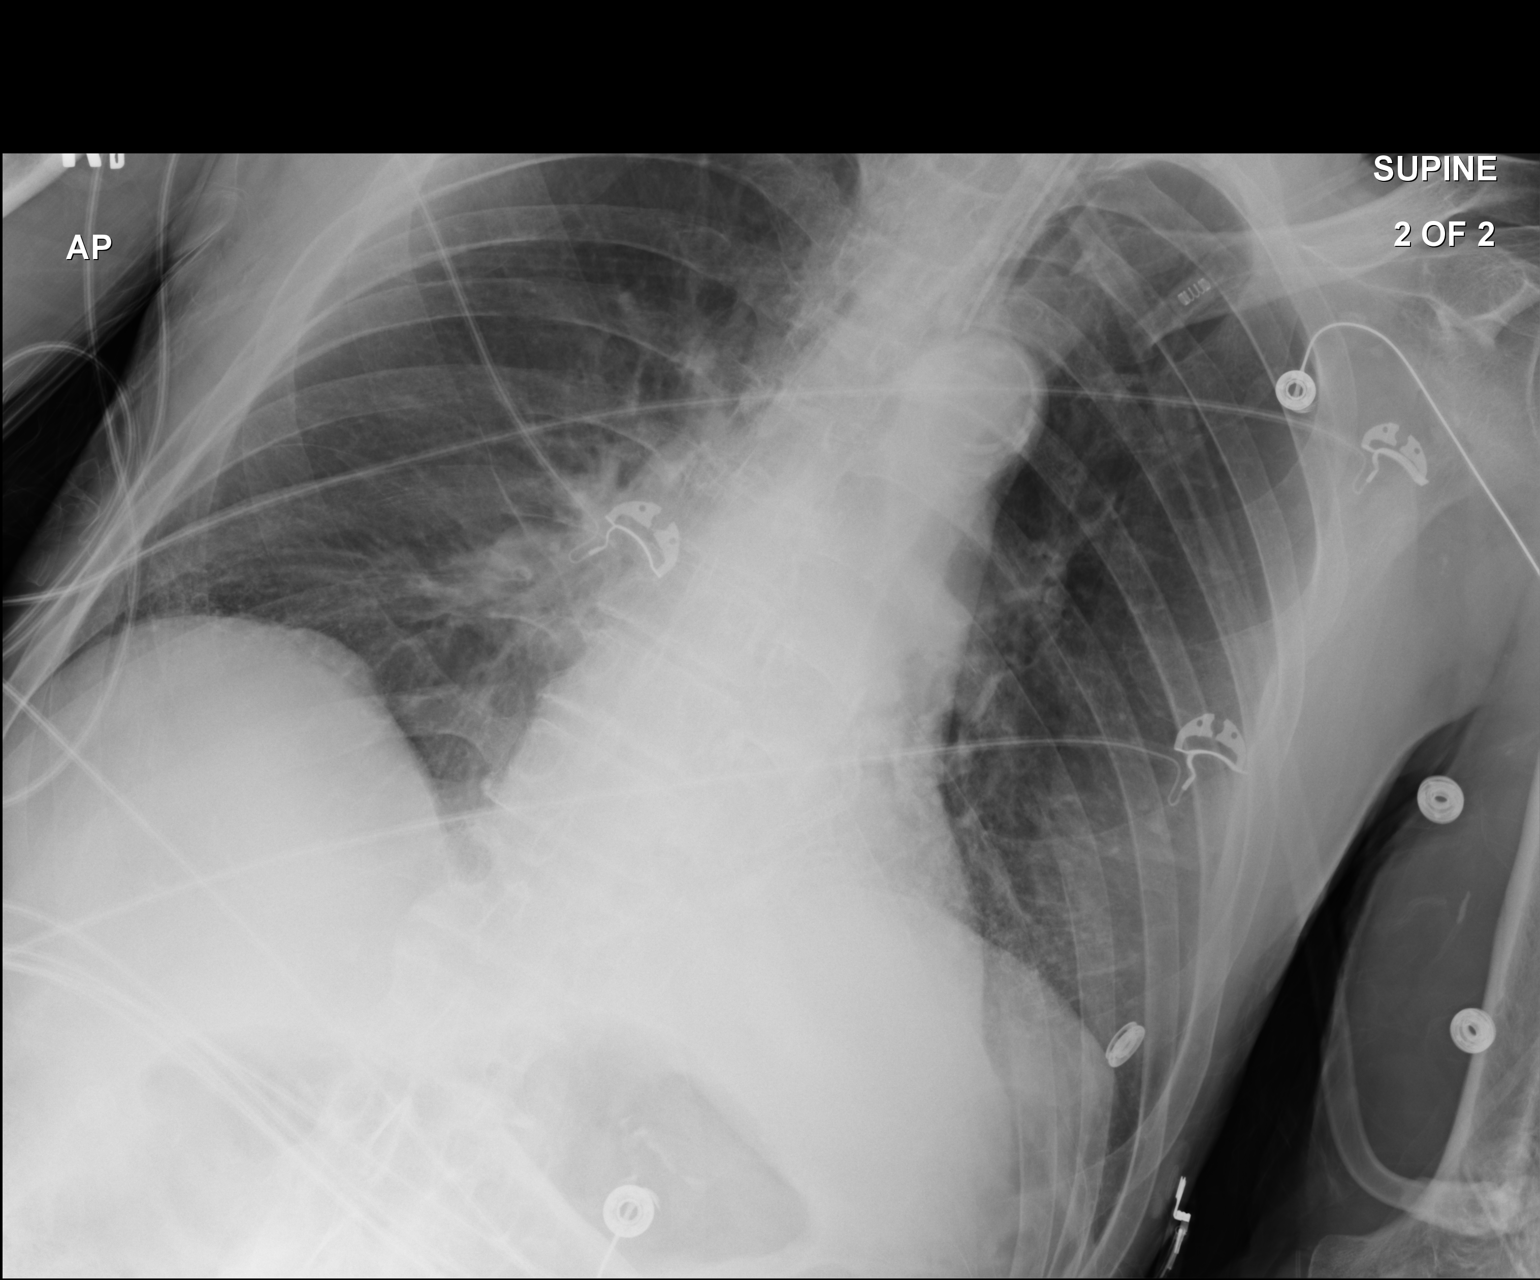

[2 of 2 positions shown; findings below may reference images not displayed]

FINDINGS: Endotracheal tube 4.9 cm from the carina. There is increased
bibasilar atelectasis from prior, left greater than right. Heart is
normal in size. No pulmonary edema large pleural effusion or
pneumothorax.
IMPRESSION: Endotracheal tube 4.9 cm from the carina.

Increasing bibasilar atelectasis.

## 2018-02-03 IMAGING — CR DG CHEST 1V PORT
1 series · 1 of 1 positions shown · non-contrast
Comparison: Portable exam 4046 hours compared to 12/23/2015

CLINICAL DATA: Shortness of breath, irregular heartbeat,
congestion, hypertension, stroke, former smoker

EXAM:
PORTABLE CHEST 1 VIEW

[AP]
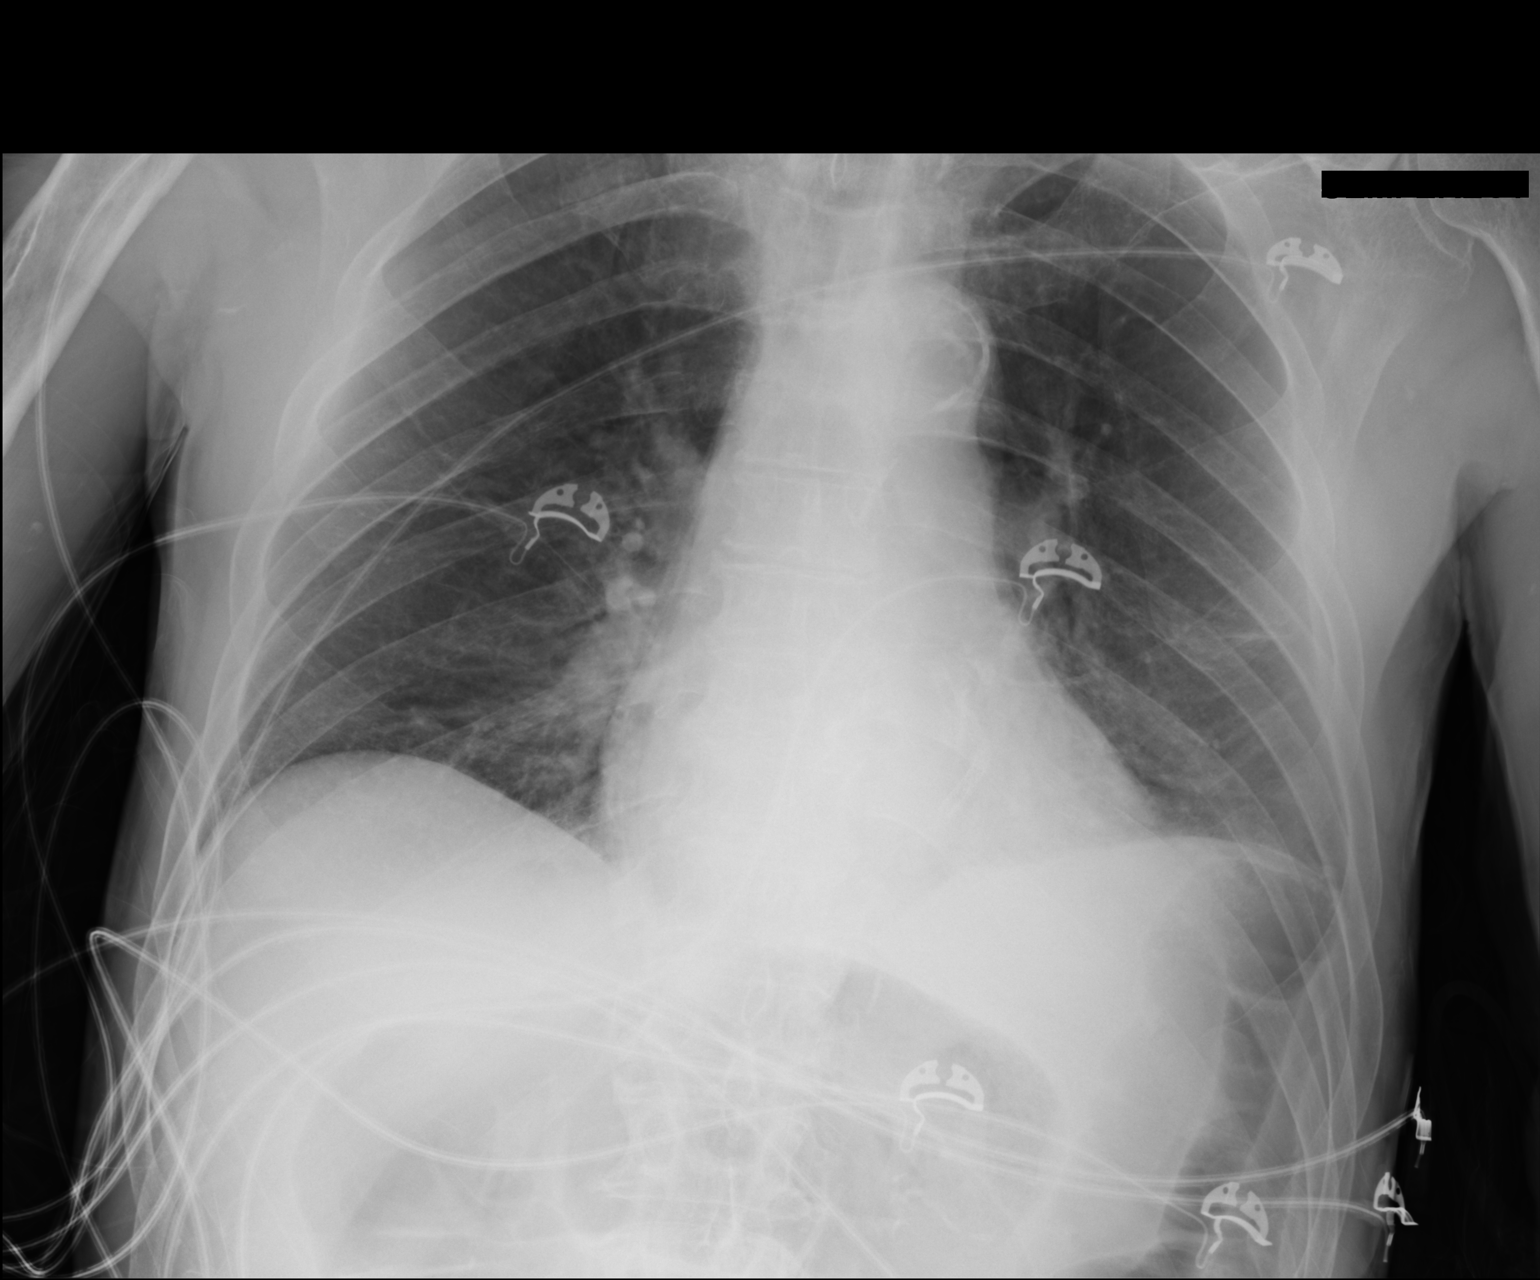

[1 of 1 positions shown; findings below may reference images not displayed]

FINDINGS: Normal heart size with note of a coronary arterial stent.

Atherosclerotic calcification aorta.

Mediastinal contours and pulmonary vascularity normal.

Bibasilar atelectasis.

Lungs otherwise clear.

No pleural effusion or pneumothorax.

Bones demineralized.
IMPRESSION: Bibasilar atelectasis.

## 2018-02-04 IMAGING — XA IR IVC FILTER PLMT / S&I /IMG GUID/MOD SED
3 series · 14 of 14 positions shown · IV contrast (IODINE)
Comparison: none

CLINICAL DATA: DVT, respiratory insufficiency, presumed acute PE.
Renal insufficiency.

EXAM:
INFERIOR VENACAVOGRAM
IVC FILTER PLACEMENT UNDER FLUOROSCOPY
FLUOROSCOPY TIME:  0.8 minutes, 27 uEymO DAP
TECHNIQUE: Patency of the right IJ vein was confirmed with ultrasound with
image documentation. An appropriate skin site was determined. Skin
site was marked, prepped with chlorhexidine, and draped using
maximum barrier technique. The region was infiltrated locally with
1% lidocaine. Under real-time ultrasound guidance, the right IJ vein
was accessed with a 21 gauge micropuncture needle; the needle tip
within the vein was confirmed with ultrasound image documentation.
The needle was exchanged over a 018 guidewire for a transitional
dilator, which allow advancement of the Benson wire into the IVC. A
long 6 French vascular sheath was placed for inferior venacavography
using CO2. This demonstrated no caval thrombus. Renal vein inflows
were evident.
The Denali IVC filter was advanced through the sheath and
successfully deployed under fluoroscopy at the L3 level. Followup
cavagram demonstrates stable filter position and no evident
complication. The sheath was removed and hemostasis achieved at the
site. No immediate complication.

[Series 1: body 4 care · 5 of 5 slices shown (1 of 3)]
[im 1/5]
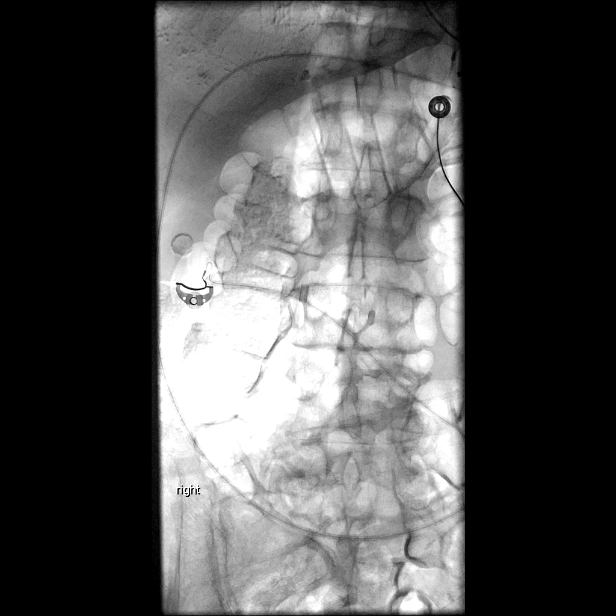
[im 2/5]
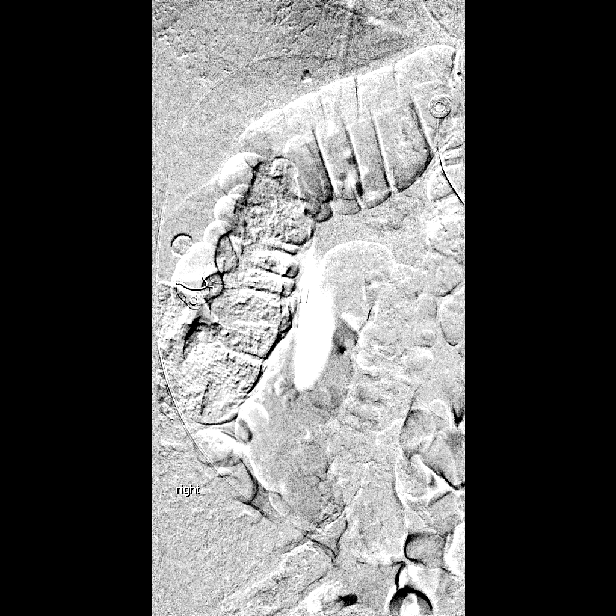
[im 3/5]
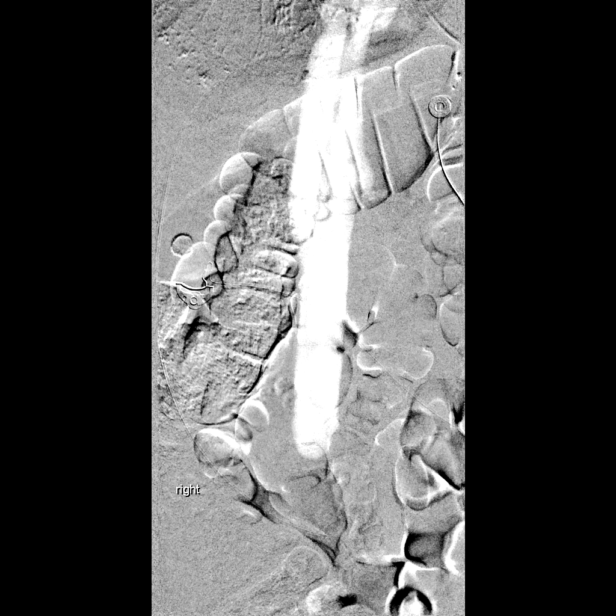
[im 4/5]
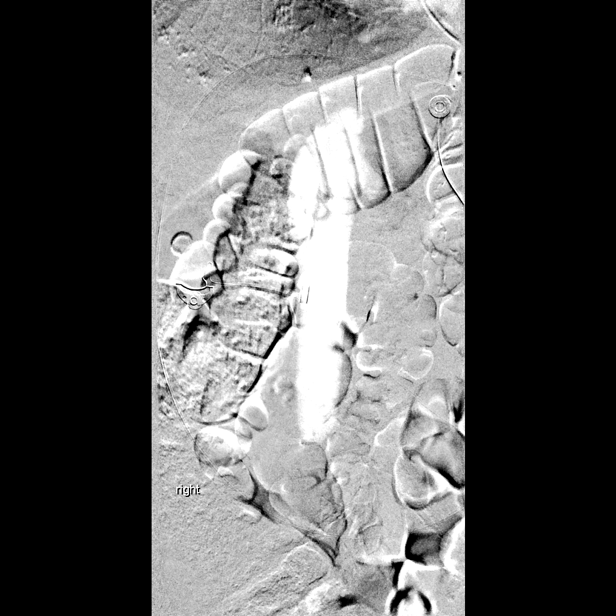
[im 5/5]
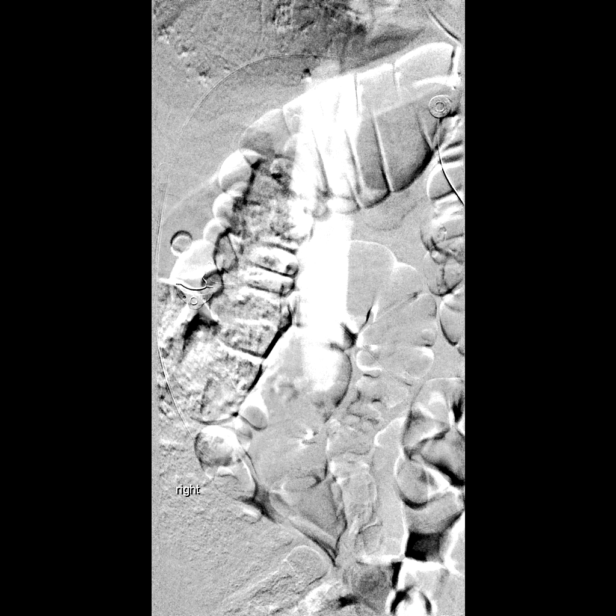

[Series 2: body 4 care · 4 of 4 slices shown (2 of 3)]
[im 1/4]
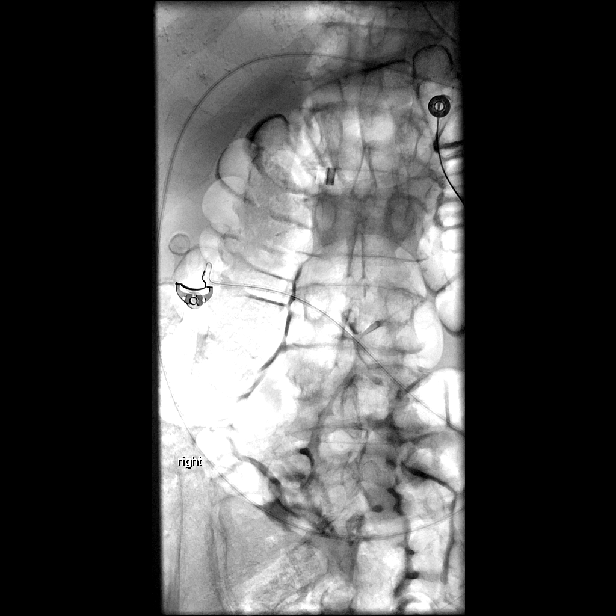
[im 2/4]
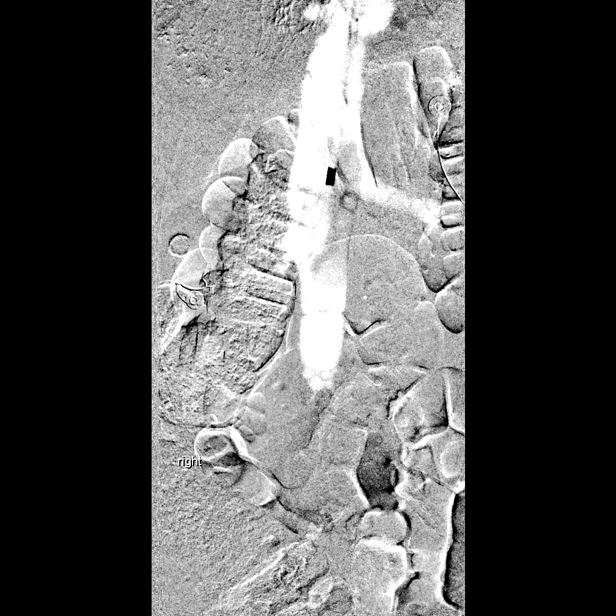
[im 3/4]
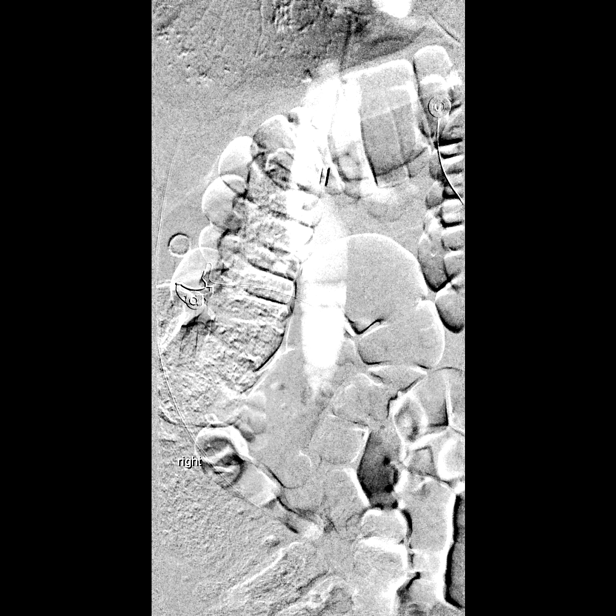
[im 4/4]
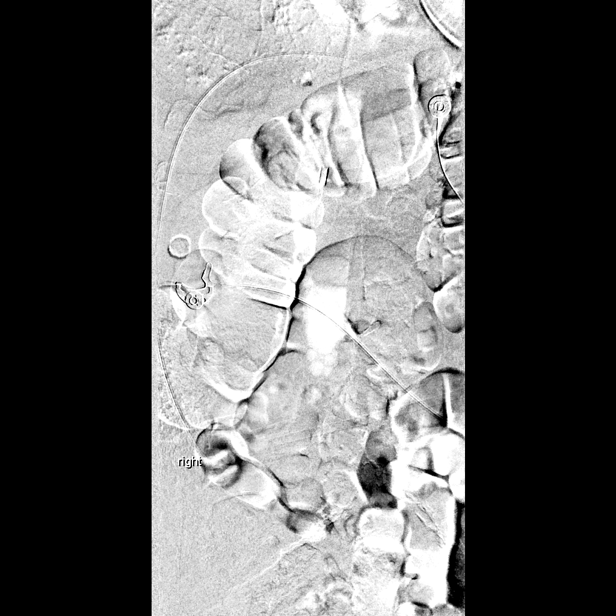

[Series 3: body 4 care · 5 of 5 slices shown (3 of 3)]
[im 1/5]
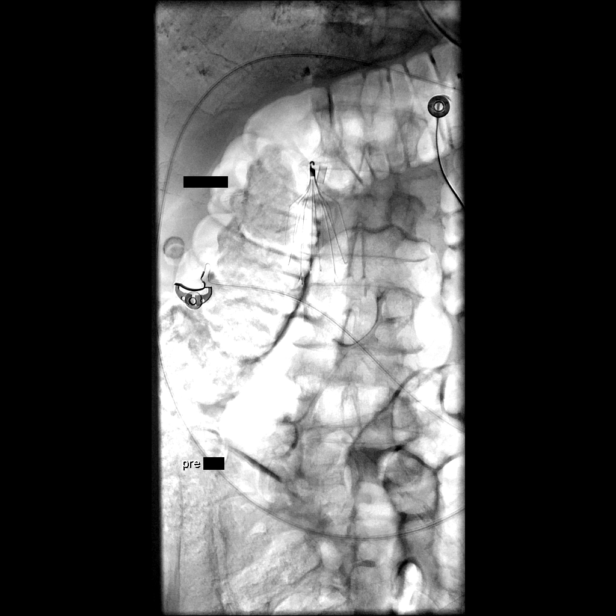
[im 2/5]
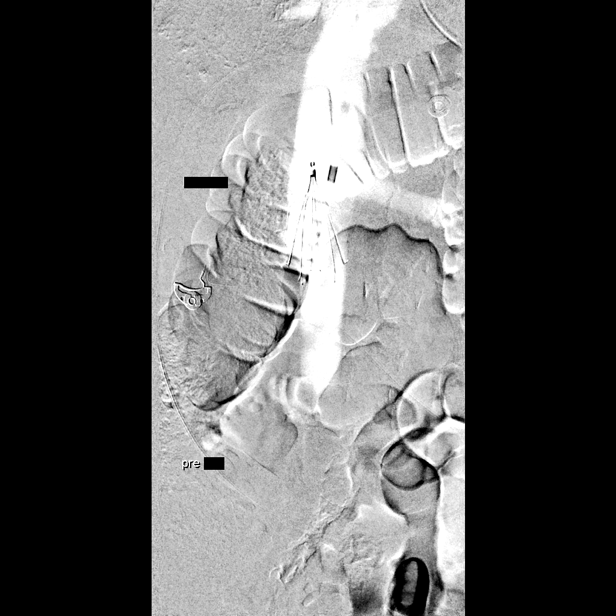
[im 3/5]
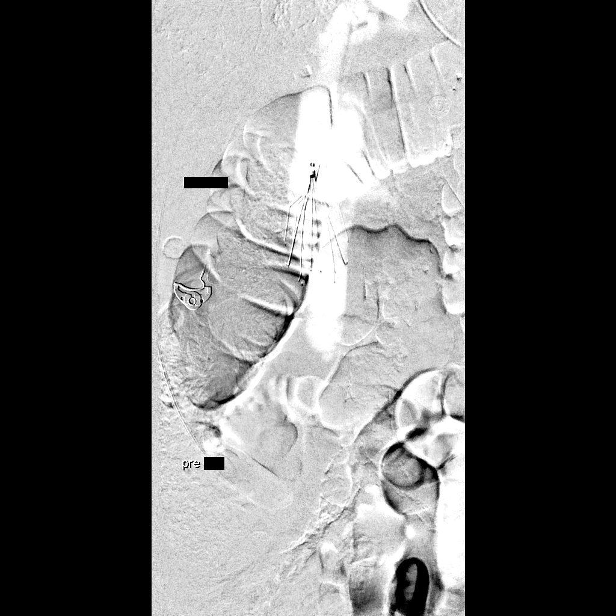
[im 4/5]
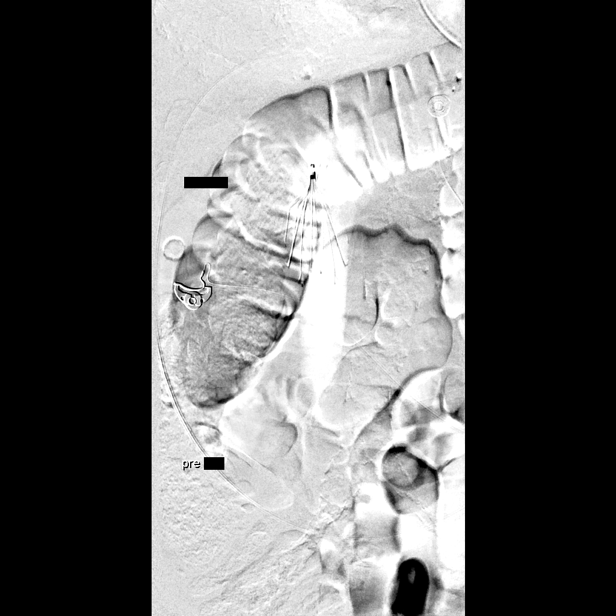
[im 5/5]
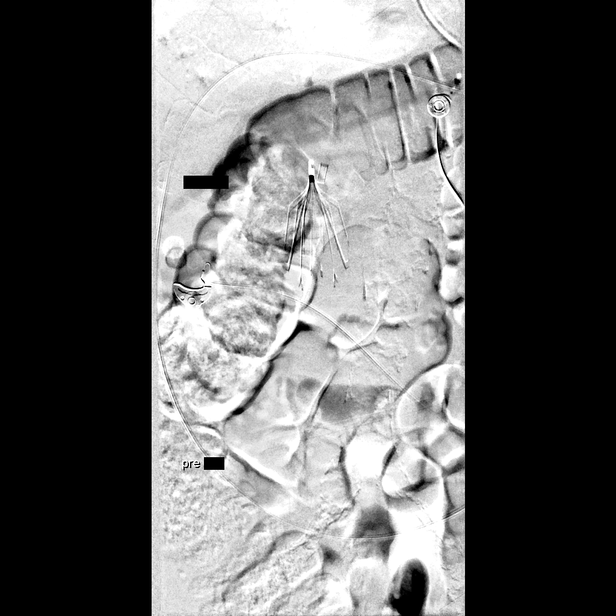

[14 of 14 positions shown; findings below may reference images not displayed]

IMPRESSION: 1. Normal IVC. No thrombus or significant anatomic variation.
2. Technically successful infrarenal IVC filter placement. This is a
retrievable model.

## 2018-09-27 DIAGNOSIS — Z87898 Personal history of other specified conditions: Secondary | ICD-10-CM | POA: Diagnosis not present

## 2018-09-27 DIAGNOSIS — N401 Enlarged prostate with lower urinary tract symptoms: Secondary | ICD-10-CM | POA: Diagnosis not present

## 2019-12-22 DIAGNOSIS — N401 Enlarged prostate with lower urinary tract symptoms: Secondary | ICD-10-CM | POA: Diagnosis not present

## 2019-12-22 DIAGNOSIS — N138 Other obstructive and reflux uropathy: Secondary | ICD-10-CM | POA: Diagnosis not present

## 2020-11-28 DIAGNOSIS — I10 Essential (primary) hypertension: Secondary | ICD-10-CM | POA: Diagnosis not present

## 2020-11-28 DIAGNOSIS — I350 Nonrheumatic aortic (valve) stenosis: Secondary | ICD-10-CM | POA: Diagnosis not present

## 2020-11-28 DIAGNOSIS — I639 Cerebral infarction, unspecified: Secondary | ICD-10-CM | POA: Diagnosis not present

## 2020-11-28 DIAGNOSIS — I480 Paroxysmal atrial fibrillation: Secondary | ICD-10-CM | POA: Diagnosis not present

## 2020-12-20 DIAGNOSIS — N400 Enlarged prostate without lower urinary tract symptoms: Secondary | ICD-10-CM | POA: Diagnosis not present

## 2021-01-15 DIAGNOSIS — N138 Other obstructive and reflux uropathy: Secondary | ICD-10-CM | POA: Diagnosis not present

## 2021-01-15 DIAGNOSIS — N401 Enlarged prostate with lower urinary tract symptoms: Secondary | ICD-10-CM | POA: Diagnosis not present

## 2021-01-22 DIAGNOSIS — R9431 Abnormal electrocardiogram [ECG] [EKG]: Secondary | ICD-10-CM | POA: Diagnosis not present

## 2021-01-22 DIAGNOSIS — R059 Cough, unspecified: Secondary | ICD-10-CM | POA: Diagnosis not present

## 2021-01-22 DIAGNOSIS — I5042 Chronic combined systolic (congestive) and diastolic (congestive) heart failure: Secondary | ICD-10-CM | POA: Diagnosis not present

## 2021-01-22 DIAGNOSIS — E871 Hypo-osmolality and hyponatremia: Secondary | ICD-10-CM | POA: Diagnosis not present

## 2021-01-22 DIAGNOSIS — I10 Essential (primary) hypertension: Secondary | ICD-10-CM | POA: Diagnosis not present

## 2021-01-22 DIAGNOSIS — F0391 Unspecified dementia with behavioral disturbance: Secondary | ICD-10-CM | POA: Diagnosis not present

## 2021-01-22 DIAGNOSIS — I2510 Atherosclerotic heart disease of native coronary artery without angina pectoris: Secondary | ICD-10-CM | POA: Diagnosis not present

## 2021-01-22 DIAGNOSIS — Z86711 Personal history of pulmonary embolism: Secondary | ICD-10-CM | POA: Diagnosis not present

## 2021-01-22 DIAGNOSIS — I214 Non-ST elevation (NSTEMI) myocardial infarction: Secondary | ICD-10-CM | POA: Diagnosis not present

## 2021-01-22 DIAGNOSIS — I110 Hypertensive heart disease with heart failure: Secondary | ICD-10-CM | POA: Diagnosis not present

## 2021-01-22 DIAGNOSIS — Z66 Do not resuscitate: Secondary | ICD-10-CM | POA: Diagnosis not present

## 2021-01-22 DIAGNOSIS — K219 Gastro-esophageal reflux disease without esophagitis: Secondary | ICD-10-CM | POA: Diagnosis not present

## 2021-01-22 DIAGNOSIS — Z515 Encounter for palliative care: Secondary | ICD-10-CM | POA: Diagnosis not present

## 2021-01-22 DIAGNOSIS — E039 Hypothyroidism, unspecified: Secondary | ICD-10-CM | POA: Diagnosis not present

## 2021-01-22 DIAGNOSIS — I130 Hypertensive heart and chronic kidney disease with heart failure and stage 1 through stage 4 chronic kidney disease, or unspecified chronic kidney disease: Secondary | ICD-10-CM | POA: Diagnosis not present

## 2021-01-22 DIAGNOSIS — F0151 Vascular dementia with behavioral disturbance: Secondary | ICD-10-CM | POA: Diagnosis not present

## 2021-01-22 DIAGNOSIS — R7989 Other specified abnormal findings of blood chemistry: Secondary | ICD-10-CM | POA: Diagnosis not present

## 2021-01-22 DIAGNOSIS — J449 Chronic obstructive pulmonary disease, unspecified: Secondary | ICD-10-CM | POA: Diagnosis not present

## 2021-01-22 DIAGNOSIS — G9341 Metabolic encephalopathy: Secondary | ICD-10-CM | POA: Diagnosis not present

## 2021-01-22 DIAGNOSIS — I5020 Unspecified systolic (congestive) heart failure: Secondary | ICD-10-CM | POA: Diagnosis not present

## 2021-01-22 DIAGNOSIS — I5082 Biventricular heart failure: Secondary | ICD-10-CM | POA: Diagnosis not present

## 2021-01-22 DIAGNOSIS — F0390 Unspecified dementia without behavioral disturbance: Secondary | ICD-10-CM | POA: Diagnosis not present

## 2021-01-22 DIAGNOSIS — F05 Delirium due to known physiological condition: Secondary | ICD-10-CM | POA: Diagnosis not present

## 2021-01-22 DIAGNOSIS — N400 Enlarged prostate without lower urinary tract symptoms: Secondary | ICD-10-CM | POA: Diagnosis not present

## 2021-01-22 DIAGNOSIS — Z8616 Personal history of COVID-19: Secondary | ICD-10-CM | POA: Diagnosis not present

## 2021-01-22 DIAGNOSIS — I509 Heart failure, unspecified: Secondary | ICD-10-CM | POA: Diagnosis not present

## 2021-03-08 DEATH — deceased
